# Patient Record
Sex: Male | Born: 1935 | Race: White | Hispanic: No | Marital: Married | Smoking: Former smoker
Health system: Southern US, Community
[De-identification: ages and names within clinical notes are randomized; demographics above are authoritative.]

## PROBLEM LIST (undated history)

## (undated) DIAGNOSIS — I10 Essential (primary) hypertension: Secondary | ICD-10-CM

## (undated) DIAGNOSIS — E119 Type 2 diabetes mellitus without complications: Secondary | ICD-10-CM

## (undated) DIAGNOSIS — Z950 Presence of cardiac pacemaker: Secondary | ICD-10-CM

## (undated) DIAGNOSIS — Z9289 Personal history of other medical treatment: Secondary | ICD-10-CM

## (undated) DIAGNOSIS — N200 Calculus of kidney: Secondary | ICD-10-CM

## (undated) DIAGNOSIS — M1990 Unspecified osteoarthritis, unspecified site: Secondary | ICD-10-CM

## (undated) DIAGNOSIS — F0390 Unspecified dementia without behavioral disturbance: Secondary | ICD-10-CM

## (undated) DIAGNOSIS — Z8739 Personal history of other diseases of the musculoskeletal system and connective tissue: Secondary | ICD-10-CM

## (undated) HISTORY — PX: CYSTOSCOPY W/ STONE MANIPULATION: SHX1427

---

## 2003-08-21 ENCOUNTER — Encounter: Admission: RE | Admit: 2003-08-21 | Discharge: 2003-08-21 | Payer: Self-pay | Admitting: Rheumatology

## 2008-01-14 ENCOUNTER — Emergency Department (HOSPITAL_COMMUNITY): Admission: EM | Admit: 2008-01-14 | Discharge: 2008-01-14 | Payer: Self-pay | Admitting: Emergency Medicine

## 2009-07-08 ENCOUNTER — Encounter: Admission: RE | Admit: 2009-07-08 | Discharge: 2009-07-08 | Payer: Self-pay | Admitting: Family Medicine

## 2010-12-29 LAB — POCT CARDIAC MARKERS
CKMB, poc: <1 — ABNORMAL LOW
Myoglobin, poc: 135
Troponin i, poc: <0.05

## 2010-12-29 LAB — GLUCOSE, CAPILLARY: Glucose-Capillary: 182 — ABNORMAL HIGH

## 2013-03-29 HISTORY — PX: CATARACT EXTRACTION: SUR2

## 2013-10-30 ENCOUNTER — Ambulatory Visit (INDEPENDENT_AMBULATORY_CARE_PROVIDER_SITE_OTHER): Payer: Commercial Managed Care - HMO | Admitting: Cardiovascular Disease

## 2013-10-30 ENCOUNTER — Encounter: Payer: Self-pay | Admitting: Cardiovascular Disease

## 2013-10-30 VITALS — BP 132/70 | HR 80 | Ht 67.0 in | Wt 165.6 lb

## 2013-10-30 DIAGNOSIS — I951 Orthostatic hypotension: Secondary | ICD-10-CM | POA: Insufficient documentation

## 2013-10-30 DIAGNOSIS — Z01810 Encounter for preprocedural cardiovascular examination: Secondary | ICD-10-CM | POA: Insufficient documentation

## 2013-10-30 DIAGNOSIS — E1029 Type 1 diabetes mellitus with other diabetic kidney complication: Secondary | ICD-10-CM | POA: Insufficient documentation

## 2013-10-30 DIAGNOSIS — I4510 Unspecified right bundle-branch block: Secondary | ICD-10-CM

## 2013-10-30 NOTE — Patient Instructions (Signed)
Your physician recommends that you schedule a follow-up appointment in: AS NEEDED  Your physician recommends that you continue on your current medications as directed. Please refer to the Current Medication list given to you today.  

## 2013-10-30 NOTE — Assessment & Plan Note (Signed)
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

## 2013-10-30 NOTE — Assessment & Plan Note (Signed)
Discussed low carb diet.  Target hemoglobin A1c is 6.5 or less.  Continue current medications. Follow Cr  Continue ACE

## 2013-10-30 NOTE — Assessment & Plan Note (Signed)
No significant bradycardia  No CAD or chest pain  Clear to have low risk cataract surgery

## 2013-10-30 NOTE — Assessment & Plan Note (Signed)
Mild avoid dehydration Slow changes in position Related to longstanding DM  No other evidence of dysautonomia

## 2013-10-30 NOTE — Progress Notes (Signed)
Patient ID: Noah Adkins, male   DOB: December 30, 1926, 80 y.o.   MRN: [id number]   80 yo referred by Dr Linton [facility] for clearance cataract surgery.  Diabetic with good control  A1c 5.6  CRF with Cr 1.6  History of HTN  No history of CAD.  Indicates "bradycardia"  Intolerant to multiple calcium blockers and beta blockers.  ? Pulse recorded from last office visit 74 on 09/04/13  He has some mild postural symptoms in the am when he first gets up.  On no AV nodal blocker drugs of significance  Ambulation limited by left knee pain  No chest pain dyspnea or syncope.  He has blurry vision and needs cataract removal.        ROS: Denies fever, malais, weight loss, blurry vision, decreased visual acuity, cough, sputum, SOB, hemoptysis, pleuritic pain, palpitaitons, heartburn, abdominal pain, melena, lower extremity edema, claudication, or rash.  All other systems reviewed and negative   General: Affect appropriate Healthy:  appears stated age HEENT: normal Neck supple with no adenopathy JVP normal no bruits no thyromegaly Lungs clear with no wheezing and good diaphragmatic motion Heart:  S1/S2 no murmur,rub, gallop or click PMI normal Abdomen: benighn, BS positve, no tenderness, no AAA no bruit.  No HSM or HJR Distal pulses intact with no bruits No edema Neuro non-focal Skin warm and dry No muscular weakness  Medications No current outpatient prescriptions on file.   No current facility-administered medications for this visit.    Allergies Review of patient's allergies indicates not on file.  Family History: No family history on file.  Social History: History   Social History  . Marital Status: Married    Spouse Name: N/A    Number of Children: N/A  . Years of Education: N/A   Occupational History  . Not on file.   Social History Main Topics  . Smoking status: Not on file  . Smokeless tobacco: Not on file  . Alcohol Use: Not on file  . Drug Use: Not on file  . Sexual Activity:  Not on file   Other Topics Concern  . Not on file   Social History Narrative  . No narrative on file    Electrocardiogram:SR rate 80 PR 210  RBBB LAFB   Assessment and Plan

## 2014-05-06 DIAGNOSIS — E780 Pure hypercholesterolemia: Secondary | ICD-10-CM | POA: Diagnosis not present

## 2014-05-14 DIAGNOSIS — E780 Pure hypercholesterolemia: Secondary | ICD-10-CM | POA: Diagnosis not present

## 2014-05-14 DIAGNOSIS — M170 Bilateral primary osteoarthritis of knee: Secondary | ICD-10-CM | POA: Diagnosis not present

## 2014-05-14 DIAGNOSIS — Z1389 Encounter for screening for other disorder: Secondary | ICD-10-CM | POA: Diagnosis not present

## 2014-09-11 DIAGNOSIS — G4700 Insomnia, unspecified: Secondary | ICD-10-CM | POA: Diagnosis not present

## 2014-09-11 DIAGNOSIS — E1122 Type 2 diabetes mellitus with diabetic chronic kidney disease: Secondary | ICD-10-CM | POA: Diagnosis not present

## 2014-09-11 DIAGNOSIS — E11329 Type 2 diabetes mellitus with mild nonproliferative diabetic retinopathy without macular edema: Secondary | ICD-10-CM | POA: Diagnosis not present

## 2014-09-11 DIAGNOSIS — J309 Allergic rhinitis, unspecified: Secondary | ICD-10-CM | POA: Diagnosis not present

## 2014-09-11 DIAGNOSIS — N183 Chronic kidney disease, stage 3 (moderate): Secondary | ICD-10-CM | POA: Diagnosis not present

## 2014-09-11 DIAGNOSIS — M170 Bilateral primary osteoarthritis of knee: Secondary | ICD-10-CM | POA: Diagnosis not present

## 2014-09-11 DIAGNOSIS — I129 Hypertensive chronic kidney disease with stage 1 through stage 4 chronic kidney disease, or unspecified chronic kidney disease: Secondary | ICD-10-CM | POA: Diagnosis not present

## 2014-09-11 DIAGNOSIS — N3281 Overactive bladder: Secondary | ICD-10-CM | POA: Diagnosis not present

## 2015-03-14 DIAGNOSIS — I10 Essential (primary) hypertension: Secondary | ICD-10-CM | POA: Diagnosis not present

## 2015-03-14 DIAGNOSIS — E113292 Type 2 diabetes mellitus with mild nonproliferative diabetic retinopathy without macular edema, left eye: Secondary | ICD-10-CM | POA: Diagnosis not present

## 2015-03-14 DIAGNOSIS — M170 Bilateral primary osteoarthritis of knee: Secondary | ICD-10-CM | POA: Diagnosis not present

## 2015-03-14 DIAGNOSIS — E7800 Pure hypercholesterolemia, unspecified: Secondary | ICD-10-CM | POA: Diagnosis not present

## 2015-03-14 DIAGNOSIS — E1122 Type 2 diabetes mellitus with diabetic chronic kidney disease: Secondary | ICD-10-CM | POA: Diagnosis not present

## 2015-03-14 DIAGNOSIS — Z7984 Long term (current) use of oral hypoglycemic drugs: Secondary | ICD-10-CM | POA: Diagnosis not present

## 2015-03-14 DIAGNOSIS — N183 Chronic kidney disease, stage 3 (moderate): Secondary | ICD-10-CM | POA: Diagnosis not present

## 2015-04-14 DIAGNOSIS — M25562 Pain in left knee: Secondary | ICD-10-CM | POA: Diagnosis not present

## 2015-04-14 DIAGNOSIS — M25561 Pain in right knee: Secondary | ICD-10-CM | POA: Diagnosis not present

## 2015-07-08 DIAGNOSIS — M170 Bilateral primary osteoarthritis of knee: Secondary | ICD-10-CM | POA: Diagnosis not present

## 2015-07-08 DIAGNOSIS — J0100 Acute maxillary sinusitis, unspecified: Secondary | ICD-10-CM | POA: Diagnosis not present

## 2015-09-12 DIAGNOSIS — M109 Gout, unspecified: Secondary | ICD-10-CM | POA: Diagnosis not present

## 2015-09-12 DIAGNOSIS — G4700 Insomnia, unspecified: Secondary | ICD-10-CM | POA: Diagnosis not present

## 2015-09-12 DIAGNOSIS — N3281 Overactive bladder: Secondary | ICD-10-CM | POA: Diagnosis not present

## 2015-09-12 DIAGNOSIS — J309 Allergic rhinitis, unspecified: Secondary | ICD-10-CM | POA: Diagnosis not present

## 2015-09-12 DIAGNOSIS — Z1389 Encounter for screening for other disorder: Secondary | ICD-10-CM | POA: Diagnosis not present

## 2015-09-12 DIAGNOSIS — I129 Hypertensive chronic kidney disease with stage 1 through stage 4 chronic kidney disease, or unspecified chronic kidney disease: Secondary | ICD-10-CM | POA: Diagnosis not present

## 2015-09-12 DIAGNOSIS — M170 Bilateral primary osteoarthritis of knee: Secondary | ICD-10-CM | POA: Diagnosis not present

## 2015-09-12 DIAGNOSIS — N183 Chronic kidney disease, stage 3 (moderate): Secondary | ICD-10-CM | POA: Diagnosis not present

## 2015-09-12 DIAGNOSIS — E1122 Type 2 diabetes mellitus with diabetic chronic kidney disease: Secondary | ICD-10-CM | POA: Diagnosis not present

## 2015-10-10 ENCOUNTER — Emergency Department (HOSPITAL_COMMUNITY): Payer: PRIVATE HEALTH INSURANCE

## 2015-10-10 ENCOUNTER — Encounter (HOSPITAL_COMMUNITY)
Admission: EM | Disposition: A | Discharge status: Home / Self Care | Payer: Self-pay | Source: Home / Self Care | Attending: Internal Medicine

## 2015-10-10 ENCOUNTER — Encounter (HOSPITAL_COMMUNITY): Payer: Self-pay | Admitting: *Deleted

## 2015-10-10 ENCOUNTER — Inpatient Hospital Stay (HOSPITAL_COMMUNITY)
Admission: EM | Admit: 2015-10-10 | Discharge: 2015-10-11 | DRG: 244 | Disposition: A | Discharge status: Home / Self Care | Payer: PRIVATE HEALTH INSURANCE | Attending: Internal Medicine | Admitting: Internal Medicine

## 2015-10-10 DIAGNOSIS — I495 Sick sinus syndrome: Secondary | ICD-10-CM

## 2015-10-10 DIAGNOSIS — I10 Essential (primary) hypertension: Secondary | ICD-10-CM | POA: Diagnosis not present

## 2015-10-10 DIAGNOSIS — E119 Type 2 diabetes mellitus without complications: Secondary | ICD-10-CM | POA: Diagnosis not present

## 2015-10-10 DIAGNOSIS — R000 Tachycardia, unspecified: Secondary | ICD-10-CM | POA: Diagnosis not present

## 2015-10-10 DIAGNOSIS — Z7951 Long term (current) use of inhaled steroids: Secondary | ICD-10-CM | POA: Diagnosis not present

## 2015-10-10 DIAGNOSIS — R55 Syncope and collapse: Secondary | ICD-10-CM | POA: Diagnosis not present

## 2015-10-10 DIAGNOSIS — Z7982 Long term (current) use of aspirin: Secondary | ICD-10-CM

## 2015-10-10 DIAGNOSIS — R001 Bradycardia, unspecified: Secondary | ICD-10-CM | POA: Diagnosis not present

## 2015-10-10 DIAGNOSIS — Z7984 Long term (current) use of oral hypoglycemic drugs: Secondary | ICD-10-CM | POA: Diagnosis not present

## 2015-10-10 DIAGNOSIS — R569 Unspecified convulsions: Secondary | ICD-10-CM | POA: Insufficient documentation

## 2015-10-10 DIAGNOSIS — Z950 Presence of cardiac pacemaker: Secondary | ICD-10-CM

## 2015-10-10 DIAGNOSIS — I499 Cardiac arrhythmia, unspecified: Secondary | ICD-10-CM | POA: Diagnosis not present

## 2015-10-10 HISTORY — DX: Personal history of other diseases of the musculoskeletal system and connective tissue: Z87.39

## 2015-10-10 HISTORY — DX: Personal history of other medical treatment: Z92.89

## 2015-10-10 HISTORY — DX: Essential (primary) hypertension: I10

## 2015-10-10 HISTORY — PX: INSERT / REPLACE / REMOVE PACEMAKER: SUR710

## 2015-10-10 HISTORY — DX: Calculus of kidney: N20.0

## 2015-10-10 HISTORY — DX: Presence of cardiac pacemaker: Z95.0

## 2015-10-10 HISTORY — DX: Type 2 diabetes mellitus without complications: E11.9

## 2015-10-10 HISTORY — DX: Unspecified osteoarthritis, unspecified site: M19.90

## 2015-10-10 HISTORY — PX: EP IMPLANTABLE DEVICE: SHX172B

## 2015-10-10 LAB — COMPREHENSIVE METABOLIC PANEL
ALT: 27 U/L (ref 17–63)
AST: 23 U/L (ref 15–41)
Albumin: 4 g/dL (ref 3.5–5.0)
Alkaline Phosphatase: 70 U/L (ref 38–126)
Anion gap: 8 (ref 5–15)
BUN: 34 mg/dL — ABNORMAL HIGH (ref 6–20)
CO2: 28 mmol/L (ref 22–32)
Calcium: 9.4 mg/dL (ref 8.9–10.3)
Chloride: 103 mmol/L (ref 101–111)
Creatinine, Ser: 2.04 mg/dL — ABNORMAL HIGH (ref 0.61–1.24)
GFR calc Af Amer: 32 mL/min — ABNORMAL LOW (ref >60)
GFR calc non Af Amer: 28 mL/min — ABNORMAL LOW (ref >60)
Glucose, Bld: 169 mg/dL — ABNORMAL HIGH (ref 65–99)
Potassium: 3.8 mmol/L (ref 3.5–5.1)
Sodium: 139 mmol/L (ref 135–145)
Total Bilirubin: 1 mg/dL (ref 0.3–1.2)
Total Protein: 6.5 g/dL (ref 6.5–8.1)

## 2015-10-10 LAB — GLUCOSE, CAPILLARY
Glucose-Capillary: 92 mg/dL (ref 65–99)
Glucose-Capillary: 136 mg/dL — ABNORMAL HIGH (ref 65–99)

## 2015-10-10 LAB — MAGNESIUM: Magnesium: 1.9 mg/dL (ref 1.7–2.4)

## 2015-10-10 LAB — CBC WITH DIFFERENTIAL/PLATELET
Basophils Absolute: 0 10*3/uL (ref 0.0–0.1)
Basophils Relative: 0 %
Eosinophils Absolute: 0.2 10*3/uL (ref 0.0–0.7)
Eosinophils Relative: 2 %
HCT: 49.4 % (ref 39.0–52.0)
Hemoglobin: 16.9 g/dL (ref 13.0–17.0)
Lymphocytes Relative: 18 %
Lymphs Abs: 1.6 10*3/uL (ref 0.7–4.0)
MCH: 32.4 pg (ref 26.0–34.0)
MCHC: 34.2 g/dL (ref 30.0–36.0)
MCV: 94.8 fL (ref 78.0–100.0)
Monocytes Absolute: 0.5 10*3/uL (ref 0.1–1.0)
Monocytes Relative: 5 %
Neutro Abs: 6.8 10*3/uL (ref 1.7–7.7)
Neutrophils Relative %: 75 %
Platelets: 169 10*3/uL (ref 150–400)
RBC: 5.21 MIL/uL (ref 4.22–5.81)
RDW: 13.5 % (ref 11.5–15.5)
WBC: 9 10*3/uL (ref 4.0–10.5)

## 2015-10-10 LAB — I-STAT TROPONIN, ED: Troponin i, poc: 0.02 ng/mL (ref 0.00–0.08)

## 2015-10-10 LAB — TSH: TSH: 0.5 u[IU]/mL (ref 0.350–4.500)

## 2015-10-10 SURGERY — PACEMAKER IMPLANT
Anesthesia: LOCAL

## 2015-10-10 MED ORDER — LIDOCAINE HCL (PF) 1 % IJ SOLN
INTRAMUSCULAR | Status: DC | PRN
  Administered 2015-10-10: 40 mL via INTRADERMAL

## 2015-10-10 MED ORDER — LIDOCAINE HCL (PF) 1 % IJ SOLN
INTRAMUSCULAR | Status: AC
  Filled 2015-10-10: qty 30

## 2015-10-10 MED ORDER — FENTANYL CITRATE (PF) 100 MCG/2ML IJ SOLN
INTRAMUSCULAR | Status: DC | PRN
  Administered 2015-10-10 (×2): 12.5 ug via INTRAVENOUS

## 2015-10-10 MED ORDER — FENTANYL CITRATE (PF) 100 MCG/2ML IJ SOLN
INTRAMUSCULAR | Status: AC
  Filled 2015-10-10: qty 2

## 2015-10-10 MED ORDER — ANGIOPLASTY BOOK
Freq: Once | Status: AC
  Filled 2015-10-10: qty 1

## 2015-10-10 MED ORDER — SODIUM CHLORIDE 0.9 % IR SOLN
80.0000 mg | Status: DC
  Filled 2015-10-10: qty 2

## 2015-10-10 MED ORDER — HEPARIN (PORCINE) IN NACL 2-0.9 UNIT/ML-% IJ SOLN
INTRAMUSCULAR | Status: AC
  Filled 2015-10-10: qty 500

## 2015-10-10 MED ORDER — SODIUM CHLORIDE 0.9 % IV SOLN: 250.0000 mL | INTRAVENOUS | Status: DC

## 2015-10-10 MED ORDER — ALLOPURINOL 300 MG PO TABS
300.0000 mg | ORAL_TABLET | Freq: Every day | ORAL | Status: DC
  Administered 2015-10-11: 300 mg via ORAL
  Filled 2015-10-10: qty 1

## 2015-10-10 MED ORDER — SODIUM CHLORIDE 0.9 % IR SOLN
Status: AC
  Filled 2015-10-10: qty 2

## 2015-10-10 MED ORDER — CHLORHEXIDINE GLUCONATE 4 % EX LIQD: 60.0000 mL | Freq: Once | CUTANEOUS | Status: AC

## 2015-10-10 MED ORDER — ONDANSETRON HCL 4 MG/2ML IJ SOLN: 4.0000 mg | Freq: Four times a day (QID) | INTRAMUSCULAR | Status: DC | PRN

## 2015-10-10 MED ORDER — NITROGLYCERIN 0.4 MG SL SUBL: 0.4000 mg | SUBLINGUAL_TABLET | SUBLINGUAL | Status: DC | PRN

## 2015-10-10 MED ORDER — OXYCODONE-ACETAMINOPHEN 5-325 MG PO TABS: 1.0000 | ORAL_TABLET | Freq: Four times a day (QID) | ORAL | Status: DC | PRN

## 2015-10-10 MED ORDER — HYDROCHLOROTHIAZIDE 25 MG PO TABS
25.0000 mg | ORAL_TABLET | Freq: Every day | ORAL | Status: DC
  Administered 2015-10-11: 25 mg via ORAL
  Filled 2015-10-10: qty 1

## 2015-10-10 MED ORDER — ACETAMINOPHEN 325 MG PO TABS: 325.0000 mg | ORAL_TABLET | ORAL | Status: DC | PRN

## 2015-10-10 MED ORDER — SODIUM CHLORIDE 0.9% FLUSH: 3.0000 mL | INTRAVENOUS | Status: DC | PRN

## 2015-10-10 MED ORDER — CEFAZOLIN SODIUM-DEXTROSE 2-4 GM/100ML-% IV SOLN: 2.0000 g | INTRAVENOUS | Status: DC

## 2015-10-10 MED ORDER — TRAZODONE HCL 50 MG PO TABS
100.0000 mg | ORAL_TABLET | Freq: Every day | ORAL | Status: DC
  Administered 2015-10-10: 100 mg via ORAL
  Filled 2015-10-10: qty 2

## 2015-10-10 MED ORDER — MIDAZOLAM HCL 5 MG/5ML IJ SOLN
INTRAMUSCULAR | Status: DC | PRN
  Administered 2015-10-10 (×2): 1 mg via INTRAVENOUS

## 2015-10-10 MED ORDER — YOU HAVE A PACEMAKER BOOK
Freq: Once | Status: AC
  Administered 2015-10-10: 22:00:00
  Filled 2015-10-10: qty 1

## 2015-10-10 MED ORDER — FELODIPINE ER 5 MG PO TB24
5.0000 mg | ORAL_TABLET | Freq: Every day | ORAL | Status: DC
  Administered 2015-10-11: 5 mg via ORAL
  Filled 2015-10-10: qty 1

## 2015-10-10 MED ORDER — CEFAZOLIN SODIUM-DEXTROSE 2-3 GM-% IV SOLR
INTRAVENOUS | Status: DC | PRN
  Administered 2015-10-10: 2 g via INTRAVENOUS

## 2015-10-10 MED ORDER — SODIUM CHLORIDE 0.9 % IV SOLN
INTRAVENOUS | Status: DC
  Administered 2015-10-10: 19:00:00 via INTRAVENOUS

## 2015-10-10 MED ORDER — LISINOPRIL-HYDROCHLOROTHIAZIDE 20-25 MG PO TABS: 1.0000 | ORAL_TABLET | Freq: Every day | ORAL | Status: DC

## 2015-10-10 MED ORDER — ACETAMINOPHEN 325 MG PO TABS: 650.0000 mg | ORAL_TABLET | ORAL | Status: DC | PRN

## 2015-10-10 MED ORDER — ACETAMINOPHEN-CAFFEINE 500-65 MG PO TABS: 2.0000 | ORAL_TABLET | Freq: Every day | ORAL | Status: DC | PRN

## 2015-10-10 MED ORDER — ASPIRIN EC 81 MG PO TBEC
81.0000 mg | DELAYED_RELEASE_TABLET | Freq: Every day | ORAL | Status: DC
  Administered 2015-10-11: 10:00:00 81 mg via ORAL
  Filled 2015-10-10: qty 1

## 2015-10-10 MED ORDER — MIDAZOLAM HCL 5 MG/5ML IJ SOLN
INTRAMUSCULAR | Status: AC
  Filled 2015-10-10: qty 5

## 2015-10-10 MED ORDER — LISINOPRIL 10 MG PO TABS
20.0000 mg | ORAL_TABLET | Freq: Every day | ORAL | Status: DC
  Administered 2015-10-11: 10:00:00 20 mg via ORAL
  Filled 2015-10-10 (×2): qty 2

## 2015-10-10 MED ORDER — INSULIN ASPART 100 UNIT/ML ~~LOC~~ SOLN
0.0000 [IU] | Freq: Three times a day (TID) | SUBCUTANEOUS | Status: DC
  Administered 2015-10-11: 07:00:00 1 [IU] via SUBCUTANEOUS

## 2015-10-10 MED ORDER — ATROPINE SULFATE 1 MG/ML IJ SOLN
0.4000 mg | Freq: Once | INTRAMUSCULAR | Status: DC
  Filled 2015-10-10: qty 1

## 2015-10-10 MED ORDER — CEFAZOLIN SODIUM-DEXTROSE 2-4 GM/100ML-% IV SOLN
INTRAVENOUS | Status: AC
  Filled 2015-10-10: qty 100

## 2015-10-10 MED ORDER — SODIUM CHLORIDE 0.9% FLUSH: 3.0000 mL | Freq: Two times a day (BID) | INTRAVENOUS | Status: DC

## 2015-10-10 MED ORDER — HEPARIN (PORCINE) IN NACL 2-0.9 UNIT/ML-% IJ SOLN
INTRAMUSCULAR | Status: DC | PRN
  Administered 2015-10-10: 16:00:00

## 2015-10-10 MED ORDER — FLUTICASONE PROPIONATE 50 MCG/ACT NA SUSP
1.0000 | Freq: Every day | NASAL | Status: DC
  Administered 2015-10-11: 1 via NASAL
  Filled 2015-10-10: qty 16

## 2015-10-10 MED ORDER — SODIUM CHLORIDE 0.9 % IR SOLN
Status: DC | PRN
  Administered 2015-10-10: 17:00:00

## 2015-10-10 MED ORDER — GLIPIZIDE 5 MG PO TABS
2.5000 mg | ORAL_TABLET | Freq: Every day | ORAL | Status: DC
  Administered 2015-10-11: 10:00:00 2.5 mg via ORAL
  Filled 2015-10-10: qty 1

## 2015-10-10 MED ORDER — CEFAZOLIN IN D5W 1 GM/50ML IV SOLN
1.0000 g | Freq: Four times a day (QID) | INTRAVENOUS | Status: AC
  Administered 2015-10-10 – 2015-10-11 (×3): 1 g via INTRAVENOUS
  Filled 2015-10-10 (×3): qty 50

## 2015-10-10 MED ORDER — TRAMADOL HCL 50 MG PO TABS
50.0000 mg | ORAL_TABLET | Freq: Two times a day (BID) | ORAL | Status: DC | PRN
  Administered 2015-10-11: 50 mg via ORAL
  Filled 2015-10-10: qty 1

## 2015-10-10 SURGICAL SUPPLY — 7 items
CABLE SURGICAL S-101-97-12 (CABLE) ×1 IMPLANT
LEAD SOLIA S PRO MRI 45 (Lead) ×1 IMPLANT
LEAD SOLIA S PRO MRI 53 (Lead) ×1 IMPLANT
PAD DEFIB LIFELINK (PAD) ×1 IMPLANT
PPM ELUNA DR-T 394969 (Pacemaker) ×1 IMPLANT
SHEATH CLASSIC 7F (SHEATH) ×2 IMPLANT
TRAY PACEMAKER INSERTION (PACKS) ×1 IMPLANT

## 2015-10-10 NOTE — Discharge Summary (Signed)
ELECTROPHYSIOLOGY PROCEDURE DISCHARGE SUMMARY    Patient ID: Noah Adkins,  MRN: [id number], DOB/AGE: Oct 20, 1926 82 y.o.  Admit date: 10/10/2015 Discharge date: 10/11/2015  Primary Care Physician: Leanor RubensteinSUN,VYVYAN Y, MD Electrophysiologist: Ladona Ridgelaylor  Primary Discharge Diagnosis:  Symptomatic sick sinus syndrome and syncope status post pacemaker implantation this admission  Secondary Discharge Diagnosis:  1.  HTN 2.  DM 3.  Arthritis  Allergies  Allergen Reactions  . Atenolol Other (See Comments)    Dizzy, nausea, headache  . Dilacor Xr [Diltiazem Hcl] Other (See Comments)    NOT EFFECTIVE  . Isoptin Sr [Verapamil Hcl Er]     Erectile dysfunction  . Lopressor [Metoprolol Tartrate] Other (See Comments)    Nausea, dizzy, headache  . Norvasc [Amlodipine Besylate] Other (See Comments)    Headache and sexual dysfunction     Procedures This Admission:  1.  Implantation of a Biotronik dual chamber PPM on 10/10/15 by Dr Ladona Ridgelaylor.  See op note for details. There were no immediate post procedure complications. 2.  CXR on 10/11/15 demonstrated no pneumothorax status post device implantation.   Brief HPI/Hospital Course: Noah Adkins is a 82 y.o. male presented to the ER with syncope and symptomatic sinus node dysfunction without reversible causes identified.   Risks, benefits, and alternatives to PPM implantation were reviewed with the patient who wished to proceed.  The patient underwent implantation of a Biotronik dual chamber PPM with details as outlined above.  He was monitored on telemetry overnight which demonstrated pacing.  Left chest was without hematoma or ecchymosis.  The device was interrogated and found to be functioning normally.  CXR was obtained and demonstrated no pneumothorax status post device implantation.  Wound care, arm mobility, and restrictions were reviewed with the patient.  The patient was examined and considered stable for discharge to home.    Physical  Exam: Filed Vitals:   10/10/15 1900 10/10/15 2000 10/11/15 0211 10/11/15 0733  BP: 145/76 145/80 151/72   Pulse:  67 69   Temp:  98 F (36.7 C) 97.7 F (36.5 C) 97.8 F (36.6 C)  TempSrc:  Oral Oral Oral  Resp: 16 14 13    Weight:   167 lb 8.8 oz (76 kg)   SpO2:  95% 96%     GEN- The patient is well appearing, alert and oriented x 3 today.   HEENT: normocephalic, atraumatic; sclera clear, conjunctiva pink; hearing intact; oropharynx clear; neck supple, no JVP Lymph- no cervical lymphadenopathy Lungs- Clear to ausculation bilaterally, normal work of breathing.  No wheezes, rales, rhonchi Heart- Regular rate and rhythm, no murmurs, rubs or gallops, PMI not laterally displaced GI- soft, non-tender, non-distended, bowel sounds present, no hepatosplenomegaly Extremities- no clubbing, cyanosis, or edema; DP/PT/radial pulses 2+ bilaterally MS- no significant deformity or atrophy Skin- warm and dry, no rash or lesion, left chest without hematoma/ecchymosis Psych- euthymic mood, full affect Neuro- strength and sensation are intact   Labs:   Lab Results  Component Value Date   WBC 9.0 10/10/2015   HGB 16.9 10/10/2015   HCT 49.4 10/10/2015   MCV 94.8 10/10/2015   PLT 169 10/10/2015     Recent Labs Lab 10/10/15 1234 10/11/15 0430  NA 139 138  K 3.8 3.6  CL 103 102  CO2 28 27  BUN 34* 30*  CREATININE 2.04* 1.82*  CALCIUM 9.4 8.9  PROT 6.5  --   BILITOT 1.0  --   ALKPHOS 70  --   ALT 27  --  AST 23  --   GLUCOSE 169* 143*    Discharge Medications:    Medication List    TAKE these medications        allopurinol 300 MG tablet  Commonly known as:  ZYLOPRIM  Take 300 mg by mouth daily.     aspirin EC 81 MG tablet  Take 81 mg by mouth daily.     EXCEDRIN TENSION HEADACHE 500-65 MG Tabs  Generic drug:  Acetaminophen-Caffeine  Take 2 tablets by mouth daily as needed (headache).     felodipine 5 MG 24 hr tablet  Commonly known as:  PLENDIL  Take 5 mg by mouth  daily.     fluticasone 50 MCG/ACT nasal spray  Commonly known as:  FLONASE  Place 1 spray into both nostrils daily.     glipiZIDE 5 MG tablet  Commonly known as:  GLUCOTROL  Take 2.5 mg by mouth daily.     lisinopril-hydrochlorothiazide 20-25 MG tablet  Commonly known as:  PRINZIDE,ZESTORETIC  Take 1 tablet by mouth daily.     oxyCODONE-acetaminophen 5-325 MG tablet  Commonly known as:  PERCOCET/ROXICET  Take 1 tablet by mouth every 6 (six) hours as needed (pain).     solifenacin 10 MG tablet  Commonly known as:  VESICARE  Take 10 mg by mouth at bedtime.     traMADol 50 MG tablet  Commonly known as:  ULTRAM  Take 50 mg by mouth 2 (two) times daily as needed (pain).     traZODone 100 MG tablet  Commonly known as:  DESYREL  Take 100 mg by mouth at bedtime.        Disposition:   Follow-up Information    Follow up with [facility] On 10/21/2015.   Specialty:  Cardiology   Why:  9:00AM, wound check   Contact information:   [address], [address] [place] [address] [phone]      Follow up with Lewayne Bunting, MD.   Specialty:  Cardiology   Why:  an office scheduler will call you to arrange a follow up appointment with Dr. Rosine Beat information:   [address] [place] [address] [phone]       Duration of Discharge Encounter: Greater than 30 minutes including physician time.  Randolm Idol MD, [facility] 10/11/2015 11:06 AM

## 2015-10-10 NOTE — ED Notes (Signed)
Called lab and spoke with Velna HatchetSheila to add on magnesium.

## 2015-10-10 NOTE — ED Provider Notes (Signed)
CSN: [id number]     Arrival date & time 10/10/15  1220 History   First MD Initiated Contact with Patient 10/10/15 1237     Chief Complaint  Patient presents with  . Bradycardia     (Consider location/radiation/quality/duration/timing/severity/associated sxs/prior Treatment) HPI Comments: Patient is an 82 year old male with no significant cardiac history who presents with bradycardia following an episode that occurred this morning. The patient's wife describes the episode as initial headache lightheadedness, and a shaking episode, followed by the patient falling over in the chair with a blank stare. The patient has no memory of this incident and stated that he was "fine" afterwards. Patient does not have any symptoms right now and denies any chest pain, shortness of breath, lightheadedness, headache, abdominal pain, nausea, vomiting. The patient states that he did have an episode of bradycardia 2 to 3 years ago during his cataract surgery. He saw cardiology and was cleared and was told nothing was wrong. Patient has been taking tramadol for the past 2 weeks for sleep prescribed by his primary care provider, otherwise he has been taking no new medications. Patient reports that he takes his blood pressure and pulse daily and his pulse normally runs in the 60s. Patient even took his pulse earlier this morning prior to episode and it was in the 60s.  The history is provided by the patient and the spouse.    Past Medical History  Diagnosis Date  . Hypertension   . Arthritis   . Diabetes [facility])    Past Surgical History  Procedure Laterality Date  . Cataract extraction     Family History  Problem Relation Age of Onset  . Hypertension Father   . Diabetes Mother    Social History  Substance Use Topics  . Smoking status: Former Games developer  . Smokeless tobacco: None  . Alcohol Use: No    Review of Systems  Constitutional: Negative for fever and chills.  HENT: Negative for facial swelling.    Respiratory: Negative for shortness of breath.   Cardiovascular: Negative for chest pain.  Gastrointestinal: Negative for nausea, vomiting and abdominal pain.  Genitourinary: Negative for dysuria.  Musculoskeletal: Negative for back pain.  Skin: Negative for rash and wound.  Neurological: Positive for light-headedness (surrounding episode) and headaches (surrounding episode).  Psychiatric/Behavioral: The patient is not nervous/anxious.       Allergies  Atenolol; Dilacor xr; Isoptin sr; Lopressor; Norvasc; and Tramadol  Home Medications   Prior to Admission medications   Medication Sig Start Date End Date Taking? Authorizing Provider  Acetaminophen-Caffeine (EXCEDRIN TENSION HEADACHE) 500-65 MG TABS Take 2 tablets by mouth daily as needed (headache).   Yes Historical Provider, MD  allopurinol (ZYLOPRIM) 300 MG tablet Take 300 mg by mouth daily.  09/30/13  Yes Historical Provider, MD  aspirin EC 81 MG tablet Take 81 mg by mouth daily.   Yes Historical Provider, MD  felodipine (PLENDIL) 5 MG 24 hr tablet 5 mg daily.  09/17/13  Yes Historical Provider, MD  glipiZIDE (GLUCOTROL XL) 2.5 MG 24 hr tablet Take 2.5 mg by mouth daily with breakfast.   Yes Historical Provider, MD  lisinopril-hydrochlorothiazide (PRINZIDE,ZESTORETIC) 20-25 MG per tablet Take 1 tablet by mouth.  09/17/13  Yes Historical Provider, MD  traMADol (ULTRAM) 50 MG tablet Take 50 mg by mouth 2 (two) times daily as needed (pain).   Yes Historical Provider, MD  fluticasone Aleda Grana) 50 MCG/ACT nasal spray  09/17/13   Historical Provider, MD  tamsulosin (FLOMAX) 0.4 MG CAPS capsule  08/13/13   Historical Provider, MD  traZODone (DESYREL) 100 MG tablet Take 100 mg by mouth at bedtime. 09/12/15   Historical Provider, MD  VESICARE 10 MG tablet  09/17/13   Historical Provider, MD   BP 128/85 mmHg  Pulse 33  Temp(Src) 97.6 F (36.4 C) (Oral)  Resp 16  SpO2 98% Physical Exam  Constitutional: He appears well-developed and  well-nourished. No distress.  HENT:  Head: Normocephalic and atraumatic.  Mouth/Throat: Oropharynx is clear and moist. No oropharyngeal exudate.  Eyes: Conjunctivae are normal. Pupils are equal, round, and reactive to light. Right eye exhibits no discharge. Left eye exhibits no discharge. No scleral icterus.  Neck: Normal range of motion. Neck supple. No thyromegaly present.  Cardiovascular: Regular rhythm, normal heart sounds and intact distal pulses.  Bradycardia present.  Exam reveals no gallop and no friction rub.   No murmur heard. Pulses:      Radial pulses are 2+ on the right side, and 2+ on the left side.       Dorsalis pedis pulses are 2+ on the right side, and 2+ on the left side.  Pulmonary/Chest: Effort normal and breath sounds normal. No stridor. No respiratory distress. He has no wheezes. He has no rales.  Abdominal: Soft. Bowel sounds are normal. He exhibits no distension. There is no tenderness. There is no rebound and no guarding.  Musculoskeletal: He exhibits no edema.  Lymphadenopathy:    He has no cervical adenopathy.  Neurological: He is alert. Coordination normal.  Skin: Skin is warm and dry. No rash noted. He is not diaphoretic. No pallor.  Psychiatric: He has a normal mood and affect.  Nursing note and vitals reviewed.   ED Course  Procedures (including critical care time) Labs Review Labs Reviewed  COMPREHENSIVE METABOLIC PANEL - Abnormal; Notable for the following:    Glucose, Bld 169 (*)    BUN 34 (*)    Creatinine, Ser 2.04 (*)    GFR calc non Af Amer 28 (*)    GFR calc Af Amer 32 (*)    All other components within normal limits  CBC WITH DIFFERENTIAL/PLATELET  MAGNESIUM  TSH  I-STAT TROPOININ, ED    Imaging Review Dg Chest 2 View  10/10/2015  CLINICAL DATA:  Tachycardia and weakness. EXAM: CHEST  2 VIEW COMPARISON:  07/08/2009 FINDINGS: The heart size and mediastinal contours are within normal limits. Aortic atherosclerosis noted. Both lungs are  clear. The visualized skeletal structures are unremarkable. IMPRESSION: No active cardiopulmonary disease. Electronically Signed   By: Signa Kellaylor  Stroud M.D.   On: 10/10/2015 13:53   I have personally reviewed and evaluated these images and lab results as part of my medical decision-making.   EKG Interpretation   Date/Time:  Friday October 10 2015 12:27:34 EDT Ventricular Rate:  40 PR Interval:    QRS Duration: 145 QT Interval:  540 QTC Calculation: 441 R Axis:   -75 Text Interpretation:  Bradycardia with irregular rate Right bundle branch  block Anterior infarct, old no old ekg to compared Confirmed by Rhunette CroftNANAVATI,  MD, Janey GentaANKIT [phone]([id number]) on 10/10/2015 12:42:52 PM      MDM   Patient presenting with most likely syncopal seizure. Back to baseline mental status on exam. EKG shows bradycardia with irregular rate, right bundle branch block. Troponin 0.02. He needs a 1.9. CBC unremarkable. CMP shows glucose 169, BUN 34, creatinine 2.04. CXR shows no active cardiopulmonary disease. Patient put on pad cardiac monitoring. Patient evaluated by cardiology early in ED course  who will admit the patient for further evaluation and treatment of the patient's symptomatic sudden onset bradycardia and its etiology. Patient also evaluated by Dr. Rhunette Croft who is in agreement with plan.   Final diagnoses:  Symptomatic bradycardia  Syncopal seizure [facility])        Emi Holes, PA-C 10/10/15 1521  Derwood Kaplan, MD 10/12/15 1910

## 2015-10-10 NOTE — H&P (Signed)
H&P    Patient ID: Noah Adkins MRN: [id number], DOB/AGE: 80-Aug-1928 80 y.o.  Admit date: 10/10/2015 Date of Admit: 10/10/2015  Primary Physician: No primary care provider on file. Primary Cardiologist: Dr. Eden Emms [phone] for pre-op clearance)  Reason for Admit: syncope, bradycardia   HPI: Noah Adkins is a 82 y.o. male with PMHx of HTN, DM, significant arthritis comes [facility] via EMS after 2 syncopal events witnessed by family.  He found with HR 30's and has been observed in the ED to dip in the 20's by report.  The patient has been feeling in his usual state of health of late, no particular illness or symptoms of any kind, was at his computer today, sitting with his wife when he suddenly slumped over and seemed to shake with a blank look/stare, his wife feels it lasted a minute or so, he came to on his own, did not fall to the floor or have injury.  He woke without symptoms and told her he felt fine.  About 30 minutes or so later with his daughter present the same thing happened and they called 911.  The patient denies any symptoms out side of these events.  No CP, palpitations or SOB, he has not been having any dizzy spells, lightheadeness or otherwise.  His activities are significantly limited by his arthritis so not very active.  He is remains asymptomatic at this time, lying on a stretcher with HR 30-35bpm, BP 151/65, afebrile  LABS: K+ 3.8 BUN/Creat 24/2.04 poc Trop <0.05 Trop I: 0.02 H/H 16/49 WBC 9.0 plts 169  Past Medical History  Diagnosis Date  . Hypertension   . Arthritis   . Diabetes [facility])      Surgical History:  Past Surgical History  Procedure Laterality Date  . Cataract extraction        (Not in a hospital admission)  Inpatient Medications:  . atropine  0.4 mg Intravenous Once    Allergies:  Allergies  Allergen Reactions  . Atenolol Other (See Comments)    DIZZY, NAUSEA, AND HEADACHE  . Dilacor Xr [Diltiazem Hcl] Other (See Comments)    NOT  EFFECTIVE  . Isoptin Sr [Verapamil Hcl Er]     ED  . Lopressor [Metoprolol Tartrate] Other (See Comments)    NAUSEA , DIZZY ,AND  HEAD ACHE  . Norvasc [Amlodipine Besylate] Other (See Comments)    HEADACHE  AND  SEXUAL  DYSFUNCTION  . Tramadol Nausea Only    Social History   Social History  . Marital Status: Married    Spouse Name: N/A  . Number of Children: N/A  . Years of Education: N/A   Occupational History  . Not on file.   Social History Main Topics  . Smoking status: Former Games developer  . Smokeless tobacco: Not on file  . Alcohol Use: No  . Drug Use: No  . Sexual Activity: Not on file   Other Topics Concern  . Not on file   Social History Narrative     Family History  Problem Relation Age of Onset  . Hypertension Father   . Diabetes Mother      Review of Systems: All other systems reviewed and are otherwise negative except as noted above.  Physical Exam: Filed Vitals:   10/10/15 1245 10/10/15 1300 10/10/15 1345 10/10/15 1400  BP: 131/57 145/65 146/68 142/60  Pulse: 46 38 35 47  Temp:      TempSrc:      Resp: 18 15 13  14  SpO2: 96% 97% 98% 98%    GEN- The patient is well appearing, alert and oriented x 3 today.   HEENT: normocephalic, atraumatic; sclera clear, conjunctiva pink; hearing intact; oropharynx clear; neck supple, no JVP Lymph- no cervical lymphadenopathy Lungs- Clear to ausculation bilaterally, normal work of breathing.  No wheezes, rales, rhonchi Heart- Regular rate and rhythm, bradycardic, no significant murmurs, rubs or gallops, PMI not laterally displaced GI- soft, non-tender, non-distended Extremities- no clubbing, cyanosis, or edema MS- no significant deformity, age appropriate atrophy Skin- warm and dry, no rash or lesion Psych- euthymic mood, full affect Neuro- no gross deficits observed  Labs:   Lab Results  Component Value Date   WBC 9.0 10/10/2015   HGB 16.9 10/10/2015   HCT 49.4 10/10/2015   MCV 94.8 10/10/2015   PLT  169 10/10/2015     Recent Labs Lab 10/10/15 1234  NA 139  K 3.8  CL 103  CO2 28  BUN 34*  CREATININE 2.04*  CALCIUM 9.4  PROT 6.5  BILITOT 1.0  ALKPHOS 70  ALT 27  AST 23  GLUCOSE 169*      Radiology/Studies:  Dg Chest 2 View 10/10/2015  CLINICAL DATA:  Tachycardia and weakness. EXAM: CHEST  2 VIEW COMPARISON:  07/08/2009 FINDINGS: The heart size and mediastinal contours are within normal limits. Aortic atherosclerosis noted. Both lungs are clear. The visualized skeletal structures are unremarkable. IMPRESSION: No active cardiopulmonary disease. Electronically Signed   By: Signa Kellaylor  Stroud M.D.   On: 10/10/2015 13:53    EKG: SB 40bpm, RBBB TELEMETRY: SB 30's, reportedly observed to dip high 20's   Assessment and Plan:   1. Syncope     Profound bradycardia     On no rate limiting/nodal blocking medicines at home     Will get stat TSH, plan for PPM implant PPM implant procedure as well as risks/benefits of the procedure were discussed with the patient and family at bedside, by Dr. Ladona Ridgelaylor and myself, the patient is agreeable to proceed.  2. HTN     Home meds post pacer     Hold his ACE for now  3. DM     AC/HS sliding scale  4. ARI     No historical labs available     He denies any known renal history     Likely secondary to bradycardia     BMET in the AM  5. Sinus node dysfunction - plan PPM   Signed, Francis DowseRenee Ursuy, PA-C 10/10/2015 2:33 PM  EP Attending  Patient seen and examined. Agree with above. He has symptomatic bradycardia due to sinus node dysfunction in the setting of conduction system disease as well. Will plan to proceed with PPM insertion. I have discussed the risks/benefits/goals/expectations of the procedure and he wishes to proceed.  Leonia ReevesGregg Taylor,M.D.

## 2015-10-10 NOTE — ED Notes (Signed)
Pt arrives from home via [facility]. Pt had 2 episodes of shaking today that only lasted a couple of seconds then didn't recognize his wife afterward for a brief period. Pt states he used to be a diabetic, but he has been taken off his medications now. Pt took his cbg this morning saw it was high at 225 and took half a glipizide. Pt has no hx of seizures or cardiac hx. Pt hr was 35 upon EMS arrival.

## 2015-10-11 ENCOUNTER — Inpatient Hospital Stay (HOSPITAL_COMMUNITY): Payer: PRIVATE HEALTH INSURANCE

## 2015-10-11 DIAGNOSIS — R55 Syncope and collapse: Secondary | ICD-10-CM | POA: Insufficient documentation

## 2015-10-11 DIAGNOSIS — R569 Unspecified convulsions: Secondary | ICD-10-CM | POA: Insufficient documentation

## 2015-10-11 DIAGNOSIS — I495 Sick sinus syndrome: Secondary | ICD-10-CM | POA: Diagnosis not present

## 2015-10-11 DIAGNOSIS — I10 Essential (primary) hypertension: Secondary | ICD-10-CM | POA: Diagnosis not present

## 2015-10-11 DIAGNOSIS — Z950 Presence of cardiac pacemaker: Secondary | ICD-10-CM | POA: Diagnosis not present

## 2015-10-11 DIAGNOSIS — Z7984 Long term (current) use of oral hypoglycemic drugs: Secondary | ICD-10-CM | POA: Diagnosis not present

## 2015-10-11 DIAGNOSIS — E119 Type 2 diabetes mellitus without complications: Secondary | ICD-10-CM | POA: Diagnosis not present

## 2015-10-11 DIAGNOSIS — Z7951 Long term (current) use of inhaled steroids: Secondary | ICD-10-CM | POA: Diagnosis not present

## 2015-10-11 DIAGNOSIS — Z7982 Long term (current) use of aspirin: Secondary | ICD-10-CM | POA: Diagnosis not present

## 2015-10-11 LAB — BASIC METABOLIC PANEL
Anion gap: 9 (ref 5–15)
BUN: 30 mg/dL — ABNORMAL HIGH (ref 6–20)
CO2: 27 mmol/L (ref 22–32)
Calcium: 8.9 mg/dL (ref 8.9–10.3)
Chloride: 102 mmol/L (ref 101–111)
Creatinine, Ser: 1.82 mg/dL — ABNORMAL HIGH (ref 0.61–1.24)
GFR calc Af Amer: 37 mL/min — ABNORMAL LOW (ref >60)
GFR calc non Af Amer: 32 mL/min — ABNORMAL LOW (ref >60)
Glucose, Bld: 143 mg/dL — ABNORMAL HIGH (ref 65–99)
Potassium: 3.6 mmol/L (ref 3.5–5.1)
Sodium: 138 mmol/L (ref 135–145)

## 2015-10-11 LAB — GLUCOSE, CAPILLARY: Glucose-Capillary: 142 mg/dL — ABNORMAL HIGH (ref 65–99)

## 2015-10-11 LAB — ECHOCARDIOGRAM COMPLETE: Weight: 2680.79 oz

## 2015-10-11 NOTE — Discharge Instructions (Signed)
° ° °  Supplemental Discharge Instructions for  Pacemaker Patients  Activity No heavy lifting or vigorous activity with your left/right arm for 6 to 8 weeks.  Do not raise your left arm above your head for one week.  Gradually raise your affected arm as drawn below.             10/14/15                     10/15/15                    10/16/15                  10/17/15 __  NO DRIVING for  1 week    ; you may begin driving on  9/60/457/21/17   .  WOUND CARE - Keep the wound area clean and dry.  Do not get this area wet for one week. No showers for one week; you may shower on 10/17/15    . - The tape/steri-strips on your wound will fall off; do not pull them off.  No bandage is needed on the site.  DO  NOT apply any creams, oils, or ointments to the wound area. - If you notice any drainage or discharge from the wound, any swelling or bruising at the site, or you develop a fever > 101? F after you are discharged home, call the office at once.  Special Instructions - You are still able to use cellular telephones; use the ear opposite the side where you have your pacemaker/defibrillator.  Avoid carrying your cellular phone near your device. - When traveling through airports, show security personnel your identification card to avoid being screened in the metal detectors.  Ask the security personnel to use the hand wand. - Avoid arc welding equipment, MRI testing (magnetic resonance imaging), TENS units (transcutaneous nerve stimulators).  Call the office for questions about other devices. - Avoid electrical appliances that are in poor condition or are not properly grounded. - Microwave ovens are safe to be near or to operate.

## 2015-10-11 NOTE — Progress Notes (Signed)
Echocardiogram 2D Echocardiogram has been performed.  Dorothey BasemanReel, Pennie Vanblarcom M 10/11/2015, 10:17 AM

## 2015-10-12 ENCOUNTER — Telehealth: Payer: Self-pay | Admitting: Physician Assistant

## 2015-10-12 NOTE — Telephone Encounter (Signed)
Paged by answering serving, the patient had "very mild" swelling and bruise at pacemaker site. No fever, chills, erythema or put coming out. No TTP. Advice to take tylenol if pain. If worsening edema, bruise of redness --> hold aspirin and call office. The patient and wife agrees with plan.

## 2015-10-13 ENCOUNTER — Encounter (HOSPITAL_COMMUNITY): Payer: Self-pay | Admitting: Internal Medicine

## 2015-10-13 ENCOUNTER — Telehealth: Payer: Self-pay | Admitting: Internal Medicine

## 2015-10-13 NOTE — Telephone Encounter (Signed)
Returned Mrs. Gortney's call.  Patient in the background, discussing with his wife.  Per patient's wife, site swelling (slight per patient) has been stable since discharge from the hospital.  She states it is slightly red--also stable since discharge--but denies drainage or bruising.  She wants to know if it is ok for the patient to take his ASA 81mg .  Advised that patient should resume his ASA per his discharge instructions.  Swelling is minimal per patient and wife and is not causing any discomfort.  She states that she is not concerned about the swelling, just that she wants to make sure he can resume his ASA.  Offered device clinic appointment if site swelling or discoloration is worsening, or if patient develops signs/symptoms of hematoma or infection.  Patient's wife aware to call if he develops any of these signs/symptoms.  She is appreciative of call and denies additional questions or concerns at this time.

## 2015-10-13 NOTE — Telephone Encounter (Signed)
New message     Pt got a pacemaker on last Friday.   Wife states the site is swollen.  Please advise

## 2015-10-14 DIAGNOSIS — N183 Chronic kidney disease, stage 3 (moderate): Secondary | ICD-10-CM | POA: Diagnosis not present

## 2015-10-14 DIAGNOSIS — I495 Sick sinus syndrome: Secondary | ICD-10-CM | POA: Diagnosis not present

## 2015-10-14 DIAGNOSIS — E1122 Type 2 diabetes mellitus with diabetic chronic kidney disease: Secondary | ICD-10-CM | POA: Diagnosis not present

## 2015-10-14 DIAGNOSIS — I10 Essential (primary) hypertension: Secondary | ICD-10-CM | POA: Diagnosis not present

## 2015-10-14 DIAGNOSIS — Z950 Presence of cardiac pacemaker: Secondary | ICD-10-CM | POA: Diagnosis not present

## 2015-10-14 DIAGNOSIS — M159 Polyosteoarthritis, unspecified: Secondary | ICD-10-CM | POA: Diagnosis not present

## 2015-10-23 ENCOUNTER — Ambulatory Visit (INDEPENDENT_AMBULATORY_CARE_PROVIDER_SITE_OTHER): Payer: Commercial Managed Care - HMO | Admitting: *Deleted

## 2015-10-23 ENCOUNTER — Encounter: Payer: Self-pay | Admitting: Internal Medicine

## 2015-10-23 DIAGNOSIS — Z950 Presence of cardiac pacemaker: Secondary | ICD-10-CM | POA: Diagnosis not present

## 2015-10-23 LAB — CUP PACEART INCLINIC DEVICE CHECK
Brady Statistic RA Percent Paced: 86 %
Brady Statistic RV Percent Paced: 96 %
Date Time Interrogation Session: 20170727131800
Implantable Lead Implant Date: 20170714
Implantable Lead Implant Date: 20170714
Implantable Lead Location: 753859
Implantable Lead Location: 753860
Implantable Lead Model: 377
Implantable Lead Model: 377
Implantable Lead Serial Number: 49489173
Implantable Lead Serial Number: 49549002
Lead Channel Impedance Value: 565 Ohm
Lead Channel Impedance Value: 682 Ohm
Lead Channel Pacing Threshold Amplitude: 0.5 V
Lead Channel Pacing Threshold Amplitude: 0.6 V
Lead Channel Pacing Threshold Amplitude: 0.6 V
Lead Channel Pacing Threshold Amplitude: 0.6 V
Lead Channel Pacing Threshold Amplitude: 0.6 V
Lead Channel Pacing Threshold Pulse Width: 0.4 ms
Lead Channel Pacing Threshold Pulse Width: 0.4 ms
Lead Channel Pacing Threshold Pulse Width: 0.4 ms
Lead Channel Pacing Threshold Pulse Width: 0.4 ms
Lead Channel Pacing Threshold Pulse Width: 0.4 ms
Lead Channel Sensing Intrinsic Amplitude: 14.5 mV
Lead Channel Sensing Intrinsic Amplitude: 5.9 mV
Lead Channel Sensing Intrinsic Amplitude: 6 mV
Lead Channel Setting Pacing Amplitude: 3 V
Lead Channel Setting Pacing Amplitude: 3 V
Lead Channel Setting Pacing Pulse Width: 0.4 ms
Pulse Gen Model: 394969
Pulse Gen Serial Number: 68764743

## 2015-10-23 NOTE — Progress Notes (Signed)
Wound check appointment. Steri-strips removed. Wound without redness or edema. Incision edges approximated, wound well healed. Normal device function. Thresholds, sensing, and impedances consistent with implant measurements. Device programmed at 3.5V extra safety margin until 3 month visit. Histogram distribution appropriate for patient and level of activity. No mode switches. 1 high ventricular rate noted- 15 beats NSVT. Patient educated about wound care, arm mobility, lifting restrictions and remote monitoring. ROV with GT 01/13/16.

## 2015-10-24 ENCOUNTER — Encounter: Payer: Self-pay | Admitting: Internal Medicine

## 2015-10-27 ENCOUNTER — Encounter: Payer: Self-pay | Admitting: Internal Medicine

## 2015-11-12 DIAGNOSIS — Z1389 Encounter for screening for other disorder: Secondary | ICD-10-CM | POA: Diagnosis not present

## 2015-11-12 DIAGNOSIS — M170 Bilateral primary osteoarthritis of knee: Secondary | ICD-10-CM | POA: Diagnosis not present

## 2015-11-12 DIAGNOSIS — Z0000 Encounter for general adult medical examination without abnormal findings: Secondary | ICD-10-CM | POA: Diagnosis not present

## 2015-11-12 DIAGNOSIS — Z23 Encounter for immunization: Secondary | ICD-10-CM | POA: Diagnosis not present

## 2016-01-13 ENCOUNTER — Encounter: Payer: Self-pay | Admitting: Internal Medicine

## 2016-01-13 ENCOUNTER — Ambulatory Visit (INDEPENDENT_AMBULATORY_CARE_PROVIDER_SITE_OTHER): Payer: Commercial Managed Care - HMO | Admitting: Internal Medicine

## 2016-01-13 VITALS — BP 126/64 | HR 68 | Ht 66.0 in | Wt 165.0 lb

## 2016-01-13 DIAGNOSIS — I442 Atrioventricular block, complete: Secondary | ICD-10-CM

## 2016-01-13 DIAGNOSIS — Z950 Presence of cardiac pacemaker: Secondary | ICD-10-CM | POA: Diagnosis not present

## 2016-01-13 LAB — CUP PACEART INCLINIC DEVICE CHECK
Brady Statistic RA Percent Paced: 91 %
Brady Statistic RV Percent Paced: 100 %
Date Time Interrogation Session: 20171017130300
Implantable Lead Implant Date: 20170714
Implantable Lead Implant Date: 20170714
Implantable Lead Location: 753859
Implantable Lead Location: 753860
Implantable Lead Model: 377
Implantable Lead Model: 377
Implantable Lead Serial Number: 49489173
Implantable Lead Serial Number: 49549002
Lead Channel Impedance Value: 546 Ohm
Lead Channel Impedance Value: 682 Ohm
Lead Channel Pacing Threshold Amplitude: 0.6 V
Lead Channel Pacing Threshold Amplitude: 0.6 V
Lead Channel Pacing Threshold Amplitude: 0.6 V
Lead Channel Pacing Threshold Amplitude: 0.8 V
Lead Channel Pacing Threshold Amplitude: 0.8 V
Lead Channel Pacing Threshold Pulse Width: 0.4 ms
Lead Channel Pacing Threshold Pulse Width: 0.4 ms
Lead Channel Pacing Threshold Pulse Width: 0.4 ms
Lead Channel Pacing Threshold Pulse Width: 0.4 ms
Lead Channel Pacing Threshold Pulse Width: 0.4 ms
Lead Channel Sensing Intrinsic Amplitude: 3.8 mV
Lead Channel Sensing Intrinsic Amplitude: 5.1 mV
Lead Channel Setting Pacing Amplitude: 2 V
Lead Channel Setting Pacing Amplitude: 2.4 V
Lead Channel Setting Pacing Pulse Width: 0.4 ms
Pulse Gen Model: 394969
Pulse Gen Serial Number: 68764743

## 2016-01-13 NOTE — Progress Notes (Signed)
HPI Mr. Noah Adkins returns today for followup. He is a pleasant 82 yo man with CHB who underwent PPM insertion in July 2017. In the interim, he has done well. He denies chest pain or sob. He has had minimal peripheral edema and no passing out or dizzy spells.  Allergies  Allergen Reactions  . Atenolol Other (See Comments)    Dizzy, nausea, headache  . Dilacor Xr [Diltiazem Hcl] Other (See Comments)    NOT EFFECTIVE  . Isoptin Sr [Verapamil Hcl Er]     Erectile dysfunction  . Lopressor [Metoprolol Tartrate] Other (See Comments)    Nausea, dizzy, headache  . Norvasc [Amlodipine Besylate] Other (See Comments)    Headache and sexual dysfunction     Current Outpatient Prescriptions  Medication Sig Dispense Refill  . Acetaminophen-Caffeine (EXCEDRIN TENSION HEADACHE) 500-65 MG TABS Take 2 tablets by mouth daily as needed (headache).    Marland Kitchen. allopurinol (ZYLOPRIM) 300 MG tablet Take 300 mg by mouth daily.     Marland Kitchen. aspirin EC 81 MG tablet Take 81 mg by mouth daily.    . felodipine (PLENDIL) 5 MG 24 hr tablet Take 5 mg by mouth daily.     . fluticasone (FLONASE) 50 MCG/ACT nasal spray Place 1 spray into both nostrils daily.     Marland Kitchen. glipiZIDE (GLUCOTROL) 5 MG tablet Take 2.5 mg by mouth daily.    Marland Kitchen. lisinopril-hydrochlorothiazide (PRINZIDE,ZESTORETIC) 20-25 MG per tablet Take 1 tablet by mouth daily.     Marland Kitchen. oxyCODONE-acetaminophen (PERCOCET/ROXICET) 5-325 MG tablet Take 1 tablet by mouth every 6 (six) hours as needed (pain).     Marland Kitchen. solifenacin (VESICARE) 10 MG tablet Take 10 mg by mouth at bedtime.    . traMADol (ULTRAM) 50 MG tablet Take 50 mg by mouth 2 (two) times daily as needed (pain).    . traZODone (DESYREL) 100 MG tablet Take 100 mg by mouth at bedtime.     No current facility-administered medications for this visit.      Past Medical History:  Diagnosis Date  . Arthritis    "knees, hands" (10/10/2015)  . History of blood transfusion 1970s   "related to work related injury; broke rib;  lost blood"  . History of gout   . Hypertension   . Kidney stones   . Presence of permanent cardiac pacemaker   . Type II diabetes mellitus (HCC)     ROS:   All systems reviewed and negative except as noted in the HPI.   Past Surgical History:  Procedure Laterality Date  . CATARACT EXTRACTION Right 2015  . CYSTOSCOPY W/ STONE MANIPULATION  1970s  . EP IMPLANTABLE DEVICE N/A 10/10/2015   Procedure: Pacemaker Implant;  Surgeon: Marinus MawGregg W Myrel Rappleye, MD;  [facility];  Service: Cardiovascular;  Laterality: N/A;  . INSERT / REPLACE / REMOVE PACEMAKER  10/10/2015     Family History  Problem Relation Age of Onset  . Hypertension Father   . Diabetes Mother      Social History   Social History  . Marital status: Married    Spouse name: N/A  . Number of children: N/A  . Years of education: N/A   Occupational History  . Not on file.   Social History Main Topics  . Smoking status: Former Smoker    Years: 30.00    Types: Pipe, Cigars  . Smokeless tobacco: Former NeurosurgeonUser    Types: Chew     Comment: 10/10/2015 "quit smoking cigarettes & chewing  tobacco in the 1980s"  . Alcohol use 0.0 oz/week     Comment: 10/10/2015 "last year I had 2 beers"  . Drug use: No  . Sexual activity: Not Currently   Other Topics Concern  . Not on file   Social History Narrative  . No narrative on file     BP 126/64   Pulse 68   Ht 5\' 6"  (1.676 m)   Wt 165 lb (74.8 kg)   SpO2 98%   BMI 26.63 kg/m   Physical Exam:  Well appearing elderly man, NAD HEENT: Unremarkable Neck:  No JVD, no thyromegally Lymphatics:  No adenopathy Back:  No CVA tenderness Lungs:  Clear with no wheezes. PPM is well healed. HEART:  Regular rate rhythm, no murmurs, no rubs, no clicks Abd:  soft, positive bowel sounds, no organomegally, no rebound, no guarding Ext:  2 plus pulses, no edema, no cyanosis, no clubbing Skin:  No rashes no nodules Neuro:  CN II through XII intact, motor grossly  intact  DEVICE  Normal device function.  See PaceArt for details.   Assess/Plan: 1. CHB - he is asymptomatic, s/p PPM. No conduction today 2. PPM - his Biotronik DDD PM is working normally. Will follow. 3. CAD - he denies anginal symptoms. Will follow.  Leonia Reeves.D.

## 2016-01-13 NOTE — Patient Instructions (Addendum)
Medication Instructions:  Your physician recommends that you continue on your current medications as directed. Please refer to the Current Medication list given to you today.   Labwork: None Ordered   Testing/Procedures: None Ordered   Follow-Up: Your physician wants you to follow-up in: 9 months with Dr. Taylor.  You will receive a reminder letter in the mail two months in advance. If you don't receive a letter, please call our office to schedule the follow-up appointment.  Remote monitoring is used to monitor your Pacemaker  from home. This monitoring reduces the number of office visits required to check your device to one time per year. It allows us to keep an eye on the functioning of your device to ensure it is working properly. You are scheduled for a device check from home on 04/13/16. You may send your transmission at any time that day. If you have a wireless device, the transmission will be sent automatically. After your physician reviews your transmission, you will receive a postcard with your next transmission date.     Any Other Special Instructions Will Be Listed Below (If Applicable).     If you need a refill on your cardiac medications before your next appointment, please call your pharmacy.   

## 2016-03-12 DIAGNOSIS — E113299 Type 2 diabetes mellitus with mild nonproliferative diabetic retinopathy without macular edema, unspecified eye: Secondary | ICD-10-CM | POA: Diagnosis not present

## 2016-03-12 DIAGNOSIS — N183 Chronic kidney disease, stage 3 (moderate): Secondary | ICD-10-CM | POA: Diagnosis not present

## 2016-03-12 DIAGNOSIS — E7800 Pure hypercholesterolemia, unspecified: Secondary | ICD-10-CM | POA: Diagnosis not present

## 2016-03-12 DIAGNOSIS — E1122 Type 2 diabetes mellitus with diabetic chronic kidney disease: Secondary | ICD-10-CM | POA: Diagnosis not present

## 2016-03-12 DIAGNOSIS — I10 Essential (primary) hypertension: Secondary | ICD-10-CM | POA: Diagnosis not present

## 2016-03-12 DIAGNOSIS — M170 Bilateral primary osteoarthritis of knee: Secondary | ICD-10-CM | POA: Diagnosis not present

## 2016-03-12 DIAGNOSIS — G4700 Insomnia, unspecified: Secondary | ICD-10-CM | POA: Diagnosis not present

## 2016-04-13 ENCOUNTER — Ambulatory Visit (INDEPENDENT_AMBULATORY_CARE_PROVIDER_SITE_OTHER): Payer: Medicare HMO | Admitting: *Deleted

## 2016-04-13 DIAGNOSIS — Z950 Presence of cardiac pacemaker: Secondary | ICD-10-CM

## 2016-04-13 NOTE — Progress Notes (Signed)
Remote pacemaker transmission.   

## 2016-04-20 LAB — CUP PACEART INCLINIC DEVICE CHECK
Brady Statistic RA Percent Paced: 91 %
Brady Statistic RV Percent Paced: 100 %
Date Time Interrogation Session: 20180123152620
Implantable Lead Implant Date: 20170714
Implantable Lead Implant Date: 20170714
Implantable Lead Location: 753859
Implantable Lead Location: 753860
Implantable Lead Model: 377
Implantable Lead Model: 377
Implantable Lead Serial Number: 49489173
Implantable Lead Serial Number: 49549002
Implantable Pulse Generator Implant Date: 20170714
Lead Channel Impedance Value: 546 Ohm
Lead Channel Impedance Value: 663 Ohm
Lead Channel Pacing Threshold Amplitude: 0.6 V
Lead Channel Pacing Threshold Amplitude: 0.7 V
Lead Channel Pacing Threshold Pulse Width: 0.4 ms
Lead Channel Pacing Threshold Pulse Width: 0.4 ms
Lead Channel Sensing Intrinsic Amplitude: 11.6 mV
Lead Channel Sensing Intrinsic Amplitude: 2.9 mV
Lead Channel Setting Pacing Amplitude: 2 V
Lead Channel Setting Pacing Amplitude: 2.4 V
Lead Channel Setting Pacing Pulse Width: 0.4 ms
Pulse Gen Model: 394969
Pulse Gen Serial Number: 68764743

## 2016-04-21 ENCOUNTER — Encounter: Payer: Self-pay | Admitting: Cardiology

## 2016-05-05 ENCOUNTER — Encounter: Payer: Self-pay | Admitting: Cardiology

## 2016-07-13 ENCOUNTER — Ambulatory Visit (INDEPENDENT_AMBULATORY_CARE_PROVIDER_SITE_OTHER): Payer: Medicare HMO | Admitting: *Deleted

## 2016-07-13 DIAGNOSIS — I495 Sick sinus syndrome: Secondary | ICD-10-CM

## 2016-07-13 NOTE — Progress Notes (Signed)
Remote pacemaker transmission.   

## 2016-07-15 LAB — CUP PACEART REMOTE DEVICE CHECK
Brady Statistic RA Percent Paced: 90 %
Brady Statistic RV Percent Paced: 100 %
Date Time Interrogation Session: 20180419144711
Implantable Lead Implant Date: 20170714
Implantable Lead Implant Date: 20170714
Implantable Lead Location: 753859
Implantable Lead Location: 753860
Implantable Lead Model: 377
Implantable Lead Model: 377
Implantable Lead Serial Number: 49489173
Implantable Lead Serial Number: 49549002
Implantable Pulse Generator Implant Date: 20170714
Lead Channel Impedance Value: 527 Ohm
Lead Channel Impedance Value: 624 Ohm
Lead Channel Pacing Threshold Amplitude: 0.6 V
Lead Channel Pacing Threshold Amplitude: 0.8 V
Lead Channel Pacing Threshold Pulse Width: 0.4 ms
Lead Channel Pacing Threshold Pulse Width: 0.4 ms
Lead Channel Sensing Intrinsic Amplitude: 13.8 mV
Lead Channel Sensing Intrinsic Amplitude: 4.1 mV
Pulse Gen Model: 394969
Pulse Gen Serial Number: 68764743

## 2016-07-16 ENCOUNTER — Encounter: Payer: Self-pay | Admitting: Cardiology

## 2016-07-16 DIAGNOSIS — I10 Essential (primary) hypertension: Secondary | ICD-10-CM | POA: Diagnosis not present

## 2016-07-16 DIAGNOSIS — M170 Bilateral primary osteoarthritis of knee: Secondary | ICD-10-CM | POA: Diagnosis not present

## 2016-07-16 DIAGNOSIS — G894 Chronic pain syndrome: Secondary | ICD-10-CM | POA: Diagnosis not present

## 2016-07-30 DIAGNOSIS — I129 Hypertensive chronic kidney disease with stage 1 through stage 4 chronic kidney disease, or unspecified chronic kidney disease: Secondary | ICD-10-CM | POA: Diagnosis not present

## 2016-07-30 DIAGNOSIS — M170 Bilateral primary osteoarthritis of knee: Secondary | ICD-10-CM | POA: Diagnosis not present

## 2016-07-30 DIAGNOSIS — G894 Chronic pain syndrome: Secondary | ICD-10-CM | POA: Diagnosis not present

## 2016-07-30 DIAGNOSIS — M19041 Primary osteoarthritis, right hand: Secondary | ICD-10-CM | POA: Diagnosis not present

## 2016-08-03 ENCOUNTER — Encounter: Payer: Self-pay | Admitting: Cardiology

## 2016-09-14 DIAGNOSIS — E1122 Type 2 diabetes mellitus with diabetic chronic kidney disease: Secondary | ICD-10-CM | POA: Diagnosis not present

## 2016-09-14 DIAGNOSIS — I10 Essential (primary) hypertension: Secondary | ICD-10-CM | POA: Diagnosis not present

## 2016-09-14 DIAGNOSIS — M170 Bilateral primary osteoarthritis of knee: Secondary | ICD-10-CM | POA: Diagnosis not present

## 2016-09-14 DIAGNOSIS — Z0000 Encounter for general adult medical examination without abnormal findings: Secondary | ICD-10-CM | POA: Diagnosis not present

## 2016-09-14 DIAGNOSIS — G4700 Insomnia, unspecified: Secondary | ICD-10-CM | POA: Diagnosis not present

## 2016-09-14 DIAGNOSIS — N183 Chronic kidney disease, stage 3 (moderate): Secondary | ICD-10-CM | POA: Diagnosis not present

## 2016-09-14 DIAGNOSIS — Z1389 Encounter for screening for other disorder: Secondary | ICD-10-CM | POA: Diagnosis not present

## 2016-09-14 DIAGNOSIS — E113299 Type 2 diabetes mellitus with mild nonproliferative diabetic retinopathy without macular edema, unspecified eye: Secondary | ICD-10-CM | POA: Diagnosis not present

## 2016-09-14 DIAGNOSIS — E7800 Pure hypercholesterolemia, unspecified: Secondary | ICD-10-CM | POA: Diagnosis not present

## 2016-10-12 ENCOUNTER — Ambulatory Visit (INDEPENDENT_AMBULATORY_CARE_PROVIDER_SITE_OTHER): Payer: Medicare HMO | Admitting: *Deleted

## 2016-10-12 DIAGNOSIS — I495 Sick sinus syndrome: Secondary | ICD-10-CM

## 2016-10-12 LAB — CUP PACEART REMOTE DEVICE CHECK
Brady Statistic AP VP Percent: 94 %
Brady Statistic AP VS Percent: 0 %
Brady Statistic AS VP Percent: 5 %
Brady Statistic AS VS Percent: 0 %
Brady Statistic RA Percent Paced: 95 %
Brady Statistic RV Percent Paced: 99 %
Date Time Interrogation Session: 20180717042757
Implantable Lead Implant Date: 20170714
Implantable Lead Implant Date: 20170714
Implantable Lead Location: 753859
Implantable Lead Location: 753860
Implantable Lead Model: 377
Implantable Lead Model: 377
Implantable Lead Serial Number: 49489173
Implantable Lead Serial Number: 49549002
Implantable Pulse Generator Implant Date: 20170714
Lead Channel Impedance Value: 533 Ohm
Lead Channel Impedance Value: 660 Ohm
Lead Channel Pacing Threshold Amplitude: 0.6 V
Lead Channel Pacing Threshold Amplitude: 0.7 V
Lead Channel Pacing Threshold Pulse Width: 0.4 ms
Lead Channel Pacing Threshold Pulse Width: 0.4 ms
Lead Channel Setting Pacing Amplitude: 2 V
Lead Channel Setting Pacing Amplitude: 2.4 V
Lead Channel Setting Pacing Pulse Width: 0.4 ms
Pulse Gen Model: 394969
Pulse Gen Serial Number: 68764743

## 2016-10-12 NOTE — Progress Notes (Signed)
Remote pacemaker transmission.   

## 2016-10-13 ENCOUNTER — Encounter: Payer: Self-pay | Admitting: Cardiology

## 2016-10-27 ENCOUNTER — Encounter: Payer: Self-pay | Admitting: Cardiology

## 2016-12-30 DIAGNOSIS — Z23 Encounter for immunization: Secondary | ICD-10-CM | POA: Diagnosis not present

## 2017-01-06 ENCOUNTER — Other Ambulatory Visit: Payer: Self-pay | Admitting: Family Medicine

## 2017-01-06 ENCOUNTER — Ambulatory Visit
Admission: RE | Admit: 2017-01-06 | Discharge: 2017-01-06 | Disposition: A | Discharge status: Home / Self Care | Payer: Medicare HMO | Source: Ambulatory Visit | Attending: Family Medicine | Admitting: Family Medicine

## 2017-01-06 DIAGNOSIS — R05 Cough: Secondary | ICD-10-CM | POA: Diagnosis not present

## 2017-01-06 DIAGNOSIS — J069 Acute upper respiratory infection, unspecified: Secondary | ICD-10-CM

## 2017-01-11 ENCOUNTER — Ambulatory Visit (INDEPENDENT_AMBULATORY_CARE_PROVIDER_SITE_OTHER): Payer: Medicare HMO | Admitting: *Deleted

## 2017-01-11 DIAGNOSIS — I495 Sick sinus syndrome: Secondary | ICD-10-CM | POA: Diagnosis not present

## 2017-01-11 NOTE — Progress Notes (Signed)
Remote pacemaker transmission.   

## 2017-01-14 ENCOUNTER — Encounter: Payer: Self-pay | Admitting: Cardiology

## 2017-02-03 LAB — CUP PACEART REMOTE DEVICE CHECK
Date Time Interrogation Session: 20181108143428
Implantable Lead Implant Date: 20170714
Implantable Lead Implant Date: 20170714
Implantable Lead Location: 753859
Implantable Lead Location: 753860
Implantable Lead Model: 377
Implantable Lead Model: 377
Implantable Lead Serial Number: 49489173
Implantable Lead Serial Number: 49549002
Implantable Pulse Generator Implant Date: 20170714
Lead Channel Setting Pacing Amplitude: 2 V
Lead Channel Setting Pacing Amplitude: 2.4 V
Lead Channel Setting Pacing Pulse Width: 0.4 ms
Pulse Gen Model: 394969
Pulse Gen Serial Number: 68764743

## 2017-03-15 DIAGNOSIS — G4700 Insomnia, unspecified: Secondary | ICD-10-CM | POA: Diagnosis not present

## 2017-03-15 DIAGNOSIS — R112 Nausea with vomiting, unspecified: Secondary | ICD-10-CM | POA: Diagnosis not present

## 2017-03-15 DIAGNOSIS — E113299 Type 2 diabetes mellitus with mild nonproliferative diabetic retinopathy without macular edema, unspecified eye: Secondary | ICD-10-CM | POA: Diagnosis not present

## 2017-03-15 DIAGNOSIS — M109 Gout, unspecified: Secondary | ICD-10-CM | POA: Diagnosis not present

## 2017-03-15 DIAGNOSIS — M170 Bilateral primary osteoarthritis of knee: Secondary | ICD-10-CM | POA: Diagnosis not present

## 2017-03-15 DIAGNOSIS — E1122 Type 2 diabetes mellitus with diabetic chronic kidney disease: Secondary | ICD-10-CM | POA: Diagnosis not present

## 2017-03-15 DIAGNOSIS — I10 Essential (primary) hypertension: Secondary | ICD-10-CM | POA: Diagnosis not present

## 2017-03-15 DIAGNOSIS — E7800 Pure hypercholesterolemia, unspecified: Secondary | ICD-10-CM | POA: Diagnosis not present

## 2017-03-15 DIAGNOSIS — N183 Chronic kidney disease, stage 3 (moderate): Secondary | ICD-10-CM | POA: Diagnosis not present

## 2017-04-10 ENCOUNTER — Other Ambulatory Visit: Payer: Self-pay

## 2017-04-10 ENCOUNTER — Encounter (HOSPITAL_COMMUNITY): Payer: Self-pay | Admitting: Emergency Medicine

## 2017-04-10 ENCOUNTER — Emergency Department (HOSPITAL_COMMUNITY): Payer: Medicare HMO

## 2017-04-10 ENCOUNTER — Observation Stay (HOSPITAL_COMMUNITY)
Admission: EM | Admit: 2017-04-10 | Discharge: 2017-04-11 | Disposition: A | Discharge status: Home / Self Care | Payer: Medicare HMO | Attending: Family Medicine | Admitting: Family Medicine

## 2017-04-10 DIAGNOSIS — N183 Chronic kidney disease, stage 3 (moderate): Secondary | ICD-10-CM | POA: Diagnosis not present

## 2017-04-10 DIAGNOSIS — R262 Difficulty in walking, not elsewhere classified: Secondary | ICD-10-CM | POA: Diagnosis not present

## 2017-04-10 DIAGNOSIS — Z7984 Long term (current) use of oral hypoglycemic drugs: Secondary | ICD-10-CM | POA: Insufficient documentation

## 2017-04-10 DIAGNOSIS — G459 Transient cerebral ischemic attack, unspecified: Secondary | ICD-10-CM | POA: Diagnosis not present

## 2017-04-10 DIAGNOSIS — R531 Weakness: Secondary | ICD-10-CM | POA: Diagnosis not present

## 2017-04-10 DIAGNOSIS — Z79899 Other long term (current) drug therapy: Secondary | ICD-10-CM | POA: Insufficient documentation

## 2017-04-10 DIAGNOSIS — Z87891 Personal history of nicotine dependence: Secondary | ICD-10-CM | POA: Diagnosis not present

## 2017-04-10 DIAGNOSIS — E119 Type 2 diabetes mellitus without complications: Secondary | ICD-10-CM | POA: Diagnosis not present

## 2017-04-10 DIAGNOSIS — R410 Disorientation, unspecified: Secondary | ICD-10-CM | POA: Diagnosis not present

## 2017-04-10 DIAGNOSIS — Z7982 Long term (current) use of aspirin: Secondary | ICD-10-CM | POA: Diagnosis not present

## 2017-04-10 DIAGNOSIS — I129 Hypertensive chronic kidney disease with stage 1 through stage 4 chronic kidney disease, or unspecified chronic kidney disease: Secondary | ICD-10-CM | POA: Insufficient documentation

## 2017-04-10 DIAGNOSIS — R479 Unspecified speech disturbances: Secondary | ICD-10-CM | POA: Diagnosis not present

## 2017-04-10 DIAGNOSIS — Z950 Presence of cardiac pacemaker: Secondary | ICD-10-CM | POA: Diagnosis not present

## 2017-04-10 DIAGNOSIS — I10 Essential (primary) hypertension: Secondary | ICD-10-CM | POA: Diagnosis not present

## 2017-04-10 DIAGNOSIS — R4182 Altered mental status, unspecified: Secondary | ICD-10-CM | POA: Diagnosis not present

## 2017-04-10 LAB — RAPID URINE DRUG SCREEN, HOSP PERFORMED
Amphetamines: NOT DETECTED
Barbiturates: NOT DETECTED
Benzodiazepines: NOT DETECTED
Cocaine: NOT DETECTED
Opiates: NOT DETECTED
Tetrahydrocannabinol: NOT DETECTED

## 2017-04-10 LAB — COMPREHENSIVE METABOLIC PANEL
ALT: 30 U/L (ref 17–63)
AST: 26 U/L (ref 15–41)
Albumin: 3.7 g/dL (ref 3.5–5.0)
Alkaline Phosphatase: 71 U/L (ref 38–126)
Anion gap: 10 (ref 5–15)
BUN: 21 mg/dL — ABNORMAL HIGH (ref 6–20)
CO2: 27 mmol/L (ref 22–32)
Calcium: 9.2 mg/dL (ref 8.9–10.3)
Chloride: 100 mmol/L — ABNORMAL LOW (ref 101–111)
Creatinine, Ser: 1.39 mg/dL — ABNORMAL HIGH (ref 0.61–1.24)
GFR calc Af Amer: 51 mL/min — ABNORMAL LOW (ref >60)
GFR calc non Af Amer: 44 mL/min — ABNORMAL LOW (ref >60)
Glucose, Bld: 217 mg/dL — ABNORMAL HIGH (ref 65–99)
Potassium: 4 mmol/L (ref 3.5–5.1)
Sodium: 137 mmol/L (ref 135–145)
Total Bilirubin: 0.8 mg/dL (ref 0.3–1.2)
Total Protein: 6.2 g/dL — ABNORMAL LOW (ref 6.5–8.1)

## 2017-04-10 LAB — URINALYSIS, ROUTINE W REFLEX MICROSCOPIC
Bacteria, UA: NONE SEEN
Bilirubin Urine: NEGATIVE
Glucose, UA: NEGATIVE mg/dL
Hgb urine dipstick: NEGATIVE
Ketones, ur: NEGATIVE mg/dL
Leukocytes, UA: NEGATIVE
Nitrite: NEGATIVE
Protein, ur: 30 mg/dL — AB
Specific Gravity, Urine: 1.013 (ref 1.005–1.030)
Squamous Epithelial / LPF: NONE SEEN
pH: 6 (ref 5.0–8.0)

## 2017-04-10 LAB — I-STAT TROPONIN, ED: Troponin i, poc: 0.02 ng/mL (ref 0.00–0.08)

## 2017-04-10 LAB — PROTIME-INR
INR: 0.98
Prothrombin Time: 12.9 seconds (ref 11.4–15.2)

## 2017-04-10 LAB — CBC
HCT: 45.9 % (ref 39.0–52.0)
Hemoglobin: 15.8 g/dL (ref 13.0–17.0)
MCH: 32.4 pg (ref 26.0–34.0)
MCHC: 34.4 g/dL (ref 30.0–36.0)
MCV: 94.3 fL (ref 78.0–100.0)
Platelets: 227 10*3/uL (ref 150–400)
RBC: 4.87 MIL/uL (ref 4.22–5.81)
RDW: 13.5 % (ref 11.5–15.5)
WBC: 10.7 10*3/uL — ABNORMAL HIGH (ref 4.0–10.5)

## 2017-04-10 LAB — ETHANOL: Alcohol, Ethyl (B): <10 mg/dL (ref <10)

## 2017-04-10 LAB — CBG MONITORING, ED: Glucose-Capillary: 201 mg/dL — ABNORMAL HIGH (ref 65–99)

## 2017-04-10 LAB — APTT: aPTT: 38 seconds — ABNORMAL HIGH (ref 24–36)

## 2017-04-10 MED ORDER — OXYBUTYNIN CHLORIDE 5 MG PO TABS
5.0000 mg | ORAL_TABLET | Freq: Two times a day (BID) | ORAL | Status: DC
  Administered 2017-04-10 – 2017-04-11 (×2): 5 mg via ORAL
  Filled 2017-04-10 (×2): qty 1

## 2017-04-10 MED ORDER — ACETAMINOPHEN 325 MG PO TABS: 650.0000 mg | ORAL_TABLET | ORAL | Status: DC | PRN

## 2017-04-10 MED ORDER — SIMVASTATIN 10 MG PO TABS: 10.0000 mg | ORAL_TABLET | Freq: Every day | ORAL | Status: DC

## 2017-04-10 MED ORDER — SIMVASTATIN 20 MG PO TABS
20.0000 mg | ORAL_TABLET | Freq: Every day | ORAL | Status: DC
  Administered 2017-04-11: 20 mg via ORAL
  Filled 2017-04-10: qty 1

## 2017-04-10 MED ORDER — HEPARIN SODIUM (PORCINE) 5000 UNIT/ML IJ SOLN
5000.0000 [IU] | Freq: Three times a day (TID) | INTRAMUSCULAR | Status: DC
  Administered 2017-04-10 – 2017-04-11 (×3): 5000 [IU] via SUBCUTANEOUS
  Filled 2017-04-10 (×3): qty 1

## 2017-04-10 MED ORDER — TRAZODONE HCL 50 MG PO TABS
100.0000 mg | ORAL_TABLET | Freq: Every day | ORAL | Status: DC
  Administered 2017-04-10: 100 mg via ORAL
  Filled 2017-04-10: qty 2

## 2017-04-10 MED ORDER — ACETAMINOPHEN 650 MG RE SUPP: 650.0000 mg | RECTAL | Status: DC | PRN

## 2017-04-10 MED ORDER — GLIPIZIDE 2.5 MG HALF TABLET
2.5000 mg | ORAL_TABLET | Freq: Every day | ORAL | Status: DC
  Administered 2017-04-11: 2.5 mg via ORAL
  Filled 2017-04-10: qty 1

## 2017-04-10 MED ORDER — ACETAMINOPHEN 160 MG/5ML PO SOLN: 650.0000 mg | ORAL | Status: DC | PRN

## 2017-04-10 MED ORDER — ALLOPURINOL 300 MG PO TABS
300.0000 mg | ORAL_TABLET | Freq: Every day | ORAL | Status: DC
  Administered 2017-04-11: 300 mg via ORAL
  Filled 2017-04-10: qty 1
  Filled 2017-04-10: qty 3

## 2017-04-10 MED ORDER — ONDANSETRON HCL 4 MG/2ML IJ SOLN
4.0000 mg | Freq: Four times a day (QID) | INTRAMUSCULAR | Status: DC | PRN
  Administered 2017-04-10: 4 mg via INTRAVENOUS
  Filled 2017-04-10: qty 2

## 2017-04-10 MED ORDER — ASPIRIN EC 325 MG PO TBEC
325.0000 mg | DELAYED_RELEASE_TABLET | Freq: Every day | ORAL | Status: DC
  Administered 2017-04-11: 325 mg via ORAL
  Filled 2017-04-10: qty 1

## 2017-04-10 MED ORDER — FLUTICASONE PROPIONATE 50 MCG/ACT NA SUSP
1.0000 | Freq: Every day | NASAL | Status: DC
  Administered 2017-04-11: 1 via NASAL
  Filled 2017-04-10: qty 16

## 2017-04-10 MED ORDER — SENNOSIDES-DOCUSATE SODIUM 8.6-50 MG PO TABS: 1.0000 | ORAL_TABLET | Freq: Every evening | ORAL | Status: DC | PRN

## 2017-04-10 MED ORDER — DARIFENACIN HYDROBROMIDE ER 15 MG PO TB24
15.0000 mg | ORAL_TABLET | Freq: Every day | ORAL | Status: DC
  Administered 2017-04-11: 15 mg via ORAL
  Filled 2017-04-10 (×2): qty 1

## 2017-04-10 MED ORDER — SODIUM CHLORIDE 0.9 % IV SOLN
INTRAVENOUS | Status: DC
  Administered 2017-04-10: 20:00:00 via INTRAVENOUS

## 2017-04-10 MED ORDER — OXYCODONE-ACETAMINOPHEN 5-325 MG PO TABS: 1.0000 | ORAL_TABLET | Freq: Four times a day (QID) | ORAL | Status: DC | PRN

## 2017-04-10 MED ORDER — STROKE: EARLY STAGES OF RECOVERY BOOK
Freq: Once | Status: AC
  Administered 2017-04-11: 06:00:00
  Filled 2017-04-10: qty 1

## 2017-04-10 NOTE — ED Notes (Signed)
Attempted report x1. 

## 2017-04-10 NOTE — ED Triage Notes (Addendum)
Pt arrives from PCP with spouse, reports LSN 1000, reports pt had difficulty with ADL's seemed confused. No focal deficits noted in triage, pt AOx4.  Pt reports diarrhea ongoing x 1 week, some n/v, denies n/v/abd pain at this time.   Dr. Effie ShyWentz evaluated pt for VAN at triage, VAN negative.

## 2017-04-10 NOTE — ED Provider Notes (Signed)
MSE was initiated and I personally evaluated the patient and placed orders (if any) at  1:56 PM on April 10, 2017.  Screening for possibility of CVA.  Wife is concerned that the patient is confused, and having trouble talking this morning.  Patient is alert, interactive, and cooperative.  No dysarthria or aphasia.  Normal grip strength arms bilaterally.  No facial asymmetry.  We will continue with triage process and placement to an examination room.  The patient appears stable so that the remainder of the MSE may be completed by another provider.   Mancel BaleWentz, Tahjay Binion, MD 04/10/17 1357

## 2017-04-10 NOTE — ED Notes (Signed)
EDP at bedside  

## 2017-04-10 NOTE — Progress Notes (Signed)
Patient arrived to the unit via ED stretcher at 1845. Pleasant, alert and oriented X 4.  Oriented to unit, policies, and safety of bed alarm. Education provided on common stroke care course for expectations.  Patient verbalizes understanding and family invited to join patient at bedside. Noah RadarHeather M Kimothy Kishimoto

## 2017-04-10 NOTE — ED Provider Notes (Signed)
[facility] Provider Note   CSN: [id number] Arrival date & time: 04/10/17  1347     History   Chief Complaint Chief Complaint  Patient presents with  . Altered Mental Status    HPI Noah Adkins is a 84 y.o. male.  HPI Patient presents with difficulty speaking difficulty using her hands.  Began earlier today while he was trying to eat.  During the episode is also somewhat confused and had difficulty speaking.  His wife had to cut his meat form.  Patient does not really remember the event.  No headache.  Has had some diarrhea over the last few days but states no diarrhea today.  Now back at his baseline.  The episode lasted around 20 minutes.  No chest pain or trouble breathing.  Reportedly had where shaking on his left side difficulty cutting the food and then getting the food into his mouth.  However he is right-handed but was reportedly using the left side. Past Medical History:  Diagnosis Date  . Arthritis    "knees, hands" (10/10/2015)  . History of blood transfusion 1970s   "related to work related injury; broke rib; lost blood"  . History of gout   . Hypertension   . Kidney stones   . Presence of permanent cardiac pacemaker   . Type II diabetes mellitus [facility](HCC)     Patient Active Problem List   Diagnosis Date Noted  . Syncopal seizure (HCC)   . Syncope 10/10/2015  . Symptomatic bradycardia 10/10/2015  . Postural hypotension 10/30/2013  . Preop cardiovascular exam 10/30/2013  . RBBB 10/30/2013  . DM (diabetes mellitus) type I controlled with renal manifestation (HCC) 10/30/2013    Past Surgical History:  Procedure Laterality Date  . CATARACT EXTRACTION Right 2015  . CYSTOSCOPY W/ STONE MANIPULATION  1970s  . EP IMPLANTABLE DEVICE N/A 10/10/2015   Procedure: Pacemaker Implant;  Surgeon: Marinus MawGregg W Taylor, MD;  [facility];  Service: Cardiovascular;  Laterality: N/A;  . INSERT / REPLACE / REMOVE PACEMAKER   10/10/2015       Home Medications    Prior to Admission medications   Medication Sig Start Date End Date Taking? Authorizing Provider  Acetaminophen-Caffeine (EXCEDRIN TENSION HEADACHE) 500-65 MG TABS Take 2 tablets by mouth daily as needed (headache).   Yes [provider]  allopurinol (ZYLOPRIM) 300 MG tablet Take 300 mg by mouth daily.  09/30/13  Yes [provider]  aspirin EC 81 MG tablet Take 81 mg by mouth daily.   Yes [provider]  felodipine (PLENDIL) 5 MG 24 hr tablet Take 5 mg by mouth daily.  09/17/13  Yes [provider]  fluticasone (FLONASE) 50 MCG/ACT nasal spray Place 1 spray into both nostrils daily.  09/17/13  Yes [provider]  glipiZIDE (GLUCOTROL) 5 MG tablet Take 2.5 mg by mouth daily.   Yes [provider]  lisinopril-hydrochlorothiazide (PRINZIDE,ZESTORETIC) 20-25 MG per tablet Take 1 tablet by mouth daily.  09/17/13  Yes [provider]  oxybutynin (DITROPAN) 5 MG tablet Take 5 mg by mouth 2 (two) times daily. 01/26/17  Yes [provider]  oxyCODONE-acetaminophen (PERCOCET/ROXICET) 5-325 MG tablet Take 1 tablet by mouth every 6 (six) hours as needed (pain).  09/13/15  Yes [provider]  simvastatin (ZOCOR) 10 MG tablet Take 10 mg by mouth daily. 02/25/17  Yes [provider]  solifenacin (VESICARE) 10 MG tablet Take 10 mg by mouth at bedtime.  Yes [provider]  traMADol (ULTRAM) 50 MG tablet Take 50 mg by mouth 2 (two) times daily as needed (pain).   Yes [provider]  traZODone (DESYREL) 100 MG tablet Take 100 mg by mouth at bedtime. 09/12/15  Yes [provider]    Family History Family History  Problem Relation Age of Onset  . Hypertension Father   . Diabetes Mother     Social History Social History   Tobacco Use  . Smoking status: Former Smoker    Years: 30.00    Types: Pipe, Cigars  . Smokeless tobacco: Former Neurosurgeon    Types:  Chew  . Tobacco comment: 10/10/2015 "quit smoking cigarettes & chewing tobacco in the 1980s"  Substance Use Topics  . Alcohol use: No    Alcohol/week: 0.0 oz    Frequency: Never    Comment: 10/10/2015 "last year I had 2 beers"  . Drug use: No     Allergies   Atenolol; Dilacor xr [diltiazem hcl]; Isoptin sr [verapamil hcl er]; Lopressor [metoprolol tartrate]; and Norvasc [amlodipine besylate]   Review of Systems Review of Systems  Constitutional: Negative for appetite change and fatigue.  HENT: Negative for congestion.   Respiratory: Negative for shortness of breath.   Cardiovascular: Negative for leg swelling.  Gastrointestinal: Negative for abdominal distention.  Genitourinary: Negative for flank pain.  Musculoskeletal: Negative for back pain.  Neurological: Positive for speech difficulty and weakness. Negative for light-headedness.  Psychiatric/Behavioral: Positive for confusion.     Physical Exam Updated Vital Signs BP (!) 145/74   Pulse 75   Temp 98.3 F (36.8 C) (Oral)   Resp 16   Ht 5\' 6"  (1.676 m)   Wt 73.5 kg (162 lb)   SpO2 95%   BMI 26.15 kg/m   Physical Exam  Constitutional: He is oriented to person, place, and time. He appears well-developed.  HENT:  Head: Atraumatic.  Eyes: EOM are normal.  Neck: Neck supple.  Cardiovascular: Normal rate.  Pulmonary/Chest: Effort normal.  Abdominal: There is no tenderness.  Musculoskeletal: He exhibits no edema or tenderness.  Neurological: He is alert and oriented to person, place, and time.  Awake and appropriate.  Good grip strength bilaterally.  Face symmetric.  Eye movements intact.  Finger-nose intact bilaterally.  Normal clear speech.  Skin: Skin is warm. Capillary refill takes less than 2 seconds.  Psychiatric: He has a normal mood and affect.     ED Treatments / Results  Labs (all labs ordered are listed, but only abnormal results are displayed) Labs Reviewed  COMPREHENSIVE METABOLIC PANEL - Abnormal;  Notable for the following components:      Result Value   Chloride 100 (*)    Glucose, Bld 217 (*)    BUN 21 (*)    Creatinine, Ser 1.39 (*)    Total Protein 6.2 (*)    GFR calc non Af Amer 44 (*)    GFR calc Af Amer 51 (*)    All other components within normal limits  CBC - Abnormal; Notable for the following components:   WBC 10.7 (*)    All other components within normal limits  CBG MONITORING, ED - Abnormal; Notable for the following components:   Glucose-Capillary 201 (*)    All other components within normal limits  URINALYSIS, ROUTINE W REFLEX MICROSCOPIC  ETHANOL  PROTIME-INR  APTT  RAPID URINE DRUG SCREEN, HOSP PERFORMED  I-STAT TROPONIN, ED    EKG  EKG Interpretation  Date/Time:  Sunday April 10 2017 14:14:52 EST Ventricular Rate:  79 PR Interval:    QRS Duration: 145 QT Interval:  417 QTC Calculation: 478 R Axis:   -85 Text Interpretation:  AV sequential or dual chamber electronic pacemaker Confirmed by Benjiman Core [phone]) on 04/10/2017 3:11:33 PM       Radiology Dg Chest 2 View  Result Date: 04/10/2017 CLINICAL DATA:  Altered mental status. EXAM: CHEST  2 VIEW COMPARISON:  01/06/2017. FINDINGS: Normal sized heart with a prominent left ventricular contour. Mildly tortuous and calcified thoracic aorta. Stable right diaphragmatic eventration. Clear lungs. Stable left subclavian bipolar pacemaker leads. Mild thoracic spine degenerative changes. IMPRESSION: 1. No acute abnormality. 2. Prominent left ventricular contour. This can be seen with left ventricular hypertrophy. Electronically Signed   By: Beckie Salts M.D.   On: 04/10/2017 15:36   Ct Head Wo Contrast  Result Date: 04/10/2017 CLINICAL DATA:  Confusion with some memory loss. EXAM: CT HEAD WITHOUT CONTRAST TECHNIQUE: Contiguous axial images were obtained from the base of the skull through the vertex without intravenous contrast. COMPARISON:  None. FINDINGS: Brain: Diffusely enlarged ventricles and  subarachnoid spaces. No intracranial hemorrhage, mass lesion or CT evidence of acute infarction. Vascular: No hyperdense vessel or unexpected calcification. Skull: Normal. Negative for fracture or focal lesion. Sinuses/Orbits: Unremarkable. Other: None. IMPRESSION: No acute abnormality.  Mild diffuse cerebral and cerebellar atrophy. Electronically Signed   By: Beckie Salts M.D.   On: 04/10/2017 15:21    Procedures Procedures (including critical care time)  Medications Ordered in ED Medications - No data to display   Initial Impression / Assessment and Plan / ED Course  I have reviewed the triage vital signs and the nursing notes.  Pertinent labs & imaging results that were available during my care of the patient were reviewed by me and considered in my medical decision making (see chart for details).     Patient with difficulty speaking shaking on the left side and some confusion.  Resolved.  Sugar had been checked but not exactly at this time and it was not low but probably needs TIA rule out.  Lab work reassuring.  Head CT reassuring.  Will admit to hospitalist.  Final Clinical Impressions(s) / ED Diagnoses   Final diagnoses:  TIA (transient ischemic attack)    ED Discharge Orders    None       Benjiman Core, MD 04/10/17 1615

## 2017-04-10 NOTE — ED Notes (Signed)
Patient transported to CT 

## 2017-04-10 NOTE — ED Notes (Signed)
Patient back from CT and then transported to xray

## 2017-04-10 NOTE — H&P (Signed)
History and Physical    Noah MooresClyde E Pallo WJX:[id number] DOB: 04-16-1926  DOA: 04/10/2017 PCP: Deatra JamesSun, Vyvyan, MD Patient coming from: Home  Chief Complaint: Confusion and weakness of left arm  HPI: Noah MooresClyde E Mao is a 84 y.o. male with medical history significant of hypertension, diabetes type 2, sinus node dysfunction with pacemaker, arthritis and CKD stage III presented to the emergency department complaining of confusion and left arm weakness.  Patient reported that he does not remember most of the episode, therefore information obtained from wife.  Wife reported that while eating breakfast patient  began to ask we are questions and was unable to pick up his fork she has to help him feeding himself and when he attempted to feed he was missing his mouth.  This episode lasted about 20 minutes and wife insisted the patient come to the emergency room.  Patient denies any weakness, dizziness, palpitations, numbness, chest pain or shortness of breath.  He reported that daily he has been having some tremors and every once in a while some memory issues.   ED Course: Upon ED evaluation patient was back to baseline, CT head unremarkable, creatinine 1.39, WBC 10.7.  Normal chest x-ray and EKG with paced rhythm.  [facility] consulted for admission  Review of Systems:   General: no changes in body weight, no fever chills or decrease in energy.  HEENT: no blurry vision, hearing changes or sore throat Respiratory: no dyspnea, coughing, wheezing CV: no chest pain, no palpitations GI: no nausea, vomiting, abdominal pain, diarrhea, constipation GU: no dysuria, burning on urination, increased urinary frequency, hematuria  Ext:. No deformities,  Neuro: See HPI, also + tremors and memory loss  Skin: No rashes, lesions or wounds. MSK: No muscle spasm, no deformity, no limitation of range of movement in spin Heme: No easy bruising.  Travel history: No recent long distant travel.   Past Medical History:    Diagnosis Date  . Arthritis    "knees, hands" (10/10/2015)  . History of blood transfusion 1970s   "related to work related injury; broke rib; lost blood"  . History of gout   . Hypertension   . Kidney stones   . Presence of permanent cardiac pacemaker   . Type II diabetes mellitus (HCC)     Past Surgical History:  Procedure Laterality Date  . CATARACT EXTRACTION Right 2015  . CYSTOSCOPY W/ STONE MANIPULATION  1970s  . EP IMPLANTABLE DEVICE N/A 10/10/2015   Procedure: Pacemaker Implant;  Surgeon: Marinus MawGregg W Taylor, MD;  [facility];  Service: Cardiovascular;  Laterality: N/A;  . INSERT / REPLACE / REMOVE PACEMAKER  10/10/2015     reports that he has quit smoking. His smoking use included pipe and cigars. He quit after 30.00 years of use. He has quit using smokeless tobacco. His smokeless tobacco use included chew. He reports that he does not drink alcohol or use drugs.  Allergies  Allergen Reactions  . Atenolol Other (See Comments)    Dizzy, nausea, headache  . Dilacor Xr [Diltiazem Hcl] Other (See Comments)    NOT EFFECTIVE  . Isoptin Sr [Verapamil Hcl Er]     Erectile dysfunction  . Lopressor [Metoprolol Tartrate] Other (See Comments)    Nausea, dizzy, headache  . Norvasc [Amlodipine Besylate] Other (See Comments)    Headache and sexual dysfunction    Family History  Problem Relation Age of Onset  . Hypertension Father   . Diabetes Mother     Prior to Admission medications  Medication Sig Start Date End Date Taking? Authorizing Provider  Acetaminophen-Caffeine (EXCEDRIN TENSION HEADACHE) 500-65 MG TABS Take 2 tablets by mouth daily as needed (headache).   Yes [provider]  allopurinol (ZYLOPRIM) 300 MG tablet Take 300 mg by mouth daily.  09/30/13  Yes [provider]  aspirin EC 81 MG tablet Take 81 mg by mouth daily.   Yes [provider]  felodipine (PLENDIL) 5 MG 24 hr tablet Take 5 mg by mouth daily.  09/17/13  Yes  [provider]  fluticasone (FLONASE) 50 MCG/ACT nasal spray Place 1 spray into both nostrils daily.  09/17/13  Yes [provider]  glipiZIDE (GLUCOTROL) 5 MG tablet Take 2.5 mg by mouth daily.   Yes [provider]  lisinopril-hydrochlorothiazide (PRINZIDE,ZESTORETIC) 20-25 MG per tablet Take 1 tablet by mouth daily.  09/17/13  Yes [provider]  oxybutynin (DITROPAN) 5 MG tablet Take 5 mg by mouth 2 (two) times daily. 01/26/17  Yes [provider]  oxyCODONE-acetaminophen (PERCOCET/ROXICET) 5-325 MG tablet Take 1 tablet by mouth every 6 (six) hours as needed (pain).  09/13/15  Yes [provider]  simvastatin (ZOCOR) 10 MG tablet Take 10 mg by mouth daily. 02/25/17  Yes [provider]  solifenacin (VESICARE) 10 MG tablet Take 10 mg by mouth at bedtime.   Yes [provider]  traMADol (ULTRAM) 50 MG tablet Take 50 mg by mouth 2 (two) times daily as needed (pain).   Yes [provider]  traZODone (DESYREL) 100 MG tablet Take 100 mg by mouth at bedtime. 09/12/15  Yes [provider]    Physical Exam: Vitals:   04/10/17 1357 04/10/17 1415 04/10/17 1445 04/10/17 1500  BP:  (!) 147/95 140/82 (!) 145/74  Pulse:  76 69 75  Resp:  15 13 16   Temp:      TempSrc:      SpO2:  96% 96% 95%  Weight: 73.5 kg (162 lb)     Height: 5\' 6"  (1.676 m)      Constitutional: NAD, calm, comfortable Eyes: PERRL, lids and conjunctivae normal ENMT: Mucous membranes are moist. Posterior pharynx clear of any exudate or lesions. Hearing devises Neck: normal, supple, no masses, no thyromegaly Respiratory: clear to auscultation bilaterally, no wheezing, no crackles. Normal respiratory effort.  Cardiovascular: Regular rate and rhythm, no murmurs / rubs / gallops. No extremity edema. 2+ pedal pulses.  Abdomen: no tenderness, no masses palpated. No hepatosplenomegaly. Bowel sounds positive.  Musculoskeletal: no clubbing / cyanosis.  No joint deformity upper and lower extremities. Good ROM, no contractures. Normal muscle tone.  Skin: no rashes, lesions, ulcers. No induration Neurologic: CN 2-12 grossly intact. Sensation intact, Strength 5/5 in all 4.  Psychiatric: Normal judgment and insight. Alert and oriented x 3. Normal mood.   Labs on Admission: I have personally reviewed following labs and imaging studies  CBC: Recent Labs  Lab 04/10/17 1358  WBC 10.7*  HGB 15.8  HCT 45.9  MCV 94.3  PLT 227   Basic Metabolic Panel: Recent Labs  Lab 04/10/17 1358  NA 137  K 4.0  CL 100*  CO2 27  GLUCOSE 217*  BUN 21*  CREATININE 1.39*  CALCIUM 9.2   GFR: Estimated Creatinine Clearance: 33.1 mL/min (A) (by C-G formula based on SCr of 1.39 mg/dL (H)). Liver Function Tests: Recent Labs  Lab 04/10/17 1358  AST 26  ALT 30  ALKPHOS 71  BILITOT 0.8  PROT 6.2*  ALBUMIN 3.7   No results  for input(s): LIPASE, AMYLASE in the last 168 hours. No results for input(s): AMMONIA in the last 168 hours. Coagulation Profile: Recent Labs  Lab 04/10/17 1528  INR 0.98   Cardiac Enzymes: No results for input(s): CKTOTAL, CKMB, CKMBINDEX, TROPONINI in the last 168 hours. BNP (last 3 results) No results for input(s): PROBNP in the last 8760 hours. HbA1C: No results for input(s): HGBA1C in the last 72 hours. CBG: Recent Labs  Lab 04/10/17 1421  GLUCAP 201*   Lipid Profile: No results for input(s): CHOL, HDL, LDLCALC, TRIG, CHOLHDL, LDLDIRECT in the last 72 hours. Thyroid Function Tests: No results for input(s): TSH, T4TOTAL, FREET4, T3FREE, THYROIDAB in the last 72 hours. Anemia Panel: No results for input(s): VITAMINB12, FOLATE, FERRITIN, TIBC, IRON, RETICCTPCT in the last 72 hours. Urine analysis: No results found for: COLORURINE, APPEARANCEUR, LABSPEC, PHURINE, GLUCOSEU, HGBUR, BILIRUBINUR, KETONESUR, PROTEINUR, UROBILINOGEN, NITRITE, LEUKOCYTESUR Sepsis Labs:  !!!!!!!!!!!!!!!!!!!!!!!!!!!!!!!!!!!!!!!!!!!! @LABRCNTIP (procalcitonin:4,lacticidven:4) )No results found for this or any previous visit (from the past 240 hour(s)).   Radiological Exams on Admission: Dg Chest 2 View  Result Date: 04/10/2017 CLINICAL DATA:  Altered mental status. EXAM: CHEST  2 VIEW COMPARISON:  01/06/2017. FINDINGS: Normal sized heart with a prominent left ventricular contour. Mildly tortuous and calcified thoracic aorta. Stable right diaphragmatic eventration. Clear lungs. Stable left subclavian bipolar pacemaker leads. Mild thoracic spine degenerative changes. IMPRESSION: 1. No acute abnormality. 2. Prominent left ventricular contour. This can be seen with left ventricular hypertrophy. Electronically Signed   By: Beckie Salts M.D.   On: 04/10/2017 15:36   Ct Head Wo Contrast  Result Date: 04/10/2017 CLINICAL DATA:  Confusion with some memory loss. EXAM: CT HEAD WITHOUT CONTRAST TECHNIQUE: Contiguous axial images were obtained from the base of the skull through the vertex without intravenous contrast. COMPARISON:  None. FINDINGS: Brain: Diffusely enlarged ventricles and subarachnoid spaces. No intracranial hemorrhage, mass lesion or CT evidence of acute infarction. Vascular: No hyperdense vessel or unexpected calcification. Skull: Normal. Negative for fracture or focal lesion. Sinuses/Orbits: Unremarkable. Other: None. IMPRESSION: No acute abnormality.  Mild diffuse cerebral and cerebellar atrophy. Electronically Signed   By: Beckie Salts M.D.   On: 04/10/2017 15:21    EKG: Independently reviewed.  Paced rhythm  Assessment/Plan Left-sided weakness and confusion r/o TIA vs stroke - symptoms resolved back to baseline Initial CT with no acute abnormalities Will place in observation on telemetry MRI of the brain - Patient has a pacemaker, [employer] was contacted and confirmed that pacemaker is MRI conditional.  I have requested pacemaker interrogation, there was interrogated in the  morning Echo, carotid Doppler, A1c and lipid panel Prior to admission on aspirin 81 mg to increase to 325 mg daily Increase statin to 20 mg   Hypertension Hold antihypertensives for now Monitor BP  Diabetes mellitus type 2 On glipizide at home will resume Check A1c  Status post pacemaker on 2017 due to sinus node dysfunction Pacemaker interrogation  CKD stage III Unclear baseline creatinine 2017 1.82 Patient on gentle hydration Check creatinine in the morning  Arthritis Continue home pain medications  DVT prophylaxis: Heparin subq  Code Status: Full code Family Communication: Family at bedside Disposition Plan: Anticipate discharge to previous home environment.  Consults called: None Admission status: Obs/telemetry  Latrelle Dodrill MD [facility] Pager: Text Page via [email or web address]  [phone]  If 7PM-7AM, please contact night-coverage [email or web address]  04/10/2017, 5:31 PM

## 2017-04-11 ENCOUNTER — Telehealth: Payer: Self-pay | Admitting: Internal Medicine

## 2017-04-11 ENCOUNTER — Observation Stay (HOSPITAL_BASED_OUTPATIENT_CLINIC_OR_DEPARTMENT_OTHER): Payer: Medicare HMO

## 2017-04-11 ENCOUNTER — Observation Stay (HOSPITAL_COMMUNITY): Payer: Medicare HMO

## 2017-04-11 DIAGNOSIS — G459 Transient cerebral ischemic attack, unspecified: Secondary | ICD-10-CM

## 2017-04-11 DIAGNOSIS — E119 Type 2 diabetes mellitus without complications: Secondary | ICD-10-CM | POA: Diagnosis not present

## 2017-04-11 DIAGNOSIS — I10 Essential (primary) hypertension: Secondary | ICD-10-CM | POA: Diagnosis not present

## 2017-04-11 DIAGNOSIS — N183 Chronic kidney disease, stage 3 (moderate): Secondary | ICD-10-CM | POA: Diagnosis not present

## 2017-04-11 DIAGNOSIS — R410 Disorientation, unspecified: Secondary | ICD-10-CM | POA: Diagnosis not present

## 2017-04-11 LAB — CBC WITH DIFFERENTIAL/PLATELET
Basophils Absolute: 0 10*3/uL (ref 0.0–0.1)
Basophils Relative: 0 %
Eosinophils Absolute: 0.8 10*3/uL — ABNORMAL HIGH (ref 0.0–0.7)
Eosinophils Relative: 8 %
HCT: 45 % (ref 39.0–52.0)
Hemoglobin: 15.3 g/dL (ref 13.0–17.0)
Lymphocytes Relative: 26 %
Lymphs Abs: 2.6 10*3/uL (ref 0.7–4.0)
MCH: 32.3 pg (ref 26.0–34.0)
MCHC: 34 g/dL (ref 30.0–36.0)
MCV: 94.9 fL (ref 78.0–100.0)
Monocytes Absolute: 0.8 10*3/uL (ref 0.1–1.0)
Monocytes Relative: 9 %
Neutro Abs: 5.6 10*3/uL (ref 1.7–7.7)
Neutrophils Relative %: 57 %
Platelets: 223 10*3/uL (ref 150–400)
RBC: 4.74 MIL/uL (ref 4.22–5.81)
RDW: 13.4 % (ref 11.5–15.5)
WBC: 9.9 10*3/uL (ref 4.0–10.5)

## 2017-04-11 LAB — LIPID PANEL
Cholesterol: 109 mg/dL (ref 0–200)
HDL: 43 mg/dL (ref >40)
LDL Cholesterol: 39 mg/dL (ref 0–99)
Total CHOL/HDL Ratio: 2.5 RATIO
Triglycerides: 136 mg/dL (ref <150)
VLDL: 27 mg/dL (ref 0–40)

## 2017-04-11 LAB — ECHOCARDIOGRAM COMPLETE
Height: 66 in
Weight: 2592 oz

## 2017-04-11 LAB — HEMOGLOBIN A1C
Hgb A1c MFr Bld: 7.3 % — ABNORMAL HIGH (ref 4.8–5.6)
Mean Plasma Glucose: 162.81 mg/dL

## 2017-04-11 LAB — COMPREHENSIVE METABOLIC PANEL
ALT: 25 U/L (ref 17–63)
AST: 23 U/L (ref 15–41)
Albumin: 3.4 g/dL — ABNORMAL LOW (ref 3.5–5.0)
Alkaline Phosphatase: 71 U/L (ref 38–126)
Anion gap: 14 (ref 5–15)
BUN: 19 mg/dL (ref 6–20)
CO2: 27 mmol/L (ref 22–32)
Calcium: 9 mg/dL (ref 8.9–10.3)
Chloride: 96 mmol/L — ABNORMAL LOW (ref 101–111)
Creatinine, Ser: 1.4 mg/dL — ABNORMAL HIGH (ref 0.61–1.24)
GFR calc Af Amer: 50 mL/min — ABNORMAL LOW (ref >60)
GFR calc non Af Amer: 43 mL/min — ABNORMAL LOW (ref >60)
Glucose, Bld: 130 mg/dL — ABNORMAL HIGH (ref 65–99)
Potassium: 3.6 mmol/L (ref 3.5–5.1)
Sodium: 137 mmol/L (ref 135–145)
Total Bilirubin: 0.8 mg/dL (ref 0.3–1.2)
Total Protein: 5.9 g/dL — ABNORMAL LOW (ref 6.5–8.1)

## 2017-04-11 MED ORDER — ASPIRIN 325 MG PO TBEC: 325.0000 mg | DELAYED_RELEASE_TABLET | Freq: Every day | ORAL | 0 refills | Status: DC

## 2017-04-11 NOTE — Progress Notes (Signed)
OT Screen    04/11/17 0900  OT Visit Information  Last OT Received On 04/11/17  Reason Eval/Treat Not Completed OT screened, no needs identified, will sign off. Pt at performing functional mobility at baseline level per PT. Discussed with pt and pt's wife on his functional status, pt reports he feels at baseline and all weakness has resolved. No further acute OT needs. Thank you.   Tayon Parekh MSOT, OTR/L Acute Rehab Pager: [phone] Office: [phone]

## 2017-04-11 NOTE — Progress Notes (Signed)
Pt given discharge instructions. Taken down via wheelchair by NT. Family has all of pt. belongings.

## 2017-04-11 NOTE — Care Management Note (Signed)
Case Management Note  Patient Details  Name: Marcello MooresClyde E Guzy MRN: [id number] Date of Birth: February 12, 1927  Subjective/Objective:     Pt in to r/o CVA. He is from home with Spouse.               Action/Plan: No f/u per PT. Awaiting OT eval. CM following for d/c needs, physician orders.   Expected Discharge Date:                  Expected Discharge Plan:  Home/Self Care  In-House Referral:     Discharge planning Services     Post Acute Care Choice:    Choice offered to:     DME Arranged:    DME Agency:     HH Arranged:    HH Agency:     Status of Service:  In process, will continue to follow  If discussed at Long Length of Stay Meetings, dates discussed:    Additional Comments:  Kermit BaloKelli F Kashvi Prevette, RN 04/11/2017, 10:35 AM

## 2017-04-11 NOTE — Progress Notes (Signed)
Patient alert and oriented x 4, skin assessment done by this nurse, no pressure ulcer seen on head to toe assessment

## 2017-04-11 NOTE — Discharge Summary (Signed)
Physician Discharge Summary  Noah Adkins  ZOX:[id number]  DOB: 08/09/1926  DOA: 04/10/2017 PCP: Deatra James, MD  Admit date: 04/10/2017 Discharge date: 04/11/2017  Admitted From: Home  Disposition:  Home   Recommendations for Outpatient Follow-up:  1. Follow up with PCP in 1-2 weeks 2. Please obtain BMP/CBC in one week to monitor Cr and Hgb   Discharge Condition: Stable   CODE STATUS: Full Code  Diet recommendation: Heart Healthy Carb Modified   Brief/Interim Summary: For full details see H&P but in brief, Noah Adkins is a 84 y.o. male with medical history significant of hypertension, diabetes type 2, sinus node dysfunction with pacemaker, arthritis and CKD stage III presented to the emergency department complaining of confusion and left arm weakness. Patient was admitted to r/o TIA vs stroke. Symptoms spontaneously resolved. Work up include ECHO, Carotid doppler and MRI with no acute findings. Patient is back to baseline and was deemed stable to be discharge home to follow up with PCP.   Subjective: Patients seen and examined, continue to do well, he is  Back to baseline. No new concerns. Tolerating diet well, ambulating with no issues and afebrile.    Discharge Diagnoses/Hospital Course:  Left-sided weakness and confusion felt to be TIA - symptoms resolved back to baseline Initial CT with no acute abnormalities, MRI with no acute changes  Echo no new changes EF 45-50%, carotid Doppler normal.  Prior to admission on aspirin 81 mg to increased to 325 mg daily Continue statin  No PT or OT follow up needed   Hypertension Resume home medications with no changes   Diabetes mellitus type 2 A1C 7.3 Continue Glipizide  Follow up with PCP   Status post pacemaker on 2017 due to sinus node dysfunction No events on pacemaker   CKD stage III Unclear baseline creatinine 2017 1.82  Cr at baseline now  Check creatinine in 1 week   Arthritis Continue home pain  medications  All other chronic medical condition were stable during the hospitalization.  Patient was seen by physical therapy, with no further recommendations  On the day of the discharge the patient's vitals were stable, and no other acute medical condition were reported by patient. the patient was felt safe to be discharge to home   Discharge Instructions  You were cared for by a hospitalist during your hospital stay. If you have any questions about your discharge medications or the care you received while you were in the hospital after you are discharged, you can call the unit and asked to speak with the hospitalist on call if the hospitalist that took care of you is not available. Once you are discharged, your primary care physician will handle any further medical issues. Please note that NO REFILLS for any discharge medications will be authorized once you are discharged, as it is imperative that you return to your primary care physician (or establish a relationship with a primary care physician if you do not have one) for your aftercare needs so that they can reassess your need for medications and monitor your lab values.  Discharge Instructions    Call MD for:  difficulty breathing, headache or visual disturbances   Complete by:  As directed    Call MD for:  extreme fatigue   Complete by:  As directed    Call MD for:  hives   Complete by:  As directed    Call MD for:  persistant dizziness or light-headedness   Complete by:  As  directed    Call MD for:  persistant nausea and vomiting   Complete by:  As directed    Call MD for:  redness, tenderness, or signs of infection (pain, swelling, redness, odor or green/yellow discharge around incision site)   Complete by:  As directed    Call MD for:  severe uncontrolled pain   Complete by:  As directed    Call MD for:  temperature >100.4   Complete by:  As directed    Diet - low sodium heart healthy   Complete by:  As directed    Increase  activity slowly   Complete by:  As directed      Allergies as of 04/11/2017      Reactions   Atenolol Other (See Comments)   Dizzy, nausea, headache   Dilacor Xr [diltiazem Hcl] Other (See Comments)   NOT EFFECTIVE   Isoptin Sr [verapamil Hcl Er]    Erectile dysfunction   Lopressor [metoprolol Tartrate] Other (See Comments)   Nausea, dizzy, headache   Norvasc [amlodipine Besylate] Other (See Comments)   Headache and sexual dysfunction      Medication List    TAKE these medications   allopurinol 300 MG tablet Commonly known as:  ZYLOPRIM Take 300 mg by mouth daily.   aspirin 325 MG EC tablet Take 1 tablet (325 mg total) by mouth daily. Start taking on:  04/12/2017 What changed:    medication strength  how much to take   EXCEDRIN TENSION HEADACHE 500-65 MG Tabs Generic drug:  Acetaminophen-Caffeine Take 2 tablets by mouth daily as needed (headache).   felodipine 5 MG 24 hr tablet Commonly known as:  PLENDIL Take 5 mg by mouth daily.   fluticasone 50 MCG/ACT nasal spray Commonly known as:  FLONASE Place 1 spray into both nostrils daily.   glipiZIDE 5 MG tablet Commonly known as:  GLUCOTROL Take 2.5 mg by mouth daily.   lisinopril-hydrochlorothiazide 20-25 MG tablet Commonly known as:  PRINZIDE,ZESTORETIC Take 1 tablet by mouth daily.   oxybutynin 5 MG tablet Commonly known as:  DITROPAN Take 5 mg by mouth 2 (two) times daily.   oxyCODONE-acetaminophen 5-325 MG tablet Commonly known as:  PERCOCET/ROXICET Take 1 tablet by mouth every 6 (six) hours as needed (pain).   simvastatin 10 MG tablet Commonly known as:  ZOCOR Take 10 mg by mouth daily.   solifenacin 10 MG tablet Commonly known as:  VESICARE Take 10 mg by mouth at bedtime.   traMADol 50 MG tablet Commonly known as:  ULTRAM Take 50 mg by mouth 2 (two) times daily as needed (pain).   traZODone 100 MG tablet Commonly known as:  DESYREL Take 100 mg by mouth at bedtime.      Follow-up  Information    Deatra James, MD. Schedule an appointment as soon as possible for a visit in 1 week(s).   Specialty:  Family Medicine Why:  Hospital follow up  Contact information: [address] [place] [address] [phone]          Allergies  Allergen Reactions  . Atenolol Other (See Comments)    Dizzy, nausea, headache  . Dilacor Xr [Diltiazem Hcl] Other (See Comments)    NOT EFFECTIVE  . Isoptin Sr [Verapamil Hcl Er]     Erectile dysfunction  . Lopressor [Metoprolol Tartrate] Other (See Comments)    Nausea, dizzy, headache  . Norvasc [Amlodipine Besylate] Other (See Comments)    Headache and sexual dysfunction  Consultations:  None    Procedures/Studies: Dg Chest 2 View  Result Date: 04/10/2017 CLINICAL DATA:  Altered mental status. EXAM: CHEST  2 VIEW COMPARISON:  01/06/2017. FINDINGS: Normal sized heart with a prominent left ventricular contour. Mildly tortuous and calcified thoracic aorta. Stable right diaphragmatic eventration. Clear lungs. Stable left subclavian bipolar pacemaker leads. Mild thoracic spine degenerative changes. IMPRESSION: 1. No acute abnormality. 2. Prominent left ventricular contour. This can be seen with left ventricular hypertrophy. Electronically Signed   By: Beckie SaltsSteven  Reid M.D.   On: 04/10/2017 15:36   Ct Head Wo Contrast  Result Date: 04/10/2017 CLINICAL DATA:  Confusion with some memory loss. EXAM: CT HEAD WITHOUT CONTRAST TECHNIQUE: Contiguous axial images were obtained from the base of the skull through the vertex without intravenous contrast. COMPARISON:  None. FINDINGS: Brain: Diffusely enlarged ventricles and subarachnoid spaces. No intracranial hemorrhage, mass lesion or CT evidence of acute infarction. Vascular: No hyperdense vessel or unexpected calcification. Skull: Normal. Negative for fracture or focal lesion. Sinuses/Orbits: Unremarkable. Other: None. IMPRESSION: No acute abnormality.  Mild diffuse cerebral and  cerebellar atrophy. Electronically Signed   By: Beckie SaltsSteven  Reid M.D.   On: 04/10/2017 15:21   Mr Brain Wo Contrast  Result Date: 04/11/2017 CLINICAL DATA:  Hypertension and diabetes. Confusion left arm weakness. Acute presentation. EXAM: MRI HEAD WITHOUT CONTRAST TECHNIQUE: Multiplanar, multiecho pulse sequences of the brain and surrounding structures were obtained without intravenous contrast. COMPARISON:  CT 04/10/2017. FINDINGS: Brain: Diffusion imaging does not show any acute or subacute infarction. The brainstem and cerebellum are normal. Cerebral hemispheres show generalized atrophy with mild chronic small-vessel ischemic change of the hemispheric white matter. No cortical or large vessel territory infarction. Normal variant dilated perivascular spaces at the base of the brain. Vascular: Major vessels at the base of the brain show flow. Skull and upper cervical spine: Negative Sinuses/Orbits: Clear/normal Other: None IMPRESSION: Atrophy and chronic small-vessel ischemic change of the white matter. No acute or reversible process. Electronically Signed   By: Paulina FusiMark  Shogry M.D.   On: 04/11/2017 15:30   ECHO 04/11/17 ------------------------------------------------------------------- Study Conclusions  - Left ventricle: The cavity size was normal. Wall thickness was   normal. Systolic function was mildly reduced. The estimated   ejection fraction was in the range of 45% to 50%. Wall motion was   normal; there were no regional wall motion abnormalities. There   is profound pacing-related septal-to-lateral wall and   apex-to-base dyssynchrony. Doppler parameters are consistent with   abnormal left ventricular relaxation (grade 1 diastolic   dysfunction). - Ventricular septum: Septal motion showed abnormal function,   dyssynergy, and paradox. These changes are consistent with right   ventricular pacing. - Aortic valve: There was trivial regurgitation. - Mitral valve: Calcified annulus. - Right  atrium: The atrium was mildly dilated. - Tricuspid valve: There was moderate regurgitation directed   centrally. - Pulmonary arteries: Systolic pressure was mildly to moderately   increased. PA peak pressure: 47 mm Hg (S).   Discharge Exam: Vitals:   04/11/17 1000 04/11/17 1346  BP: 128/66 132/66  Pulse: 63 70  Resp: 17 16  Temp: 97.9 F (36.6 C) 97.8 F (36.6 C)  SpO2: 96% 98%   Vitals:   04/11/17 0212 04/11/17 0525 04/11/17 1000 04/11/17 1346  BP: 128/64 115/64 128/66 132/66  Pulse: 66 64 63 70  Resp: 16 16 17 16   Temp:  98 F (36.7 C) 97.9 F (36.6 C) 97.8 F (36.6 C)  TempSrc:  Oral Oral Oral  SpO2: 100% 94% 96% 98%  Weight:      Height:       General: Pt is alert, awake, not in acute distress Cardiovascular: RRR, S1/S2 +, no rubs, no gallops Respiratory: CTA bilaterally, no wheezing, no rhonchi Abdominal: Soft, NT, ND, bowel sounds + Extremities: no edema, no cyanosis   The results of significant diagnostics from this hospitalization (including imaging, microbiology, ancillary and laboratory) are listed below for reference.     Microbiology: No results found for this or any previous visit (from the past 240 hour(s)).   Labs: BNP (last 3 results) No results for input(s): BNP in the last 8760 hours. Basic Metabolic Panel: Recent Labs  Lab 04/10/17 1358 04/11/17 0745  NA 137 137  K 4.0 3.6  CL 100* 96*  CO2 27 27  GLUCOSE 217* 130*  BUN 21* 19  CREATININE 1.39* 1.40*  CALCIUM 9.2 9.0   Liver Function Tests: Recent Labs  Lab 04/10/17 1358 04/11/17 0745  AST 26 23  ALT 30 25  ALKPHOS 71 71  BILITOT 0.8 0.8  PROT 6.2* 5.9*  ALBUMIN 3.7 3.4*   No results for input(s): LIPASE, AMYLASE in the last 168 hours. No results for input(s): AMMONIA in the last 168 hours. CBC: Recent Labs  Lab 04/10/17 1358 04/11/17 0745  WBC 10.7* 9.9  NEUTROABS  --  5.6  HGB 15.8 15.3  HCT 45.9 45.0  MCV 94.3 94.9  PLT 227 223   Cardiac Enzymes: No results  for input(s): CKTOTAL, CKMB, CKMBINDEX, TROPONINI in the last 168 hours. BNP: Invalid input(s): POCBNP CBG: Recent Labs  Lab 04/10/17 1421  GLUCAP 201*   D-Dimer No results for input(s): DDIMER in the last 72 hours. Hgb A1c Recent Labs    04/11/17 0745  HGBA1C 7.3*   Lipid Profile Recent Labs    04/11/17 0500  CHOL 109  HDL 43  LDLCALC 39  TRIG 136  CHOLHDL 2.5   Thyroid function studies No results for input(s): TSH, T4TOTAL, T3FREE, THYROIDAB in the last 72 hours.  Invalid input(s): FREET3 Anemia work up No results for input(s): VITAMINB12, FOLATE, FERRITIN, TIBC, IRON, RETICCTPCT in the last 72 hours. Urinalysis    Component Value Date/Time   COLORURINE YELLOW 04/10/2017 1735   APPEARANCEUR CLEAR 04/10/2017 1735   LABSPEC 1.013 04/10/2017 1735   PHURINE 6.0 04/10/2017 1735   GLUCOSEU NEGATIVE 04/10/2017 1735   HGBUR NEGATIVE 04/10/2017 1735   BILIRUBINUR NEGATIVE 04/10/2017 1735   KETONESUR NEGATIVE 04/10/2017 1735   PROTEINUR 30 (A) 04/10/2017 1735   NITRITE NEGATIVE 04/10/2017 1735   LEUKOCYTESUR NEGATIVE 04/10/2017 1735   Sepsis Labs Invalid input(s): PROCALCITONIN,  WBC,  LACTICIDVEN Microbiology No results found for this or any previous visit (from the past 240 hour(s)).   Time coordinating discharge: 32 minutes  SIGNED:  Latrelle Dodrill, MD  [facility] 04/11/2017, 4:32 PM  Pager please text page via  [email or web address]

## 2017-04-11 NOTE — Progress Notes (Signed)
VASCULAR LAB PRELIMINARY  PRELIMINARY  PRELIMINARY  PRELIMINARY  Carotid duplex completed.    Preliminary report:  1-39% ICA plaquing. Vertebral artery flow is antegrade.   Marliyah Reid, RVT 04/11/2017, 9:02 AM

## 2017-04-11 NOTE — Evaluation (Signed)
Physical Therapy Evaluation and Discharge  Patient Details Name: Noah Adkins MRN: [id number] DOB: 17-Jun-1926 Today's Date: 04/11/2017   History of Present Illness  Noah Adkins is a 84 y.o. male with medical history significant of hypertension, diabetes type 2, sinus node dysfunction with pacemaker, arthritis and CKD stage III presented to the emergency department complaining of confusion and left arm weakness  Clinical Impression  Pt admitted with above. Pt functioning at baseline. Pt c/o bilat knee pain due to severe arthritis. Pt functioning at indep level. Pt with no further acute PT needs at this time. PT SIGNING OFF. PLease re-consult if needed in future. Thank you for the referral.    Follow Up Recommendations No PT follow up    Equipment Recommendations  None recommended by PT    Recommendations for Other Services       Precautions / Restrictions Precautions Precautions: None Restrictions Weight Bearing Restrictions: No      Mobility  Bed Mobility               General bed mobility comments: pt up walking to bathroom upon PT arrival  Transfers Overall transfer level: Independent Equipment used: None             General transfer comment: no difficulty, safe, pt took self to/from bathroom with IV pole  Ambulation/Gait Ambulation/Gait assistance: Independent Ambulation Distance (Feet): 200 Feet Assistive device: None Gait Pattern/deviations: WFL(Within Functional Limits) Gait velocity: decreased Gait velocity interpretation: Below normal speed for age/gender General Gait Details: pt bow-legged with mild antalgia due to bilat "bad knees"  Stairs            Wheelchair Mobility    Modified Rankin (Stroke Patients Only) Modified Rankin (Stroke Patients Only) Pre-Morbid Rankin Score: No significant disability Modified Rankin: No significant disability     Balance Overall balance assessment: Independent                                Standardized Balance Assessment Standardized Balance Assessment : Dynamic Gait Index   Dynamic Gait Index Level Surface: Normal Change in Gait Speed: Mild Impairment(limited by painful knees) Gait with Horizontal Head Turns: Normal Gait with Vertical Head Turns: Normal Gait and Pivot Turn: Normal Step Over Obstacle: Mild Impairment Step Around Obstacles: Normal Steps: Mild Impairment(didn't formally test but reports using rail) Total Score: 21       Pertinent Vitals/Pain Pain Assessment: 0-10 Pain Score: 8  Pain Location: bilat knee, L worse than R, severe arthritis Pain Descriptors / Indicators: Aching Pain Intervention(s): Monitored during session    Home Living Family/patient expects to be discharged to:: Private residence Living Arrangements: Spouse/significant other Available Help at Discharge: Family;Available 24 hours/day Type of Home: Apartment Home Access: Elevator     Home Layout: One level        Prior Function Level of Independence: Independent         Comments: drives and does grocery shopping     Hand Dominance        Extremity/Trunk Assessment   Upper Extremity Assessment Upper Extremity Assessment: Overall WFL for tasks assessed    Lower Extremity Assessment Lower Extremity Assessment: Overall WFL for tasks assessed       Communication   Communication: No difficulties  Cognition Arousal/Alertness: Awake/alert Behavior During Therapy: WFL for tasks assessed/performed Overall Cognitive Status: Within Functional Limits for tasks assessed  General Comments General comments (skin integrity, edema, etc.): pt able to toliet indep    Exercises     Assessment/Plan    PT Assessment Patent does not need any further PT services  PT Problem List         PT Treatment Interventions      PT Goals (Current goals can be found in the Care Plan section)  Acute Rehab PT  Goals Patient Stated Goal: home PT Goal Formulation: All assessment and education complete, DC therapy    Frequency     Barriers to discharge        Co-evaluation               AM-PAC PT "6 Clicks" Daily Activity  Outcome Measure Difficulty turning over in bed (including adjusting bedclothes, sheets and blankets)?: None Difficulty moving from lying on back to sitting on the side of the bed? : None Difficulty sitting down on and standing up from a chair with arms (e.g., wheelchair, bedside commode, etc,.)?: None Help needed moving to and from a bed to chair (including a wheelchair)?: None Help needed walking in hospital room?: None Help needed climbing 3-5 steps with a railing? : A Little 6 Click Score: 23    End of Session Equipment Utilized During Treatment: Gait belt Activity Tolerance: Patient tolerated treatment well Patient left: in chair;with call bell/phone within reach(with Lab tech) Nurse Communication: Mobility status PT Visit Diagnosis: Unsteadiness on feet (R26.81)    Time: 2595-6387: 0733-0755 PT Time Calculation (min) (ACUTE ONLY): 22 min   Charges:   PT Evaluation $PT Eval Low Complexity: 1 Low     PT G CodesLewis Shock:       Jenise Iannelli, PT, DPT Pager #: [phone] Office #: [phone]   Cystal Shannahan M Sargent Mankey 04/11/2017, 8:32 AM

## 2017-04-12 ENCOUNTER — Ambulatory Visit (INDEPENDENT_AMBULATORY_CARE_PROVIDER_SITE_OTHER): Payer: Medicare HMO | Admitting: *Deleted

## 2017-04-12 DIAGNOSIS — I495 Sick sinus syndrome: Secondary | ICD-10-CM

## 2017-04-14 NOTE — Progress Notes (Signed)
Remote pacemaker transmission.   

## 2017-04-15 ENCOUNTER — Encounter: Payer: Self-pay | Admitting: Cardiology

## 2017-04-15 DIAGNOSIS — E113299 Type 2 diabetes mellitus with mild nonproliferative diabetic retinopathy without macular edema, unspecified eye: Secondary | ICD-10-CM | POA: Diagnosis not present

## 2017-04-15 DIAGNOSIS — I129 Hypertensive chronic kidney disease with stage 1 through stage 4 chronic kidney disease, or unspecified chronic kidney disease: Secondary | ICD-10-CM | POA: Diagnosis not present

## 2017-04-15 DIAGNOSIS — E7800 Pure hypercholesterolemia, unspecified: Secondary | ICD-10-CM | POA: Diagnosis not present

## 2017-04-15 DIAGNOSIS — R112 Nausea with vomiting, unspecified: Secondary | ICD-10-CM | POA: Diagnosis not present

## 2017-04-15 DIAGNOSIS — Z7984 Long term (current) use of oral hypoglycemic drugs: Secondary | ICD-10-CM | POA: Diagnosis not present

## 2017-04-15 DIAGNOSIS — G459 Transient cerebral ischemic attack, unspecified: Secondary | ICD-10-CM | POA: Diagnosis not present

## 2017-04-18 LAB — CUP PACEART REMOTE DEVICE CHECK
Date Time Interrogation Session: 20190121155229
Implantable Lead Implant Date: 20170714
Implantable Lead Implant Date: 20170714
Implantable Lead Location: 753859
Implantable Lead Location: 753860
Implantable Lead Model: 377
Implantable Lead Model: 377
Implantable Lead Serial Number: 49489173
Implantable Lead Serial Number: 49549002
Implantable Pulse Generator Implant Date: 20170714
Pulse Gen Model: 394969
Pulse Gen Serial Number: 68764743

## 2017-04-22 DIAGNOSIS — R1110 Vomiting, unspecified: Secondary | ICD-10-CM | POA: Diagnosis not present

## 2017-04-25 ENCOUNTER — Other Ambulatory Visit: Payer: Self-pay | Admitting: Gastroenterology

## 2017-04-25 DIAGNOSIS — R112 Nausea with vomiting, unspecified: Secondary | ICD-10-CM

## 2017-05-24 ENCOUNTER — Other Ambulatory Visit: Payer: Medicare HMO

## 2017-06-02 ENCOUNTER — Inpatient Hospital Stay: Admission: RE | Admit: 2017-06-02 | Payer: Medicare HMO | Source: Ambulatory Visit

## 2017-06-02 ENCOUNTER — Inpatient Hospital Stay
Admission: RE | Admit: 2017-06-02 | Discharge: 2017-06-02 | Disposition: A | Discharge status: Home / Self Care | Payer: Medicare HMO | Source: Ambulatory Visit | Attending: Gastroenterology | Admitting: Gastroenterology

## 2017-06-10 ENCOUNTER — Other Ambulatory Visit: Payer: Medicare HMO

## 2017-06-20 ENCOUNTER — Other Ambulatory Visit: Payer: Self-pay | Admitting: Gastroenterology

## 2017-06-20 ENCOUNTER — Ambulatory Visit
Admission: RE | Admit: 2017-06-20 | Discharge: 2017-06-20 | Disposition: A | Discharge status: Home / Self Care | Payer: Medicare HMO | Source: Ambulatory Visit | Attending: Gastroenterology | Admitting: Gastroenterology

## 2017-06-20 DIAGNOSIS — R112 Nausea with vomiting, unspecified: Secondary | ICD-10-CM

## 2017-06-20 DIAGNOSIS — N281 Cyst of kidney, acquired: Secondary | ICD-10-CM | POA: Diagnosis not present

## 2017-06-20 DIAGNOSIS — K449 Diaphragmatic hernia without obstruction or gangrene: Secondary | ICD-10-CM | POA: Diagnosis not present

## 2017-06-22 DIAGNOSIS — E1165 Type 2 diabetes mellitus with hyperglycemia: Secondary | ICD-10-CM | POA: Diagnosis not present

## 2017-06-22 DIAGNOSIS — M170 Bilateral primary osteoarthritis of knee: Secondary | ICD-10-CM | POA: Diagnosis not present

## 2017-06-22 DIAGNOSIS — G894 Chronic pain syndrome: Secondary | ICD-10-CM | POA: Diagnosis not present

## 2017-06-22 DIAGNOSIS — D72829 Elevated white blood cell count, unspecified: Secondary | ICD-10-CM | POA: Diagnosis not present

## 2017-06-22 DIAGNOSIS — Z7984 Long term (current) use of oral hypoglycemic drugs: Secondary | ICD-10-CM | POA: Diagnosis not present

## 2017-07-12 ENCOUNTER — Ambulatory Visit (INDEPENDENT_AMBULATORY_CARE_PROVIDER_SITE_OTHER): Payer: Medicare HMO | Admitting: *Deleted

## 2017-07-12 DIAGNOSIS — I495 Sick sinus syndrome: Secondary | ICD-10-CM | POA: Diagnosis not present

## 2017-07-12 NOTE — Progress Notes (Signed)
Remote pacemaker transmission.   

## 2017-07-14 ENCOUNTER — Encounter: Payer: Self-pay | Admitting: Cardiology

## 2017-07-14 NOTE — Progress Notes (Signed)
Letter  

## 2017-07-26 LAB — CUP PACEART REMOTE DEVICE CHECK
Battery Remaining Percentage: 85 %
Brady Statistic AP VP Percent: 91 %
Brady Statistic AP VS Percent: 0 %
Brady Statistic AS VP Percent: 6 %
Brady Statistic AS VS Percent: 0 %
Brady Statistic RA Percent Paced: 94 %
Brady Statistic RV Percent Paced: 97 %
Date Time Interrogation Session: 20190430055302
Implantable Lead Implant Date: 20170714
Implantable Lead Implant Date: 20170714
Implantable Lead Location: 753859
Implantable Lead Location: 753860
Implantable Lead Model: 377
Implantable Lead Model: 377
Implantable Lead Serial Number: 49489173
Implantable Lead Serial Number: 49549002
Implantable Pulse Generator Implant Date: 20170714
Lead Channel Impedance Value: 494 Ohm
Lead Channel Impedance Value: 609 Ohm
Lead Channel Pacing Threshold Amplitude: 0.7 V
Lead Channel Pacing Threshold Amplitude: 0.7 V
Lead Channel Pacing Threshold Pulse Width: 0.4 ms
Lead Channel Pacing Threshold Pulse Width: 0.4 ms
Lead Channel Setting Pacing Amplitude: 2 V
Lead Channel Setting Pacing Amplitude: 2.4 V
Lead Channel Setting Pacing Pulse Width: 0.4 ms
Pulse Gen Model: 394969
Pulse Gen Serial Number: 68764743

## 2017-08-10 NOTE — Telephone Encounter (Signed)
error 

## 2017-09-21 DIAGNOSIS — E7800 Pure hypercholesterolemia, unspecified: Secondary | ICD-10-CM | POA: Diagnosis not present

## 2017-09-21 DIAGNOSIS — N183 Chronic kidney disease, stage 3 (moderate): Secondary | ICD-10-CM | POA: Diagnosis not present

## 2017-09-21 DIAGNOSIS — E1122 Type 2 diabetes mellitus with diabetic chronic kidney disease: Secondary | ICD-10-CM | POA: Diagnosis not present

## 2017-09-21 DIAGNOSIS — G4700 Insomnia, unspecified: Secondary | ICD-10-CM | POA: Diagnosis not present

## 2017-09-21 DIAGNOSIS — I10 Essential (primary) hypertension: Secondary | ICD-10-CM | POA: Diagnosis not present

## 2017-09-21 DIAGNOSIS — E113299 Type 2 diabetes mellitus with mild nonproliferative diabetic retinopathy without macular edema, unspecified eye: Secondary | ICD-10-CM | POA: Diagnosis not present

## 2017-09-21 DIAGNOSIS — E11319 Type 2 diabetes mellitus with unspecified diabetic retinopathy without macular edema: Secondary | ICD-10-CM | POA: Diagnosis not present

## 2017-09-21 DIAGNOSIS — Z1389 Encounter for screening for other disorder: Secondary | ICD-10-CM | POA: Diagnosis not present

## 2017-09-21 DIAGNOSIS — Z0000 Encounter for general adult medical examination without abnormal findings: Secondary | ICD-10-CM | POA: Diagnosis not present

## 2017-10-11 ENCOUNTER — Ambulatory Visit (INDEPENDENT_AMBULATORY_CARE_PROVIDER_SITE_OTHER): Payer: Medicare HMO | Admitting: *Deleted

## 2017-10-11 DIAGNOSIS — I495 Sick sinus syndrome: Secondary | ICD-10-CM

## 2017-10-11 NOTE — Progress Notes (Signed)
Remote pacemaker transmission.   

## 2017-10-12 ENCOUNTER — Encounter: Payer: Self-pay | Admitting: Cardiology

## 2017-10-14 LAB — CUP PACEART REMOTE DEVICE CHECK
Battery Remaining Percentage: 85 %
Brady Statistic AP VP Percent: 96 %
Brady Statistic AP VS Percent: 0 %
Brady Statistic AS VP Percent: 4 %
Brady Statistic AS VS Percent: 0 %
Brady Statistic RA Percent Paced: 96 %
Brady Statistic RV Percent Paced: 100 %
Date Time Interrogation Session: 20190719054145
Implantable Lead Implant Date: 20170714
Implantable Lead Implant Date: 20170714
Implantable Lead Location: 753859
Implantable Lead Location: 753860
Implantable Lead Model: 377
Implantable Lead Model: 377
Implantable Lead Serial Number: 49489173
Implantable Lead Serial Number: 49549002
Implantable Pulse Generator Implant Date: 20170714
Lead Channel Impedance Value: 494 Ohm
Lead Channel Impedance Value: 604 Ohm
Lead Channel Pacing Threshold Amplitude: 0.7 V
Lead Channel Pacing Threshold Amplitude: 0.7 V
Lead Channel Pacing Threshold Pulse Width: 0.4 ms
Lead Channel Pacing Threshold Pulse Width: 0.4 ms
Lead Channel Setting Pacing Amplitude: 2 V
Lead Channel Setting Pacing Amplitude: 2.4 V
Lead Channel Setting Pacing Pulse Width: 0.4 ms
Pulse Gen Model: 394969
Pulse Gen Serial Number: 68764743

## 2017-11-11 ENCOUNTER — Other Ambulatory Visit: Payer: Self-pay

## 2017-11-11 ENCOUNTER — Emergency Department (HOSPITAL_COMMUNITY)
Admission: EM | Admit: 2017-11-11 | Discharge: 2017-11-12 | Disposition: A | Discharge status: Home / Self Care | Payer: Medicare HMO | Attending: Emergency Medicine | Admitting: Emergency Medicine

## 2017-11-11 ENCOUNTER — Encounter (HOSPITAL_COMMUNITY): Payer: Self-pay | Admitting: *Deleted

## 2017-11-11 ENCOUNTER — Emergency Department (HOSPITAL_COMMUNITY): Payer: Medicare HMO

## 2017-11-11 DIAGNOSIS — R413 Other amnesia: Secondary | ICD-10-CM | POA: Diagnosis not present

## 2017-11-11 DIAGNOSIS — Z87891 Personal history of nicotine dependence: Secondary | ICD-10-CM | POA: Insufficient documentation

## 2017-11-11 DIAGNOSIS — R42 Dizziness and giddiness: Secondary | ICD-10-CM | POA: Diagnosis not present

## 2017-11-11 DIAGNOSIS — R200 Anesthesia of skin: Secondary | ICD-10-CM | POA: Diagnosis not present

## 2017-11-11 DIAGNOSIS — E119 Type 2 diabetes mellitus without complications: Secondary | ICD-10-CM | POA: Diagnosis not present

## 2017-11-11 DIAGNOSIS — G319 Degenerative disease of nervous system, unspecified: Secondary | ICD-10-CM | POA: Diagnosis not present

## 2017-11-11 DIAGNOSIS — Z79899 Other long term (current) drug therapy: Secondary | ICD-10-CM | POA: Insufficient documentation

## 2017-11-11 DIAGNOSIS — I10 Essential (primary) hypertension: Secondary | ICD-10-CM | POA: Diagnosis not present

## 2017-11-11 DIAGNOSIS — Z7982 Long term (current) use of aspirin: Secondary | ICD-10-CM | POA: Insufficient documentation

## 2017-11-11 DIAGNOSIS — R410 Disorientation, unspecified: Secondary | ICD-10-CM | POA: Diagnosis not present

## 2017-11-11 LAB — COMPREHENSIVE METABOLIC PANEL
ALT: 12 U/L (ref 0–44)
AST: 19 U/L (ref 15–41)
Albumin: 3.7 g/dL (ref 3.5–5.0)
Alkaline Phosphatase: 77 U/L (ref 38–126)
Anion gap: 10 (ref 5–15)
BUN: 27 mg/dL — ABNORMAL HIGH (ref 8–23)
CO2: 30 mmol/L (ref 22–32)
Calcium: 9.1 mg/dL (ref 8.9–10.3)
Chloride: 95 mmol/L — ABNORMAL LOW (ref 98–111)
Creatinine, Ser: 1.81 mg/dL — ABNORMAL HIGH (ref 0.61–1.24)
GFR calc Af Amer: 37 mL/min — ABNORMAL LOW (ref >60)
GFR calc non Af Amer: 32 mL/min — ABNORMAL LOW (ref >60)
Glucose, Bld: 77 mg/dL (ref 70–99)
Potassium: 3.6 mmol/L (ref 3.5–5.1)
Sodium: 135 mmol/L (ref 135–145)
Total Bilirubin: 0.7 mg/dL (ref 0.3–1.2)
Total Protein: 6.5 g/dL (ref 6.5–8.1)

## 2017-11-11 LAB — URINALYSIS, ROUTINE W REFLEX MICROSCOPIC
Bacteria, UA: NONE SEEN
Bilirubin Urine: NEGATIVE
Glucose, UA: 50 mg/dL — AB
Ketones, ur: NEGATIVE mg/dL
Leukocytes, UA: NEGATIVE
Nitrite: NEGATIVE
Protein, ur: 30 mg/dL — AB
Specific Gravity, Urine: 1.014 (ref 1.005–1.030)
pH: 5 (ref 5.0–8.0)

## 2017-11-11 LAB — CBC
HCT: 46.8 % (ref 39.0–52.0)
Hemoglobin: 16.1 g/dL (ref 13.0–17.0)
MCH: 32.3 pg (ref 26.0–34.0)
MCHC: 34.4 g/dL (ref 30.0–36.0)
MCV: 93.8 fL (ref 78.0–100.0)
Platelets: 240 10*3/uL (ref 150–400)
RBC: 4.99 MIL/uL (ref 4.22–5.81)
RDW: 13 % (ref 11.5–15.5)
WBC: 8.6 10*3/uL (ref 4.0–10.5)

## 2017-11-11 LAB — DIFFERENTIAL
Abs Immature Granulocytes: 0 10*3/uL (ref 0.0–0.1)
Basophils Absolute: 0.1 10*3/uL (ref 0.0–0.1)
Basophils Relative: 1 %
Eosinophils Absolute: 0.3 10*3/uL (ref 0.0–0.7)
Eosinophils Relative: 3 %
Immature Granulocytes: 0 %
Lymphocytes Relative: 21 %
Lymphs Abs: 1.8 10*3/uL (ref 0.7–4.0)
Monocytes Absolute: 0.9 10*3/uL (ref 0.1–1.0)
Monocytes Relative: 10 %
Neutro Abs: 5.6 10*3/uL (ref 1.7–7.7)
Neutrophils Relative %: 65 %

## 2017-11-11 LAB — I-STAT TROPONIN, ED: Troponin i, poc: 0.02 ng/mL (ref 0.00–0.08)

## 2017-11-11 LAB — PROTIME-INR
INR: 0.92
Prothrombin Time: 12.3 seconds (ref 11.4–15.2)

## 2017-11-11 LAB — APTT: aPTT: 38 seconds — ABNORMAL HIGH (ref 24–36)

## 2017-11-11 NOTE — ED Triage Notes (Signed)
Pt sent here by pcp for numbness to top of head, generalized weakness, forgetfulness and dizziness x 2 weeks.  CBC and urine dip performed at MD's office today.

## 2017-11-11 NOTE — ED Provider Notes (Signed)
Patient placed in Quick Look pathway, seen and evaluated   Chief Complaint: Headache, forgetfulness, generalized weakness  HPI: Patient presents with headache and scalp numbness over the past couple of weeks.  Per family he is also been generally weak and forgetful at times which is unusual for him.  He ambulates at his baseline. Patient denies signs of stroke including: facial droop, slurred speech, aphasia, unilateral weakness/numbness in extremities, imbalance/trouble walking.  No fevers, nausea or vomiting.  Patient went to walk-in clinic today and had a CBC, UA, and chest x-ray which were repeatedly normal.  Sent here for consideration of head imaging.  ROS:  Positive ROS: (+) Headache Negative ROS: (-) Fever  Physical Exam:   Gen: No distress  Neuro: Awake and Alert  Skin: Warm    Focused Exam: Heart RRR, nml S1,S2, no m/r/g; Lungs CTAB; Abd soft, NT, no rebound or guarding; Ext 2+ pedal pulses bilaterally, no edema; neuro cranial nerves II through XII grossly intact, no unilateral weakness, normal distal sensation.  BP 111/73 (BP Location: Left Arm)   Pulse 66   Temp 98.1 F (36.7 C) (Oral)   Resp 16   Ht 5\' 6"  (1.676 m)   Wt 73.5 kg   SpO2 97%   BMI 26.15 kg/m   Plan: Labs and head CT ordered.  No code stroke.  No focal neurological deficits at the current time.  Initiation of care has begun. The patient has been counseled on the process, plan, and necessity for staying for the completion/evaluation, and the remainder of the medical screening examination    Renne CriglerGeiple, Orvel Cutsforth, Cordelia Poche-C 11/11/17 1729    Gerhard MunchLockwood, Robert, MD 11/13/17 0000

## 2017-11-11 NOTE — ED Provider Notes (Signed)
[facility] EMERGENCY DEPARTMENT Provider Note   CSN: [id number] Arrival date & time: 11/11/17  1652     History   Chief Complaint Chief Complaint  Patient presents with  . Dizziness  . Numbness    for 2 weeks    HPI Noah Adkins is a 84 y.o. male.  Patient presents to the emergency department for evaluation of numbness of the top of his head for 2 weeks.  He has been experiencing intermittent right parietal headache.  This usually happens in the morning and then resolves.  Family reports that he has intermittently exhibited some confusion.  He also has been complaining of frequent dizziness.  He has not had any speech disturbance or unilateral neurologic symptoms.  He had a TIA with left-sided weakness in January, this does not feel similar.     Past Medical History:  Diagnosis Date  . Arthritis    "knees, hands" (10/10/2015)  . History of blood transfusion 1970s   "related to work related injury; broke rib; lost blood"  . History of gout   . Hypertension   . Kidney stones   . Presence of permanent cardiac pacemaker   . Type II diabetes mellitus [facility])     Patient Active Problem List   Diagnosis Date Noted  . TIA (transient ischemic attack) 04/10/2017  . Syncopal seizure (HCC)   . Syncope 10/10/2015  . Symptomatic bradycardia 10/10/2015  . Postural hypotension 10/30/2013  . Preop cardiovascular exam 10/30/2013  . RBBB 10/30/2013  . DM (diabetes mellitus) type I controlled with renal manifestation (HCC) 10/30/2013    Past Surgical History:  Procedure Laterality Date  . CATARACT EXTRACTION Right 2015  . CYSTOSCOPY W/ STONE MANIPULATION  1970s  . EP IMPLANTABLE DEVICE N/A 10/10/2015   Procedure: Pacemaker Implant;  Surgeon: Marinus Maw, MD;  Location: [facility] INVASIVE CV LAB;  Service: Cardiovascular;  Laterality: N/A;  . INSERT / REPLACE / REMOVE PACEMAKER  10/10/2015        Home Medications    Prior to Admission medications   Medication  Sig Start Date End Date Taking? Authorizing Provider  allopurinol (ZYLOPRIM) 300 MG tablet Take 300 mg by mouth daily.  09/30/13  Yes [provider]  aspirin EC 325 MG EC tablet Take 1 tablet (325 mg total) by mouth daily. 04/12/17  Yes Randel Pigg, Dorma Russell, MD  diphenhydrAMINE (BENADRYL) 25 mg capsule Take 25 mg by mouth every 6 (six) hours as needed for itching, allergies or sleep.   Yes [provider]  felodipine (PLENDIL) 5 MG 24 hr tablet Take 5 mg by mouth daily.  09/17/13  Yes [provider]  fluticasone (FLONASE) 50 MCG/ACT nasal spray Place 1 spray into both nostrils daily.  09/17/13  Yes [provider]  glipiZIDE (GLUCOTROL XL) 2.5 MG 24 hr tablet Take 2.5 mg by mouth daily. 10/17/17  Yes [provider]  guaiFENesin-dextromethorphan (ROBITUSSIN DM) 100-10 MG/5ML syrup Take 5 mLs by mouth every 4 (four) hours as needed for cough.   Yes [provider]  ibuprofen (ADVIL,MOTRIN) 200 MG tablet Take 200-400 mg by mouth every 6 (six) hours as needed for fever, headache, mild pain, moderate pain or cramping.   Yes [provider]  lisinopril-hydrochlorothiazide (PRINZIDE,ZESTORETIC) 20-25 MG per tablet Take 1 tablet by mouth daily.  09/17/13  Yes [provider]  naproxen sodium (ALEVE) 220 MG tablet Take 220 mg by mouth 2 (two) times daily as needed (pain).   Yes [provider]  omeprazole (PRILOSEC) 40 MG capsule Take 40 mg by mouth daily. 09/20/17  Yes [provider]  oxybutynin (DITROPAN) 5 MG tablet Take 5 mg by mouth 2 (two) times daily. 01/26/17  Yes [provider]  oxyCODONE-acetaminophen (PERCOCET/ROXICET) 5-325 MG tablet Take 1 tablet by mouth every 6 (six) hours as needed (pain).  09/13/15  Yes [provider]  simvastatin (ZOCOR) 10 MG tablet Take 10 mg by mouth at bedtime.  02/25/17  Yes [provider]  solifenacin (VESICARE) 10 MG tablet Take 10 mg by mouth at bedtime.    Yes [provider]  tetrahydrozoline 0.05 % ophthalmic solution Place 1 drop into both eyes as needed (dry eyes).   Yes [provider]  traMADol (ULTRAM) 50 MG tablet Take 50 mg by mouth 2 (two) times daily as needed (pain).   Yes [provider]  traZODone (DESYREL) 150 MG tablet Take 150 mg by mouth at bedtime. 09/22/17  Yes [provider]    Family History Family History  Problem Relation Age of Onset  . Hypertension Father   . Diabetes Mother     Social History Social History   Tobacco Use  . Smoking status: Former Smoker    Years: 30.00    Types: Pipe, Cigars  . Smokeless tobacco: Former NeurosurgeonUser    Types: Chew  . Tobacco comment: 10/10/2015 "quit smoking cigarettes & chewing tobacco in the 1980s"  Substance Use Topics  . Alcohol use: No    Alcohol/week: 0.0 standard drinks    Frequency: Never    Comment: 10/10/2015 "last year I had 2 beers"  . Drug use: No     Allergies   Atenolol; Dilacor xr [diltiazem hcl]; Isoptin sr [verapamil hcl er]; Lopressor [metoprolol tartrate]; and Norvasc [amlodipine besylate]   Review of Systems Review of Systems  Neurological: Positive for dizziness and headaches.  Psychiatric/Behavioral: Positive for confusion.  All other systems reviewed and are negative.    Physical Exam Updated Vital Signs BP (!) 144/68   Pulse 67   Temp 98.6 F (37 C)   Resp 17   Ht 5\' 6"  (1.676 m)   Wt 73.5 kg   SpO2 98%   BMI 26.15 kg/m   Physical Exam  Constitutional: He is oriented to person, place, and time. He appears well-developed and well-nourished. No distress.  HENT:  Head: Normocephalic and atraumatic.  Right Ear: Hearing normal.  Left Ear: Hearing normal.  Nose: Nose normal.  Mouth/Throat: Oropharynx is clear and moist and mucous membranes are normal.  Eyes: Pupils are equal, round, and reactive to light. Conjunctivae and EOM are normal.  Neck: Normal range of motion. Neck supple.  Cardiovascular:  Regular rhythm, S1 normal and S2 normal. Exam reveals no gallop and no friction rub.  No murmur heard. Pulmonary/Chest: Effort normal and breath sounds normal. No respiratory distress. He exhibits no tenderness.  Abdominal: Soft. Normal appearance and bowel sounds are normal. There is no hepatosplenomegaly. There is no tenderness. There is no rebound, no guarding, no tenderness at McBurney's point and negative Murphy's sign. No hernia.  Musculoskeletal: Normal range of motion.  Neurological: He is alert and oriented to person, place, and time. He has normal strength. No cranial nerve deficit or sensory deficit. Coordination normal. GCS eye subscore is 4. GCS verbal subscore is 5. GCS motor subscore is 6.  Skin: Skin is warm, dry and intact. No rash noted. No cyanosis.  Psychiatric: He has a normal mood and affect. His speech  is normal and behavior is normal. Thought content normal.  Nursing note and vitals reviewed.    ED Treatments / Results  Labs (all labs ordered are listed, but only abnormal results are displayed) Labs Reviewed  APTT - Abnormal; Notable for the following components:      Result Value   aPTT 38 (*)    All other components within normal limits  COMPREHENSIVE METABOLIC PANEL - Abnormal; Notable for the following components:   Chloride 95 (*)    BUN 27 (*)    Creatinine, Ser 1.81 (*)    GFR calc non Af Amer 32 (*)    GFR calc Af Amer 37 (*)    All other components within normal limits  URINALYSIS, ROUTINE W REFLEX MICROSCOPIC - Abnormal; Notable for the following components:   Glucose, UA 50 (*)    Hgb urine dipstick SMALL (*)    Protein, ur 30 (*)    All other components within normal limits  PROTIME-INR  CBC  DIFFERENTIAL  I-STAT TROPONIN, ED  I-STAT TROPONIN, ED    EKG EKG Interpretation  Date/Time:  Friday November 11 2017 17:25:15 EDT Ventricular Rate:  72 PR Interval:  172 QRS Duration: 148 QT Interval:  450 QTC Calculation: 492 R  Axis:   -114 Text Interpretation:  Atrial-paced rhythm Right bundle branch block Inferior infarct , age undetermined Anterolateral infarct , age undetermined Abnormal ECG No significant change since last tracing Confirmed by Gilda Creaseollina, Christopher J [id number]) on 11/11/2017 11:38:14 PM   Radiology Ct Head Wo Contrast  Result Date: 11/11/2017 CLINICAL DATA:  Generalized weakness, memory loss and dizziness for the past 2 weeks. EXAM: CT HEAD WITHOUT CONTRAST TECHNIQUE: Contiguous axial images were obtained from the base of the skull through the vertex without intravenous contrast. COMPARISON:  Brain MR dated 04/11/2017 and head CT dated 02/08/2018. FINDINGS: Brain: Mild to moderate diffuse enlargement of the ventricles and subarachnoid spaces without significant change. Mild patchy white matter low density in both cerebral hemispheres with mild progression. No intracranial hemorrhage, mass lesion or CT evidence of acute infarction. Vascular: No hyperdense vessel or unexpected calcification. Skull: Normal. Negative for fracture or focal lesion. Sinuses/Orbits: Status post cataract extraction on the right. Minimal bilateral maxillary sinus mucosal thickening. Other: None. IMPRESSION: 1. No acute abnormality. 2. Stable mild to moderate diffuse cerebral and cerebellar atrophy. 3. Mildly progressive mild chronic small vessel white matter ischemic changes in both cerebral hemispheres. 4. Minimal chronic bilateral maxillary sinusitis. Electronically Signed   By: Beckie SaltsSteven  Reid M.D.   On: 11/11/2017 19:55    Procedures Procedures (including critical care time)  Medications Ordered in ED Medications - No data to display   Initial Impression / Assessment and Plan / ED Course  I have reviewed the triage vital signs and the nursing notes.  Pertinent labs & imaging results that were available during my care of the patient were reviewed by me and considered in my medical decision making (see chart for details).      Patient presents to the ER with multiple complaints that have been ongoing for several weeks.  All of the symptoms seem to come and go without a known trigger.  He reports that he wakes up in the morning with headaches sometimes on the right side of his head.  He does not currently have a headache.  He also has been experiencing intermittent numbness on the top of his head.  This is not unilateral, includes the right and left side only on the  vertex of his scalp.  This is also not currently present.  Family concerned about cognitive dysfunction.  He is awake, alert and oriented currently.  This is likely age-related.  He also complains of intermittent dizziness.  He does have a history of postural hypotension and syncope.  His work-up has been entirely reassuring and he has a normal exam at this time.  Patient was admitted in January of this year for TIA, had full TIA work-up.  He does not require repeat evaluation, this does not seem consistent with recurrent TIA or stroke.  Recommend follow-up with PCP and possibly neurology, does not require any further work-up tonight.  Final Clinical Impressions(s) / ED Diagnoses   Final diagnoses:  Dizziness  Numbness    ED Discharge Orders    None       Gilda Crease, MD 11/11/17 2352

## 2017-12-26 DIAGNOSIS — J209 Acute bronchitis, unspecified: Secondary | ICD-10-CM | POA: Diagnosis not present

## 2018-01-10 ENCOUNTER — Ambulatory Visit (INDEPENDENT_AMBULATORY_CARE_PROVIDER_SITE_OTHER): Payer: Medicare HMO | Admitting: *Deleted

## 2018-01-10 DIAGNOSIS — I495 Sick sinus syndrome: Secondary | ICD-10-CM | POA: Diagnosis not present

## 2018-01-11 NOTE — Progress Notes (Signed)
Remote pacemaker transmission.   

## 2018-01-16 ENCOUNTER — Ambulatory Visit: Payer: Medicare HMO | Admitting: Neurology

## 2018-01-18 ENCOUNTER — Encounter: Payer: Medicare HMO | Admitting: Internal Medicine

## 2018-01-26 ENCOUNTER — Ambulatory Visit (INDEPENDENT_AMBULATORY_CARE_PROVIDER_SITE_OTHER): Payer: Medicare HMO | Admitting: Internal Medicine

## 2018-01-26 ENCOUNTER — Encounter: Payer: Self-pay | Admitting: Internal Medicine

## 2018-01-26 VITALS — BP 120/62 | HR 66 | Ht 66.0 in | Wt 165.6 lb

## 2018-01-26 DIAGNOSIS — I495 Sick sinus syndrome: Secondary | ICD-10-CM

## 2018-01-26 DIAGNOSIS — Z950 Presence of cardiac pacemaker: Secondary | ICD-10-CM | POA: Diagnosis not present

## 2018-01-26 NOTE — Patient Instructions (Signed)
Medication Instructions:  Your physician recommends that you continue on your current medications as directed. Please refer to the Current Medication list given to you today.  If you need a refill on your cardiac medications before your next appointment, please call your pharmacy.   Lab work: None ordered.  If you have labs (blood work) drawn today and your tests are completely normal, you will receive your results only by: Marland Kitchen MyChart Message (if you have MyChart) OR . A paper copy in the mail If you have any lab test that is abnormal or we need to change your treatment, we will call you to review the results.  Testing/Procedures: None ordered.  Follow-Up:  Your physician wants you to follow-up in: one year with Dr. Ladona Ridgel.    You will receive a reminder letter in the mail two months in advance. If you don't receive a letter, please call our office to schedule the follow-up appointment.  Remote monitoring is used to monitor your Pacemaker from home. This monitoring reduces the number of office visits required to check your device to one time per year. It allows [place] to keep an eye on the functioning of your device to ensure it is working properly. You are scheduled for a device check from home on 04/11/2018. You may send your transmission at any time that day. If you have a wireless device, the transmission will be sent automatically. After your physician reviews your transmission, you will receive a postcard with your next transmission date.  At [facility], you and your health needs are our priority.  As part of our continuing mission to provide you with exceptional heart care, we have created designated Provider Care Teams.  These Care Teams include your primary Cardiologist (physician) and Advanced Practice Providers (APPs -  Physician Assistants and Nurse Practitioners) who all work together to provide you with the care you need, when you need it. Bonita Quin may see Lewayne Bunting, MD or one of  the following Advanced Practice Providers on your designated Care Team:   . Gypsy Balsam, NP . Francis Dowse, PA-C   Any Other Special Instructions Will Be Listed Below (If Applicable).

## 2018-01-26 NOTE — Progress Notes (Signed)
HPI Mr. Noah Adkins returns today for followup. He is a pleasant 84 yo man with a h/o CHB, s/p PPM insertion just over 2 years ago. In the interim he has done well with no chest pain, sob, or syncope. He has minimal lower extremity swelling. He has know CAD but denies anginal symptoms. Allergies  Allergen Reactions  . Atenolol Nausea Only and Other (See Comments)    Dizzy, headache  . Dilacor Xr [Diltiazem Hcl] Other (See Comments)    NOT EFFECTIVE  . Isoptin Sr [Verapamil Hcl Er] Other (See Comments)    Erectile dysfunction  . Lopressor [Metoprolol Tartrate] Nausea Only and Other (See Comments)    dizzy, headache  . Norvasc [Amlodipine Besylate] Other (See Comments)    Headache and erectile dysfunction     Current Outpatient Medications  Medication Sig Dispense Refill  . allopurinol (ZYLOPRIM) 300 MG tablet Take 300 mg by mouth daily.     Marland Kitchen aspirin EC 325 MG EC tablet Take 1 tablet (325 mg total) by mouth daily. 30 tablet 0  . diphenhydrAMINE (BENADRYL) 25 mg capsule Take 25 mg by mouth every 6 (six) hours as needed for itching, allergies or sleep.    . felodipine (PLENDIL) 5 MG 24 hr tablet Take 5 mg by mouth daily.     . fluticasone (FLONASE) 50 MCG/ACT nasal spray Place 1 spray into both nostrils daily.     Marland Kitchen glipiZIDE (GLUCOTROL XL) 2.5 MG 24 hr tablet Take 2.5 mg by mouth daily.    Marland Kitchen guaiFENesin-dextromethorphan (ROBITUSSIN DM) 100-10 MG/5ML syrup Take 5 mLs by mouth every 4 (four) hours as needed for cough.    Marland Kitchen ibuprofen (ADVIL,MOTRIN) 200 MG tablet Take 200-400 mg by mouth every 6 (six) hours as needed for fever, headache, mild pain, moderate pain or cramping.    Marland Kitchen lisinopril-hydrochlorothiazide (PRINZIDE,ZESTORETIC) 20-25 MG per tablet Take 1 tablet by mouth daily.     . naproxen sodium (ALEVE) 220 MG tablet Take 220 mg by mouth 2 (two) times daily as needed (pain).    Marland Kitchen omeprazole (PRILOSEC) 40 MG capsule Take 40 mg by mouth daily.    Marland Kitchen oxybutynin (DITROPAN) 5 MG tablet  Take 5 mg by mouth 2 (two) times daily.  1  . oxyCODONE-acetaminophen (PERCOCET/ROXICET) 5-325 MG tablet Take 1 tablet by mouth every 6 (six) hours as needed (pain).     . simvastatin (ZOCOR) 10 MG tablet Take 10 mg by mouth at bedtime.     . solifenacin (VESICARE) 10 MG tablet Take 10 mg by mouth at bedtime.    Marland Kitchen tetrahydrozoline 0.05 % ophthalmic solution Place 1 drop into both eyes as needed (dry eyes).    . traMADol (ULTRAM) 50 MG tablet Take 50 mg by mouth 2 (two) times daily as needed (pain).    . traZODone (DESYREL) 150 MG tablet Take 150 mg by mouth at bedtime.     No current facility-administered medications for this visit.      Past Medical History:  Diagnosis Date  . Arthritis    "knees, hands" (10/10/2015)  . History of blood transfusion 1970s   "related to work related injury; broke rib; lost blood"  . History of gout   . Hypertension   . Kidney stones   . Presence of permanent cardiac pacemaker   . Type II diabetes mellitus (HCC)     ROS:   All systems reviewed and negative except as noted in the HPI.   Past Surgical History:  Procedure Laterality Date  . CATARACT EXTRACTION Right 2015  . CYSTOSCOPY W/ STONE MANIPULATION  1970s  . EP IMPLANTABLE DEVICE N/A 10/10/2015   Procedure: Pacemaker Implant;  Surgeon: Marinus Maw, MD;  Location: [facility] INVASIVE CV LAB;  Service: Cardiovascular;  Laterality: N/A;  . INSERT / REPLACE / REMOVE PACEMAKER  10/10/2015     Family History  Problem Relation Age of Onset  . Hypertension Father   . Diabetes Mother      Social History   Socioeconomic History  . Marital status: Married    Spouse name: Not on file  . Number of children: Not on file  . Years of education: Not on file  . Highest education level: Not on file  Occupational History  . Not on file  Social Needs  . Financial resource strain: Not on file  . Food insecurity:    Worry: Not on file    Inability: Not on file  . Transportation needs:    Medical:  Not on file    Non-medical: Not on file  Tobacco Use  . Smoking status: Former Smoker    Years: 30.00    Types: Pipe, Cigars  . Smokeless tobacco: Former Neurosurgeon    Types: Chew  . Tobacco comment: 10/10/2015 "quit smoking cigarettes & chewing tobacco in the 1980s"  Substance and Sexual Activity  . Alcohol use: No    Alcohol/week: 0.0 standard drinks    Frequency: Never    Comment: 10/10/2015 "last year I had 2 beers"  . Drug use: No  . Sexual activity: Not Currently  Lifestyle  . Physical activity:    Days per week: Not on file    Minutes per session: Not on file  . Stress: Not on file  Relationships  . Social connections:    Talks on phone: Not on file    Gets together: Not on file    Attends religious service: Not on file    Active member of club or organization: Not on file    Attends meetings of clubs or organizations: Not on file    Relationship status: Not on file  . Intimate partner violence:    Fear of current or ex partner: Not on file    Emotionally abused: Not on file    Physically abused: Not on file    Forced sexual activity: Not on file  Other Topics Concern  . Not on file  Social History Narrative  . Not on file     BP 120/62   Pulse 66   Ht 5\' 6"  (1.676 m)   Wt 165 lb 9.6 oz (75.1 kg)   SpO2 93%   BMI 26.73 kg/m   Physical Exam:  Well appearing elderly man, NAD HEENT: Unremarkable Neck:  7 cm JVD, no thyromegally Lymphatics:  No adenopathy Back:  No CVA tenderness Lungs:  Clear with no wheezes HEART:  Regular rate rhythm, no murmurs, no rubs, no clicks Abd:  soft, positive bowel sounds, no organomegally, no rebound, no guarding Ext:  2 plus pulses, no edema, no cyanosis, no clubbing Skin:  No rashes no nodules Neuro:  CN II through XII intact, motor grossly intact  DEVICE  Normal device function.  See PaceArt for details.   Assess/Plan: 1. CHB - he is asymptomatic, s/p PPM insertion. 2. PPM - his Biotronik DDD PM is working normally with  about 8 years of battery longevity. 3. CAD - he denies anginal symptoms. He is encouraged to increase his activity.  Noah Adkins.D.

## 2018-01-27 LAB — CUP PACEART INCLINIC DEVICE CHECK
Date Time Interrogation Session: 20191101170204
Implantable Lead Implant Date: 20170714
Implantable Lead Implant Date: 20170714
Implantable Lead Location: 753859
Implantable Lead Location: 753860
Implantable Lead Model: 377
Implantable Lead Model: 377
Implantable Lead Serial Number: 49489173
Implantable Lead Serial Number: 49549002
Implantable Pulse Generator Implant Date: 20170714
Lead Channel Setting Pacing Amplitude: 2 V
Lead Channel Setting Pacing Amplitude: 2.4 V
Lead Channel Setting Pacing Pulse Width: 0.4 ms
Pulse Gen Model: 394969
Pulse Gen Serial Number: 68764743

## 2018-03-13 DIAGNOSIS — M109 Gout, unspecified: Secondary | ICD-10-CM | POA: Diagnosis not present

## 2018-03-13 DIAGNOSIS — Z23 Encounter for immunization: Secondary | ICD-10-CM | POA: Diagnosis not present

## 2018-03-13 DIAGNOSIS — M170 Bilateral primary osteoarthritis of knee: Secondary | ICD-10-CM | POA: Diagnosis not present

## 2018-03-13 DIAGNOSIS — N183 Chronic kidney disease, stage 3 (moderate): Secondary | ICD-10-CM | POA: Diagnosis not present

## 2018-03-13 DIAGNOSIS — N3281 Overactive bladder: Secondary | ICD-10-CM | POA: Diagnosis not present

## 2018-03-13 DIAGNOSIS — E1122 Type 2 diabetes mellitus with diabetic chronic kidney disease: Secondary | ICD-10-CM | POA: Diagnosis not present

## 2018-03-13 DIAGNOSIS — I10 Essential (primary) hypertension: Secondary | ICD-10-CM | POA: Diagnosis not present

## 2018-03-13 DIAGNOSIS — E7800 Pure hypercholesterolemia, unspecified: Secondary | ICD-10-CM | POA: Diagnosis not present

## 2018-03-13 DIAGNOSIS — G894 Chronic pain syndrome: Secondary | ICD-10-CM | POA: Diagnosis not present

## 2018-03-18 LAB — CUP PACEART REMOTE DEVICE CHECK
Date Time Interrogation Session: 20191221202315
Implantable Lead Implant Date: 20170714
Implantable Lead Implant Date: 20170714
Implantable Lead Location: 753859
Implantable Lead Location: 753860
Implantable Lead Model: 377
Implantable Lead Model: 377
Implantable Lead Serial Number: 49489173
Implantable Lead Serial Number: 49549002
Implantable Pulse Generator Implant Date: 20170714
Pulse Gen Model: 394969
Pulse Gen Serial Number: 68764743

## 2018-04-11 ENCOUNTER — Ambulatory Visit (INDEPENDENT_AMBULATORY_CARE_PROVIDER_SITE_OTHER): Payer: Medicare HMO

## 2018-04-11 DIAGNOSIS — I495 Sick sinus syndrome: Secondary | ICD-10-CM

## 2018-04-12 NOTE — Progress Notes (Signed)
Remote pacemaker transmission.   

## 2018-04-14 LAB — CUP PACEART REMOTE DEVICE CHECK
Date Time Interrogation Session: 20200117203023
Implantable Lead Implant Date: 20170714
Implantable Lead Implant Date: 20170714
Implantable Lead Location: 753859
Implantable Lead Location: 753860
Implantable Lead Model: 377
Implantable Lead Model: 377
Implantable Lead Serial Number: 49489173
Implantable Lead Serial Number: 49549002
Implantable Pulse Generator Implant Date: 20170714
Pulse Gen Model: 394969
Pulse Gen Serial Number: 68764743

## 2018-05-02 DIAGNOSIS — R454 Irritability and anger: Secondary | ICD-10-CM | POA: Diagnosis not present

## 2018-05-02 DIAGNOSIS — I10 Essential (primary) hypertension: Secondary | ICD-10-CM | POA: Diagnosis not present

## 2018-05-02 DIAGNOSIS — R413 Other amnesia: Secondary | ICD-10-CM | POA: Diagnosis not present

## 2018-07-11 ENCOUNTER — Other Ambulatory Visit: Payer: Self-pay

## 2018-07-11 ENCOUNTER — Ambulatory Visit (INDEPENDENT_AMBULATORY_CARE_PROVIDER_SITE_OTHER): Payer: Medicare HMO | Admitting: *Deleted

## 2018-07-11 DIAGNOSIS — I495 Sick sinus syndrome: Secondary | ICD-10-CM

## 2018-07-11 LAB — CUP PACEART REMOTE DEVICE CHECK
Date Time Interrogation Session: 20200414205604
Implantable Lead Implant Date: 20170714
Implantable Lead Implant Date: 20170714
Implantable Lead Location: 753859
Implantable Lead Location: 753860
Implantable Lead Model: 377
Implantable Lead Model: 377
Implantable Lead Serial Number: 49489173
Implantable Lead Serial Number: 49549002
Implantable Pulse Generator Implant Date: 20170714
Pulse Gen Model: 394969
Pulse Gen Serial Number: 68764743

## 2018-07-18 ENCOUNTER — Encounter: Payer: Self-pay | Admitting: Cardiology

## 2018-07-18 NOTE — Progress Notes (Signed)
Remote pacemaker transmission.   

## 2018-09-22 DIAGNOSIS — M170 Bilateral primary osteoarthritis of knee: Secondary | ICD-10-CM | POA: Diagnosis not present

## 2018-09-22 DIAGNOSIS — E1122 Type 2 diabetes mellitus with diabetic chronic kidney disease: Secondary | ICD-10-CM | POA: Diagnosis not present

## 2018-09-22 DIAGNOSIS — N183 Chronic kidney disease, stage 3 (moderate): Secondary | ICD-10-CM | POA: Diagnosis not present

## 2018-09-22 DIAGNOSIS — I700 Atherosclerosis of aorta: Secondary | ICD-10-CM | POA: Diagnosis not present

## 2018-09-22 DIAGNOSIS — E7800 Pure hypercholesterolemia, unspecified: Secondary | ICD-10-CM | POA: Diagnosis not present

## 2018-09-22 DIAGNOSIS — Z1389 Encounter for screening for other disorder: Secondary | ICD-10-CM | POA: Diagnosis not present

## 2018-09-22 DIAGNOSIS — G4700 Insomnia, unspecified: Secondary | ICD-10-CM | POA: Diagnosis not present

## 2018-09-22 DIAGNOSIS — E113299 Type 2 diabetes mellitus with mild nonproliferative diabetic retinopathy without macular edema, unspecified eye: Secondary | ICD-10-CM | POA: Diagnosis not present

## 2018-09-22 DIAGNOSIS — I10 Essential (primary) hypertension: Secondary | ICD-10-CM | POA: Diagnosis not present

## 2018-09-22 DIAGNOSIS — Z0000 Encounter for general adult medical examination without abnormal findings: Secondary | ICD-10-CM | POA: Diagnosis not present

## 2018-10-10 ENCOUNTER — Ambulatory Visit (INDEPENDENT_AMBULATORY_CARE_PROVIDER_SITE_OTHER): Payer: Medicare HMO | Admitting: *Deleted

## 2018-10-10 DIAGNOSIS — R55 Syncope and collapse: Secondary | ICD-10-CM

## 2018-10-10 DIAGNOSIS — G459 Transient cerebral ischemic attack, unspecified: Secondary | ICD-10-CM

## 2018-10-11 LAB — CUP PACEART REMOTE DEVICE CHECK
Date Time Interrogation Session: 20200715182727
Implantable Lead Implant Date: 20170714
Implantable Lead Implant Date: 20170714
Implantable Lead Location: 753859
Implantable Lead Location: 753860
Implantable Lead Model: 377
Implantable Lead Model: 377
Implantable Lead Serial Number: 49489173
Implantable Lead Serial Number: 49549002
Implantable Pulse Generator Implant Date: 20170714
Pulse Gen Model: 394969
Pulse Gen Serial Number: 68764743

## 2018-10-22 IMAGING — US US ABDOMEN COMPLETE
1 series · 14 of 25 positions shown · non-contrast
Comparison: None.

CLINICAL DATA: Nausea and vomiting.

EXAM:
ABDOMEN ULTRASOUND COMPLETE

[Series 1: us abdomen complete · 0.18mm/px · 14 of 114 slices shown]
[im 1/114]
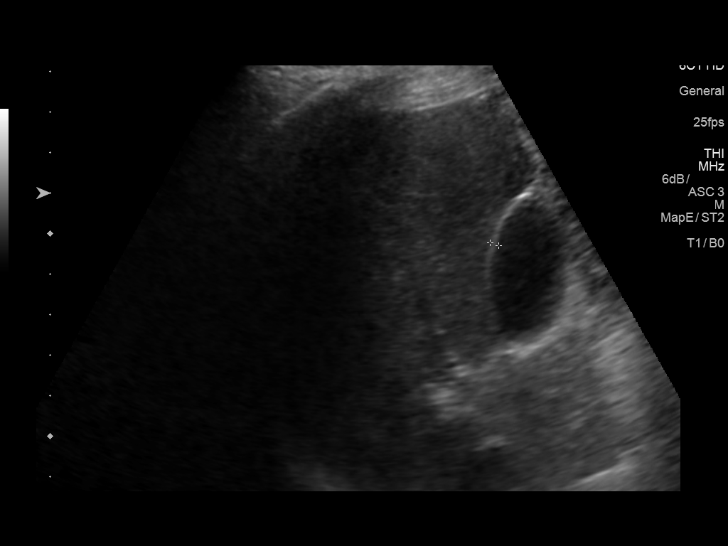
[im 10/114]
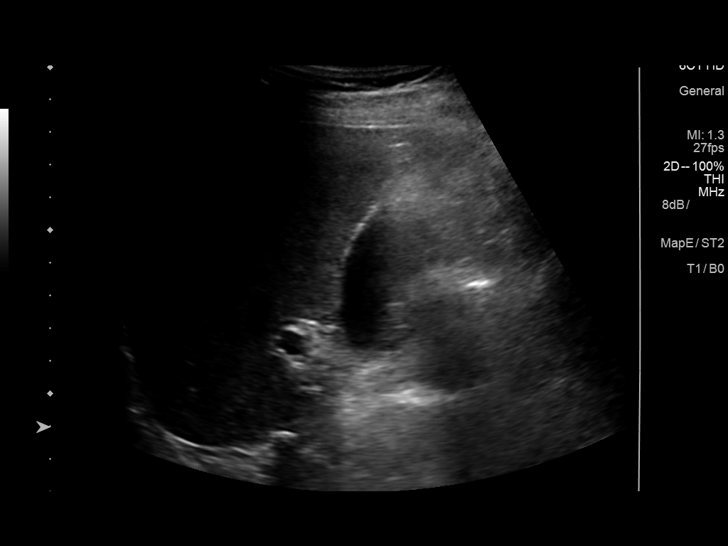
[im 19/114]
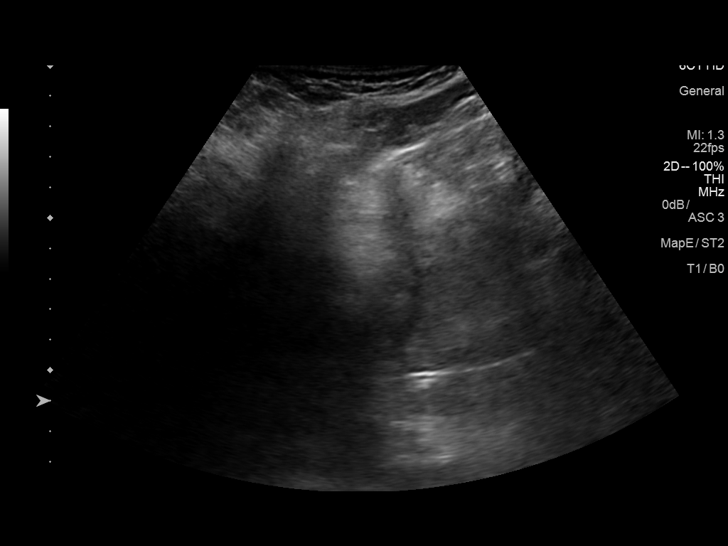
[im 29/114]
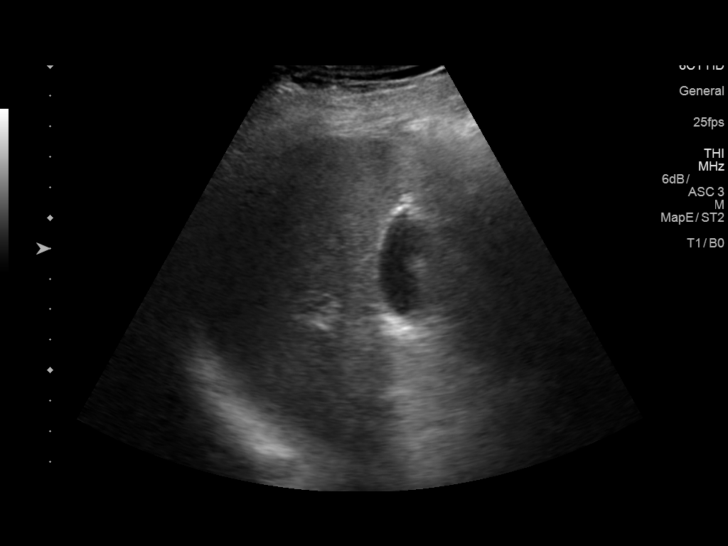
[im 38/114]
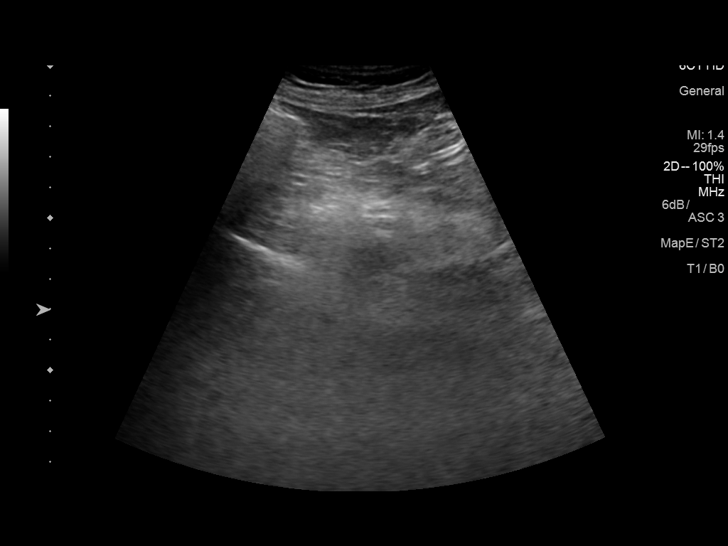
[im 43/114]
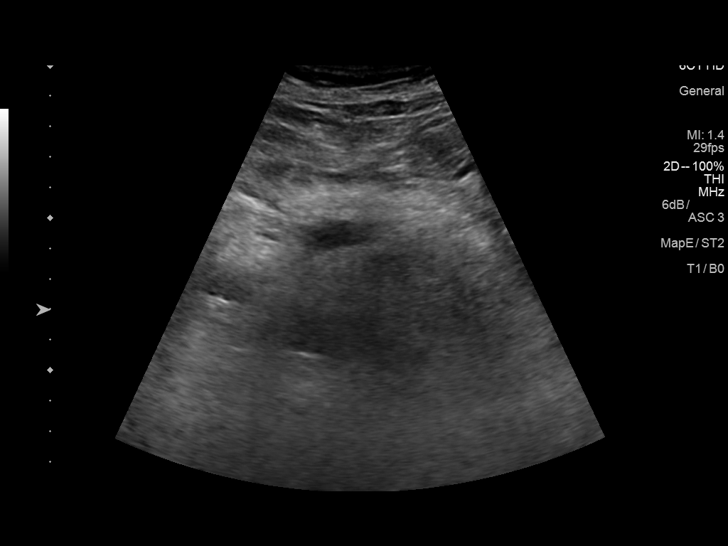
[im 52/114]
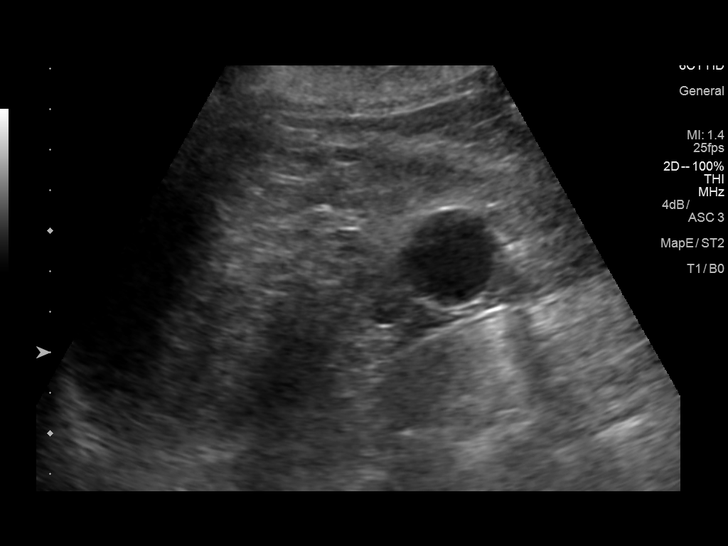
[im 62/114]
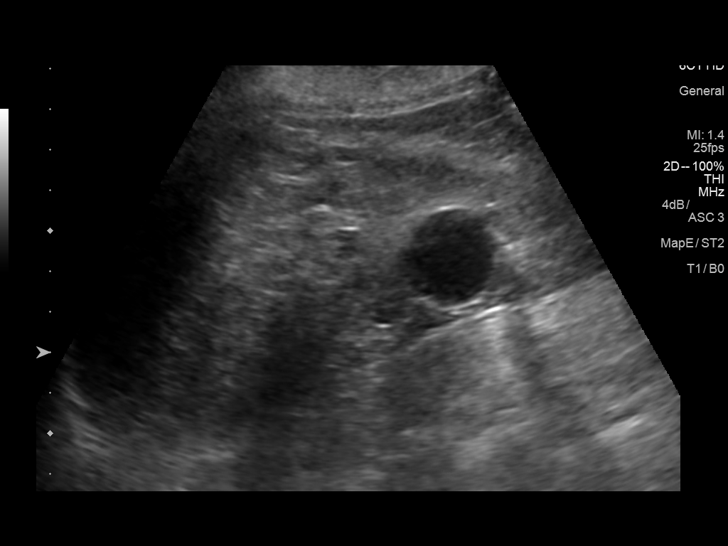
[im 71/114]
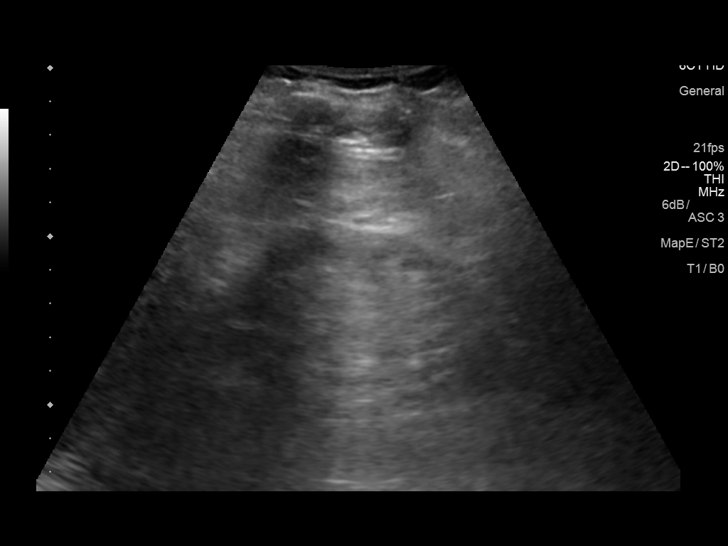
[im 76/114]
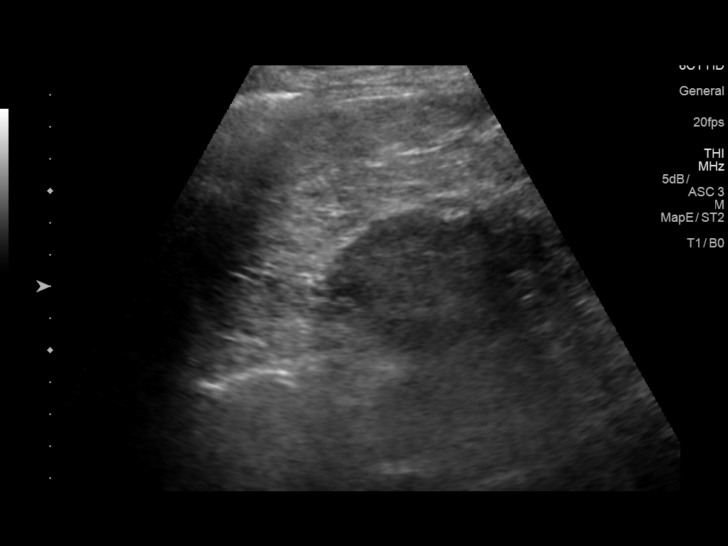
[im 85/114]
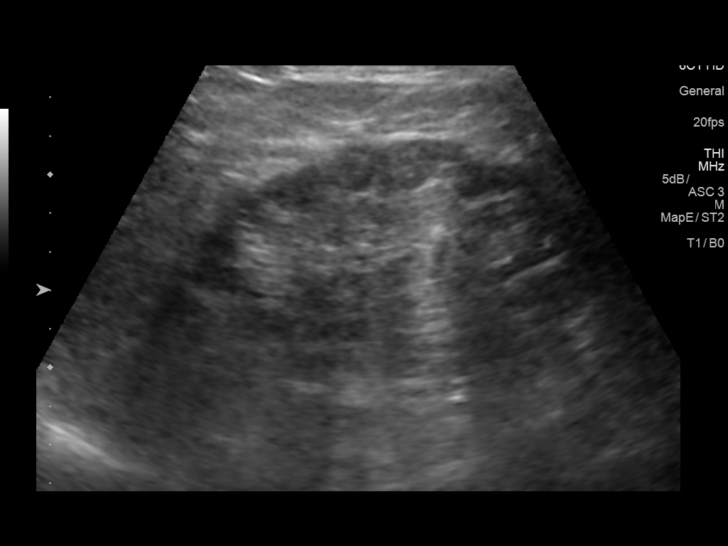
[im 95/114]
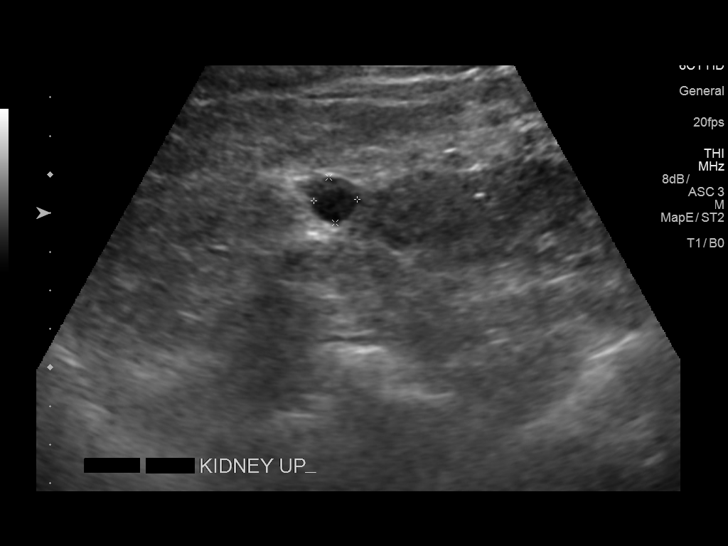
[im 104/114]
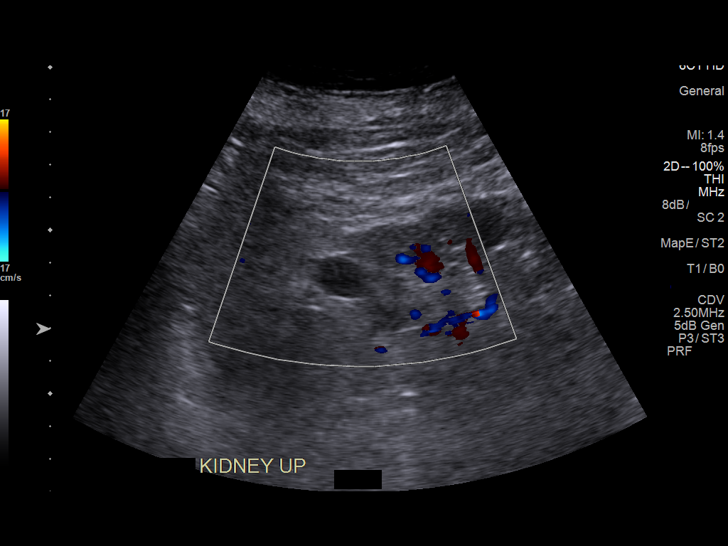
[im 114/114]
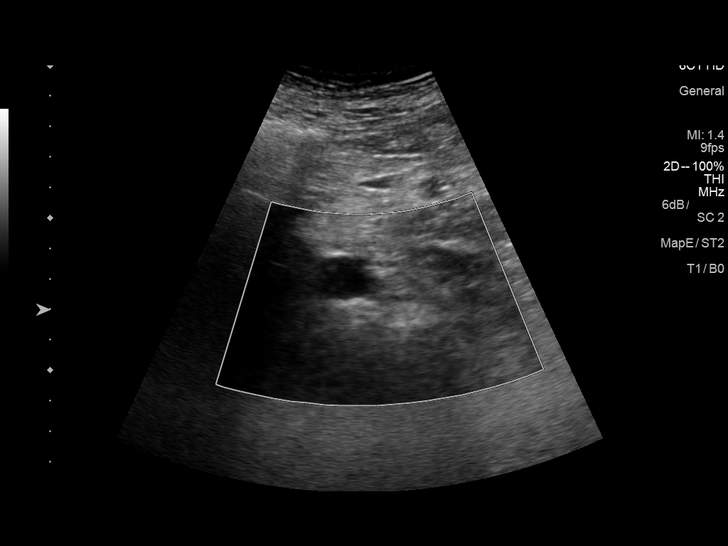

[14 of 25 positions shown; findings below may reference images not displayed]

FINDINGS: Gallbladder: No gallstones or wall thickening visualized. No
sonographic Murphy sign noted by sonographer.

Common bile duct: Diameter: 5 mm

Liver: Increased in echogenicity. No focal lesion identified. Portal
vein is patent on color Doppler imaging with normal direction of
blood flow towards the liver.

IVC: No abnormality visualized.

Pancreas: Visualized portion unremarkable.

Spleen: Size and appearance within normal limits.

Right Kidney: Length: 10.6 cm. Renal cortical thinning. No
hydronephrosis. There is a 2.3 x 2.4 cm mildly complicated cyst.

Left Kidney: Length: 10.8 cm. Renal cortical thinning. No
hydronephrosis. Mildly complicated cyst off the left kidney
measuring up to 1.5 cm within the lower pole.

Abdominal aorta: No aneurysm visualized.

Other findings: None.
IMPRESSION: No acute process.

No cholelithiasis or sonographic evidence for acute cholecystitis.

Atrophic kidneys bilaterally.

## 2018-10-25 NOTE — Progress Notes (Signed)
Remote pacemaker transmission.   

## 2018-10-26 DIAGNOSIS — E1122 Type 2 diabetes mellitus with diabetic chronic kidney disease: Secondary | ICD-10-CM | POA: Diagnosis not present

## 2018-10-26 DIAGNOSIS — Z7984 Long term (current) use of oral hypoglycemic drugs: Secondary | ICD-10-CM | POA: Diagnosis not present

## 2018-10-26 DIAGNOSIS — I10 Essential (primary) hypertension: Secondary | ICD-10-CM | POA: Diagnosis not present

## 2018-10-26 DIAGNOSIS — M109 Gout, unspecified: Secondary | ICD-10-CM | POA: Diagnosis not present

## 2018-10-26 DIAGNOSIS — E7800 Pure hypercholesterolemia, unspecified: Secondary | ICD-10-CM | POA: Diagnosis not present

## 2019-01-10 ENCOUNTER — Ambulatory Visit (INDEPENDENT_AMBULATORY_CARE_PROVIDER_SITE_OTHER): Payer: Medicare HMO | Admitting: *Deleted

## 2019-01-10 DIAGNOSIS — R55 Syncope and collapse: Secondary | ICD-10-CM

## 2019-01-10 DIAGNOSIS — G459 Transient cerebral ischemic attack, unspecified: Secondary | ICD-10-CM

## 2019-01-10 LAB — CUP PACEART REMOTE DEVICE CHECK
Date Time Interrogation Session: 20201014223632
Implantable Lead Implant Date: 20170714
Implantable Lead Implant Date: 20170714
Implantable Lead Location: 753859
Implantable Lead Location: 753860
Implantable Lead Model: 377
Implantable Lead Model: 377
Implantable Lead Serial Number: 49489173
Implantable Lead Serial Number: 49549002
Implantable Pulse Generator Implant Date: 20170714
Pulse Gen Model: 394969
Pulse Gen Serial Number: 68764743

## 2019-01-23 NOTE — Progress Notes (Signed)
Remote pacemaker transmission.   

## 2019-04-04 DIAGNOSIS — M109 Gout, unspecified: Secondary | ICD-10-CM | POA: Diagnosis not present

## 2019-04-04 DIAGNOSIS — G894 Chronic pain syndrome: Secondary | ICD-10-CM | POA: Diagnosis not present

## 2019-04-04 DIAGNOSIS — M170 Bilateral primary osteoarthritis of knee: Secondary | ICD-10-CM | POA: Diagnosis not present

## 2019-04-04 DIAGNOSIS — I10 Essential (primary) hypertension: Secondary | ICD-10-CM | POA: Diagnosis not present

## 2019-04-04 DIAGNOSIS — E113293 Type 2 diabetes mellitus with mild nonproliferative diabetic retinopathy without macular edema, bilateral: Secondary | ICD-10-CM | POA: Diagnosis not present

## 2019-04-04 DIAGNOSIS — E7800 Pure hypercholesterolemia, unspecified: Secondary | ICD-10-CM | POA: Diagnosis not present

## 2019-04-04 DIAGNOSIS — E1122 Type 2 diabetes mellitus with diabetic chronic kidney disease: Secondary | ICD-10-CM | POA: Diagnosis not present

## 2019-04-04 DIAGNOSIS — R454 Irritability and anger: Secondary | ICD-10-CM | POA: Diagnosis not present

## 2019-04-04 DIAGNOSIS — E113299 Type 2 diabetes mellitus with mild nonproliferative diabetic retinopathy without macular edema, unspecified eye: Secondary | ICD-10-CM | POA: Diagnosis not present

## 2019-04-04 DIAGNOSIS — N1832 Chronic kidney disease, stage 3b: Secondary | ICD-10-CM | POA: Diagnosis not present

## 2019-04-04 DIAGNOSIS — G4700 Insomnia, unspecified: Secondary | ICD-10-CM | POA: Diagnosis not present

## 2019-04-04 DIAGNOSIS — R413 Other amnesia: Secondary | ICD-10-CM | POA: Diagnosis not present

## 2019-04-04 DIAGNOSIS — Z23 Encounter for immunization: Secondary | ICD-10-CM | POA: Diagnosis not present

## 2019-04-09 ENCOUNTER — Ambulatory Visit: Payer: Medicare HMO | Attending: Internal Medicine

## 2019-04-09 DIAGNOSIS — Z20822 Contact with and (suspected) exposure to covid-19: Secondary | ICD-10-CM | POA: Diagnosis not present

## 2019-04-11 ENCOUNTER — Telehealth: Payer: Self-pay | Admitting: Family Medicine

## 2019-04-11 ENCOUNTER — Ambulatory Visit (INDEPENDENT_AMBULATORY_CARE_PROVIDER_SITE_OTHER): Payer: Medicare HMO | Admitting: *Deleted

## 2019-04-11 DIAGNOSIS — G459 Transient cerebral ischemic attack, unspecified: Secondary | ICD-10-CM

## 2019-04-11 LAB — CUP PACEART REMOTE DEVICE CHECK
Date Time Interrogation Session: 20210113174217
Implantable Lead Implant Date: 20170714
Implantable Lead Implant Date: 20170714
Implantable Lead Location: 753859
Implantable Lead Location: 753860
Implantable Lead Model: 377
Implantable Lead Model: 377
Implantable Lead Serial Number: 49489173
Implantable Lead Serial Number: 49549002
Implantable Pulse Generator Implant Date: 20170714
Pulse Gen Model: 394969
Pulse Gen Serial Number: 68764743

## 2019-04-11 LAB — NOVEL CORONAVIRUS, NAA: SARS-CoV-2, NAA: NOT DETECTED

## 2019-04-11 NOTE — Telephone Encounter (Signed)
Patient wife called in and received his negative covid test result  

## 2019-05-10 ENCOUNTER — Encounter: Payer: Medicare HMO | Admitting: Internal Medicine

## 2019-05-14 NOTE — Progress Notes (Deleted)
Electrophysiology Office Note Date: 05/14/2019  ID:  Noah Adkins, DOB March 05, 1927, MRN [id number]  PCP: Donald Prose, MD Electrophysiologist: Dr. Lovena Le   CC: Pacemaker follow-up  Noah Adkins is a 86 y.o. male seen today for Dr. Lovena Le . he presents today for routine electrophysiology followup.  Since last being seen in our clinic, the patient reports doing very well.  he denies chest pain, palpitations, dyspnea, PND, orthopnea, nausea, vomiting, dizziness, syncope, edema, weight gain, or early satiety.  Device History: Biotronik Dual Chamber PPM implanted 09/2015 for [facility]B  Past Medical History:  Diagnosis Date  . Arthritis    "knees, hands" (10/10/2015)  . History of blood transfusion 1970s   "related to work related injury; broke rib; lost blood"  . History of gout   . Hypertension   . Kidney stones   . Presence of permanent cardiac pacemaker   . Type II diabetes mellitus ([place])    Past Surgical History:  Procedure Laterality Date  . CATARACT EXTRACTION Right 2015  . CYSTOSCOPY W/ STONE MANIPULATION  1970s  . EP IMPLANTABLE DEVICE N/A 10/10/2015   Procedure: Pacemaker Implant;  Surgeon: Evans Lance, MD;  Location: [facility];  Service: Cardiovascular;  Laterality: N/A;  . INSERT / REPLACE / REMOVE PACEMAKER  10/10/2015    Current Outpatient Medications  Medication Sig Dispense Refill  . allopurinol (ZYLOPRIM) 300 MG tablet Take 300 mg by mouth daily.     Marland Kitchen aspirin EC 325 MG EC tablet Take 1 tablet (325 mg total) by mouth daily. 30 tablet 0  . diphenhydrAMINE (BENADRYL) 25 mg capsule Take 25 mg by mouth every 6 (six) hours as needed for itching, allergies or sleep.    . felodipine (PLENDIL) 5 MG 24 hr tablet Take 5 mg by mouth daily.     . fluticasone (FLONASE) 50 MCG/ACT nasal spray Place 1 spray into both nostrils daily.     Marland Kitchen glipiZIDE (GLUCOTROL XL) 2.5 MG 24 hr tablet Take 2.5 mg by mouth daily.    Marland Kitchen guaiFENesin-dextromethorphan (ROBITUSSIN DM) 100-10  MG/5ML syrup Take 5 mLs by mouth every 4 (four) hours as needed for cough.    Marland Kitchen ibuprofen (ADVIL,MOTRIN) 200 MG tablet Take 200-400 mg by mouth every 6 (six) hours as needed for fever, headache, mild pain, moderate pain or cramping.    Marland Kitchen lisinopril-hydrochlorothiazide (PRINZIDE,ZESTORETIC) 20-25 MG per tablet Take 1 tablet by mouth daily.     . naproxen sodium (ALEVE) 220 MG tablet Take 220 mg by mouth 2 (two) times daily as needed (pain).    Marland Kitchen omeprazole (PRILOSEC) 40 MG capsule Take 40 mg by mouth daily.    Marland Kitchen oxybutynin (DITROPAN) 5 MG tablet Take 5 mg by mouth 2 (two) times daily.  1  . oxyCODONE-acetaminophen (PERCOCET/ROXICET) 5-325 MG tablet Take 1 tablet by mouth every 6 (six) hours as needed (pain).     . simvastatin (ZOCOR) 10 MG tablet Take 10 mg by mouth at bedtime.     . solifenacin (VESICARE) 10 MG tablet Take 10 mg by mouth at bedtime.    Marland Kitchen tetrahydrozoline 0.05 % ophthalmic solution Place 1 drop into both eyes as needed (dry eyes).    . traMADol (ULTRAM) 50 MG tablet Take 50 mg by mouth 2 (two) times daily as needed (pain).    . traZODone (DESYREL) 150 MG tablet Take 150 mg by mouth at bedtime.     No current facility-administered medications for this visit.    Allergies:  Atenolol, Dilacor xr [diltiazem hcl], Isoptin sr [verapamil hcl er], Lopressor [metoprolol tartrate], and Norvasc [amlodipine besylate]   Social History: Social History   Socioeconomic History  . Marital status: Married    Spouse name: Not on file  . Number of children: Not on file  . Years of education: Not on file  . Highest education level: Not on file  Occupational History  . Not on file  Tobacco Use  . Smoking status: Former Smoker    Years: 30.00    Types: Pipe, Cigars  . Smokeless tobacco: Former Neurosurgeon    Types: Chew  . Tobacco comment: 10/10/2015 "quit smoking cigarettes & chewing tobacco in the 1980s"  Substance and Sexual Activity  . Alcohol use: No    Alcohol/week: 0.0 standard drinks     Comment: 10/10/2015 "last year I had 2 beers"  . Drug use: No  . Sexual activity: Not Currently  Other Topics Concern  . Not on file  Social History Narrative  . Not on file   Social Determinants of Health   Financial Resource Strain:   . Difficulty of Paying Living Expenses: Not on file  Food Insecurity:   . Worried About Programme researcher, broadcasting/film/video in the Last Year: Not on file  . Ran Out of Food in the Last Year: Not on file  Transportation Needs:   . Lack of Transportation (Medical): Not on file  . Lack of Transportation (Non-Medical): Not on file  Physical Activity:   . Days of Exercise per Week: Not on file  . Minutes of Exercise per Session: Not on file  Stress:   . Feeling of Stress : Not on file  Social Connections:   . Frequency of Communication with Friends and Family: Not on file  . Frequency of Social Gatherings with Friends and Family: Not on file  . Attends Religious Services: Not on file  . Active Member of Clubs or Organizations: Not on file  . Attends Banker Meetings: Not on file  . Marital Status: Not on file  Intimate Partner Violence:   . Fear of Current or Ex-Partner: Not on file  . Emotionally Abused: Not on file  . Physically Abused: Not on file  . Sexually Abused: Not on file    Family History: Family History  Problem Relation Age of Onset  . Hypertension Father   . Diabetes Mother      Review of Systems: All other systems reviewed and are otherwise negative except as noted above.  Physical Exam: There were no vitals filed for this visit.   GEN- The patient is well appearing, alert and oriented x 3 today.   HEENT: normocephalic, atraumatic; sclera clear, conjunctiva pink; hearing intact; oropharynx clear; neck supple  Lungs- Clear to ausculation bilaterally, normal work of breathing.  No wheezes, rales, rhonchi Heart- Regular rate and rhythm, no murmurs, rubs or gallops  GI- soft, non-tender, non-distended, bowel sounds present   Extremities- no clubbing, cyanosis, or edema  MS- no significant deformity or atrophy Skin- warm and dry, no rash or lesion; PPM pocket well healed Psych- euthymic mood, full affect Neuro- strength and sensation are intact  PPM Interrogation- reviewed in detail today,  See PACEART report  EKG:  EKG is ordered today. The ekg ordered today shows ***  Recent Labs: No results found for requested labs within last 8760 hours.   Wt Readings from Last 3 Encounters:  01/26/18 165 lb 9.6 oz (75.1 kg)  11/11/17 162 lb 0.6 oz (  73.5 kg)  04/10/17 162 lb (73.5 kg)     Other studies Reviewed: Additional studies/ records that were reviewed today include: Previous EP office notes, Previous remote checks, Most recent labwork.   Assessment and Plan:  1.  CHB s/p Biotronik PPM  Normal PPM function See Pace Art report No changes today  2. CAD Denies anginal symptoms Encouraged increased activity as tolerated   Current medicines are reviewed at length with the patient today.   The patient {ACTIONS; HAS/DOES NOT HAVE:19233} concerns regarding his medicines.  The following changes were made today:  {NONE DEFAULTED:18576::"none"}  Labs/ tests ordered today include: *** No orders of the defined types were placed in this encounter.    Disposition:   Follow up with Dr. Ladona Ridgel in 12 Months    Signed, Luane School  05/14/2019 2:32 PM  [facility] [address] [place] [address] [phone] (office) [phone] (fax)

## 2019-05-15 ENCOUNTER — Encounter: Payer: Medicare HMO | Admitting: Student

## 2019-05-29 DIAGNOSIS — R197 Diarrhea, unspecified: Secondary | ICD-10-CM | POA: Diagnosis not present

## 2019-05-29 DIAGNOSIS — R413 Other amnesia: Secondary | ICD-10-CM | POA: Diagnosis not present

## 2019-06-08 DIAGNOSIS — R197 Diarrhea, unspecified: Secondary | ICD-10-CM | POA: Diagnosis not present

## 2019-07-11 ENCOUNTER — Ambulatory Visit (INDEPENDENT_AMBULATORY_CARE_PROVIDER_SITE_OTHER): Payer: Medicare HMO | Admitting: *Deleted

## 2019-07-11 DIAGNOSIS — Z45018 Encounter for adjustment and management of other part of cardiac pacemaker: Secondary | ICD-10-CM | POA: Diagnosis not present

## 2019-07-11 DIAGNOSIS — G459 Transient cerebral ischemic attack, unspecified: Secondary | ICD-10-CM

## 2019-07-11 LAB — CUP PACEART REMOTE DEVICE CHECK
Date Time Interrogation Session: 20210414063451
Implantable Lead Implant Date: 20170714
Implantable Lead Implant Date: 20170714
Implantable Lead Location: 753859
Implantable Lead Location: 753860
Implantable Lead Model: 377
Implantable Lead Model: 377
Implantable Lead Serial Number: 49489173
Implantable Lead Serial Number: 49549002
Implantable Pulse Generator Implant Date: 20170714
Pulse Gen Model: 394969
Pulse Gen Serial Number: 68764743

## 2019-07-11 NOTE — Progress Notes (Signed)
PPM Remote  

## 2019-08-13 DIAGNOSIS — R197 Diarrhea, unspecified: Secondary | ICD-10-CM | POA: Diagnosis not present

## 2019-10-09 LAB — CUP PACEART REMOTE DEVICE CHECK
Date Time Interrogation Session: 20210713094640
Implantable Lead Implant Date: 20170714
Implantable Lead Implant Date: 20170714
Implantable Lead Location: 753859
Implantable Lead Location: 753860
Implantable Lead Model: 377
Implantable Lead Model: 377
Implantable Lead Serial Number: 49489173
Implantable Lead Serial Number: 49549002
Implantable Pulse Generator Implant Date: 20170714
Pulse Gen Model: 394969
Pulse Gen Serial Number: 68764743

## 2019-10-10 ENCOUNTER — Ambulatory Visit (INDEPENDENT_AMBULATORY_CARE_PROVIDER_SITE_OTHER): Payer: Medicare HMO | Admitting: *Deleted

## 2019-10-10 DIAGNOSIS — I495 Sick sinus syndrome: Secondary | ICD-10-CM

## 2019-10-11 NOTE — Progress Notes (Signed)
Remote pacemaker transmission.   

## 2019-11-02 ENCOUNTER — Encounter: Payer: Medicare HMO | Admitting: Internal Medicine

## 2020-01-09 ENCOUNTER — Ambulatory Visit (INDEPENDENT_AMBULATORY_CARE_PROVIDER_SITE_OTHER): Payer: Medicare HMO

## 2020-01-09 DIAGNOSIS — Z950 Presence of cardiac pacemaker: Secondary | ICD-10-CM

## 2020-01-11 LAB — CUP PACEART REMOTE DEVICE CHECK
Date Time Interrogation Session: 20211013081240
Implantable Lead Implant Date: 20170714
Implantable Lead Implant Date: 20170714
Implantable Lead Location: 753859
Implantable Lead Location: 753860
Implantable Lead Model: 377
Implantable Lead Model: 377
Implantable Lead Serial Number: 49489173
Implantable Lead Serial Number: 49549002
Implantable Pulse Generator Implant Date: 20170714
Pulse Gen Model: 394969
Pulse Gen Serial Number: 68764743

## 2020-01-15 NOTE — Progress Notes (Signed)
Remote pacemaker transmission.   

## 2020-02-04 DIAGNOSIS — M170 Bilateral primary osteoarthritis of knee: Secondary | ICD-10-CM | POA: Diagnosis not present

## 2020-02-04 DIAGNOSIS — F324 Major depressive disorder, single episode, in partial remission: Secondary | ICD-10-CM | POA: Diagnosis not present

## 2020-02-04 DIAGNOSIS — Z23 Encounter for immunization: Secondary | ICD-10-CM | POA: Diagnosis not present

## 2020-02-04 DIAGNOSIS — Z0000 Encounter for general adult medical examination without abnormal findings: Secondary | ICD-10-CM | POA: Diagnosis not present

## 2020-02-04 DIAGNOSIS — F0390 Unspecified dementia without behavioral disturbance: Secondary | ICD-10-CM | POA: Diagnosis not present

## 2020-02-04 DIAGNOSIS — E1122 Type 2 diabetes mellitus with diabetic chronic kidney disease: Secondary | ICD-10-CM | POA: Diagnosis not present

## 2020-02-04 DIAGNOSIS — I10 Essential (primary) hypertension: Secondary | ICD-10-CM | POA: Diagnosis not present

## 2020-02-04 DIAGNOSIS — M109 Gout, unspecified: Secondary | ICD-10-CM | POA: Diagnosis not present

## 2020-02-04 DIAGNOSIS — I700 Atherosclerosis of aorta: Secondary | ICD-10-CM | POA: Diagnosis not present

## 2020-02-04 DIAGNOSIS — N1832 Chronic kidney disease, stage 3b: Secondary | ICD-10-CM | POA: Diagnosis not present

## 2020-02-04 DIAGNOSIS — E113291 Type 2 diabetes mellitus with mild nonproliferative diabetic retinopathy without macular edema, right eye: Secondary | ICD-10-CM | POA: Diagnosis not present

## 2020-02-04 DIAGNOSIS — E7800 Pure hypercholesterolemia, unspecified: Secondary | ICD-10-CM | POA: Diagnosis not present

## 2020-03-02 ENCOUNTER — Encounter (HOSPITAL_COMMUNITY)
Admission: EM | Disposition: A | Discharge status: Skilled Nursing Facility | Payer: Self-pay | Source: Home / Self Care | Attending: Internal Medicine

## 2020-03-02 ENCOUNTER — Encounter (HOSPITAL_COMMUNITY): Payer: Self-pay | Admitting: Certified Registered Nurse Anesthetist

## 2020-03-02 ENCOUNTER — Inpatient Hospital Stay (HOSPITAL_COMMUNITY)
Admission: EM | Admit: 2020-03-02 | Discharge: 2020-03-11 | DRG: 480 | Disposition: A | Discharge status: Skilled Nursing Facility | Payer: Medicare HMO | Attending: Internal Medicine | Admitting: Internal Medicine

## 2020-03-02 ENCOUNTER — Emergency Department (HOSPITAL_COMMUNITY): Payer: Medicare HMO

## 2020-03-02 ENCOUNTER — Other Ambulatory Visit: Payer: Self-pay

## 2020-03-02 ENCOUNTER — Encounter (HOSPITAL_COMMUNITY): Payer: Self-pay | Admitting: Emergency Medicine

## 2020-03-02 ENCOUNTER — Inpatient Hospital Stay (HOSPITAL_COMMUNITY): Payer: Medicare HMO

## 2020-03-02 DIAGNOSIS — S72141A Displaced intertrochanteric fracture of right femur, initial encounter for closed fracture: Secondary | ICD-10-CM | POA: Diagnosis not present

## 2020-03-02 DIAGNOSIS — N183 Chronic kidney disease, stage 3 unspecified: Secondary | ICD-10-CM | POA: Diagnosis not present

## 2020-03-02 DIAGNOSIS — S72001A Fracture of unspecified part of neck of right femur, initial encounter for closed fracture: Secondary | ICD-10-CM | POA: Diagnosis not present

## 2020-03-02 DIAGNOSIS — E785 Hyperlipidemia, unspecified: Secondary | ICD-10-CM | POA: Diagnosis present

## 2020-03-02 DIAGNOSIS — S51011A Laceration without foreign body of right elbow, initial encounter: Secondary | ICD-10-CM | POA: Diagnosis not present

## 2020-03-02 DIAGNOSIS — Z7982 Long term (current) use of aspirin: Secondary | ICD-10-CM | POA: Diagnosis not present

## 2020-03-02 DIAGNOSIS — W19XXXA Unspecified fall, initial encounter: Secondary | ICD-10-CM

## 2020-03-02 DIAGNOSIS — Z79899 Other long term (current) drug therapy: Secondary | ICD-10-CM | POA: Diagnosis not present

## 2020-03-02 DIAGNOSIS — Z7984 Long term (current) use of oral hypoglycemic drugs: Secondary | ICD-10-CM | POA: Diagnosis not present

## 2020-03-02 DIAGNOSIS — N179 Acute kidney failure, unspecified: Secondary | ICD-10-CM | POA: Diagnosis present

## 2020-03-02 DIAGNOSIS — N1832 Chronic kidney disease, stage 3b: Secondary | ICD-10-CM | POA: Diagnosis not present

## 2020-03-02 DIAGNOSIS — R1312 Dysphagia, oropharyngeal phase: Secondary | ICD-10-CM | POA: Diagnosis not present

## 2020-03-02 DIAGNOSIS — R2681 Unsteadiness on feet: Secondary | ICD-10-CM | POA: Diagnosis not present

## 2020-03-02 DIAGNOSIS — M6281 Muscle weakness (generalized): Secondary | ICD-10-CM | POA: Diagnosis not present

## 2020-03-02 DIAGNOSIS — U071 COVID-19: Secondary | ICD-10-CM | POA: Diagnosis not present

## 2020-03-02 DIAGNOSIS — E1122 Type 2 diabetes mellitus with diabetic chronic kidney disease: Secondary | ICD-10-CM | POA: Diagnosis not present

## 2020-03-02 DIAGNOSIS — E1029 Type 1 diabetes mellitus with other diabetic kidney complication: Secondary | ICD-10-CM | POA: Diagnosis present

## 2020-03-02 DIAGNOSIS — Z419 Encounter for procedure for purposes other than remedying health state, unspecified: Secondary | ICD-10-CM

## 2020-03-02 DIAGNOSIS — Z7401 Bed confinement status: Secondary | ICD-10-CM | POA: Diagnosis not present

## 2020-03-02 DIAGNOSIS — R197 Diarrhea, unspecified: Secondary | ICD-10-CM | POA: Diagnosis not present

## 2020-03-02 DIAGNOSIS — F0390 Unspecified dementia without behavioral disturbance: Secondary | ICD-10-CM | POA: Diagnosis present

## 2020-03-02 DIAGNOSIS — Y92002 Bathroom of unspecified non-institutional (private) residence single-family (private) house as the place of occurrence of the external cause: Secondary | ICD-10-CM | POA: Diagnosis not present

## 2020-03-02 DIAGNOSIS — S72101D Unspecified trochanteric fracture of right femur, subsequent encounter for closed fracture with routine healing: Secondary | ICD-10-CM | POA: Diagnosis not present

## 2020-03-02 DIAGNOSIS — S72101A Unspecified trochanteric fracture of right femur, initial encounter for closed fracture: Secondary | ICD-10-CM | POA: Diagnosis not present

## 2020-03-02 DIAGNOSIS — Z794 Long term (current) use of insulin: Secondary | ICD-10-CM | POA: Diagnosis not present

## 2020-03-02 DIAGNOSIS — I442 Atrioventricular block, complete: Secondary | ICD-10-CM | POA: Diagnosis present

## 2020-03-02 DIAGNOSIS — T380X5A Adverse effect of glucocorticoids and synthetic analogues, initial encounter: Secondary | ICD-10-CM | POA: Diagnosis not present

## 2020-03-02 DIAGNOSIS — I69391 Dysphagia following cerebral infarction: Secondary | ICD-10-CM | POA: Diagnosis not present

## 2020-03-02 DIAGNOSIS — Z8673 Personal history of transient ischemic attack (TIA), and cerebral infarction without residual deficits: Secondary | ICD-10-CM

## 2020-03-02 DIAGNOSIS — M25551 Pain in right hip: Secondary | ICD-10-CM | POA: Diagnosis present

## 2020-03-02 DIAGNOSIS — I509 Heart failure, unspecified: Secondary | ICD-10-CM | POA: Diagnosis present

## 2020-03-02 DIAGNOSIS — S72141D Displaced intertrochanteric fracture of right femur, subsequent encounter for closed fracture with routine healing: Secondary | ICD-10-CM | POA: Diagnosis not present

## 2020-03-02 DIAGNOSIS — M25561 Pain in right knee: Secondary | ICD-10-CM | POA: Diagnosis not present

## 2020-03-02 DIAGNOSIS — I120 Hypertensive chronic kidney disease with stage 5 chronic kidney disease or end stage renal disease: Secondary | ICD-10-CM | POA: Diagnosis not present

## 2020-03-02 DIAGNOSIS — S72009S Fracture of unspecified part of neck of unspecified femur, sequela: Secondary | ICD-10-CM | POA: Diagnosis present

## 2020-03-02 DIAGNOSIS — Z950 Presence of cardiac pacemaker: Secondary | ICD-10-CM | POA: Diagnosis present

## 2020-03-02 DIAGNOSIS — W010XXA Fall on same level from slipping, tripping and stumbling without subsequent striking against object, initial encounter: Secondary | ICD-10-CM | POA: Diagnosis present

## 2020-03-02 DIAGNOSIS — F32A Depression, unspecified: Secondary | ICD-10-CM | POA: Diagnosis present

## 2020-03-02 DIAGNOSIS — Z87891 Personal history of nicotine dependence: Secondary | ICD-10-CM

## 2020-03-02 DIAGNOSIS — I2510 Atherosclerotic heart disease of native coronary artery without angina pectoris: Secondary | ICD-10-CM | POA: Diagnosis present

## 2020-03-02 DIAGNOSIS — M810 Age-related osteoporosis without current pathological fracture: Secondary | ICD-10-CM | POA: Diagnosis not present

## 2020-03-02 DIAGNOSIS — R52 Pain, unspecified: Secondary | ICD-10-CM | POA: Diagnosis not present

## 2020-03-02 DIAGNOSIS — Z23 Encounter for immunization: Secondary | ICD-10-CM | POA: Diagnosis not present

## 2020-03-02 DIAGNOSIS — E876 Hypokalemia: Secondary | ICD-10-CM | POA: Diagnosis present

## 2020-03-02 DIAGNOSIS — I499 Cardiac arrhythmia, unspecified: Secondary | ICD-10-CM | POA: Diagnosis not present

## 2020-03-02 DIAGNOSIS — S79911A Unspecified injury of right hip, initial encounter: Secondary | ICD-10-CM | POA: Diagnosis not present

## 2020-03-02 DIAGNOSIS — I10 Essential (primary) hypertension: Secondary | ICD-10-CM | POA: Diagnosis present

## 2020-03-02 DIAGNOSIS — I129 Hypertensive chronic kidney disease with stage 1 through stage 4 chronic kidney disease, or unspecified chronic kidney disease: Secondary | ICD-10-CM | POA: Diagnosis not present

## 2020-03-02 DIAGNOSIS — M2550 Pain in unspecified joint: Secondary | ICD-10-CM | POA: Diagnosis not present

## 2020-03-02 DIAGNOSIS — R0902 Hypoxemia: Secondary | ICD-10-CM | POA: Diagnosis not present

## 2020-03-02 DIAGNOSIS — E118 Type 2 diabetes mellitus with unspecified complications: Secondary | ICD-10-CM | POA: Diagnosis present

## 2020-03-02 DIAGNOSIS — R55 Syncope and collapse: Secondary | ICD-10-CM | POA: Diagnosis not present

## 2020-03-02 DIAGNOSIS — I130 Hypertensive heart and chronic kidney disease with heart failure and stage 1 through stage 4 chronic kidney disease, or unspecified chronic kidney disease: Secondary | ICD-10-CM | POA: Diagnosis not present

## 2020-03-02 DIAGNOSIS — Z09 Encounter for follow-up examination after completed treatment for conditions other than malignant neoplasm: Secondary | ICD-10-CM

## 2020-03-02 DIAGNOSIS — E1165 Type 2 diabetes mellitus with hyperglycemia: Secondary | ICD-10-CM | POA: Diagnosis not present

## 2020-03-02 DIAGNOSIS — Z9181 History of falling: Secondary | ICD-10-CM | POA: Diagnosis not present

## 2020-03-02 DIAGNOSIS — Z01810 Encounter for preprocedural cardiovascular examination: Secondary | ICD-10-CM

## 2020-03-02 DIAGNOSIS — Z01818 Encounter for other preprocedural examination: Secondary | ICD-10-CM | POA: Diagnosis not present

## 2020-03-02 LAB — CBC WITH DIFFERENTIAL/PLATELET
Abs Immature Granulocytes: 0.03 10*3/uL (ref 0.00–0.07)
Basophils Absolute: 0 10*3/uL (ref 0.0–0.1)
Basophils Relative: 0 %
Eosinophils Absolute: 0.1 10*3/uL (ref 0.0–0.5)
Eosinophils Relative: 1 %
HCT: 42.1 % (ref 39.0–52.0)
Hemoglobin: 14.5 g/dL (ref 13.0–17.0)
Immature Granulocytes: 0 %
Lymphocytes Relative: 5 %
Lymphs Abs: 0.5 10*3/uL — ABNORMAL LOW (ref 0.7–4.0)
MCH: 32.7 pg (ref 26.0–34.0)
MCHC: 34.4 g/dL (ref 30.0–36.0)
MCV: 94.8 fL (ref 80.0–100.0)
Monocytes Absolute: 0.6 10*3/uL (ref 0.1–1.0)
Monocytes Relative: 6 %
Neutro Abs: 8.2 10*3/uL — ABNORMAL HIGH (ref 1.7–7.7)
Neutrophils Relative %: 88 %
Platelets: 175 10*3/uL (ref 150–400)
RBC: 4.44 MIL/uL (ref 4.22–5.81)
RDW: 12.5 % (ref 11.5–15.5)
WBC: 9.3 10*3/uL (ref 4.0–10.5)
nRBC: 0 % (ref 0.0–0.2)

## 2020-03-02 LAB — GLUCOSE, CAPILLARY
Glucose-Capillary: 175 mg/dL — ABNORMAL HIGH (ref 70–99)
Glucose-Capillary: 122 mg/dL — ABNORMAL HIGH (ref 70–99)

## 2020-03-02 LAB — RESP PANEL BY RT-PCR (FLU A&B, COVID) ARPGX2
Influenza A by PCR: NEGATIVE
Influenza B by PCR: NEGATIVE
SARS Coronavirus 2 by RT PCR: POSITIVE — AB

## 2020-03-02 LAB — BASIC METABOLIC PANEL
Anion gap: 14 (ref 5–15)
BUN: 49 mg/dL — ABNORMAL HIGH (ref 8–23)
CO2: 24 mmol/L (ref 22–32)
Calcium: 8.3 mg/dL — ABNORMAL LOW (ref 8.9–10.3)
Chloride: 98 mmol/L (ref 98–111)
Creatinine, Ser: 2.17 mg/dL — ABNORMAL HIGH (ref 0.61–1.24)
GFR, Estimated: 28 mL/min — ABNORMAL LOW (ref >60)
Glucose, Bld: 126 mg/dL — ABNORMAL HIGH (ref 70–99)
Potassium: 3.4 mmol/L — ABNORMAL LOW (ref 3.5–5.1)
Sodium: 136 mmol/L (ref 135–145)

## 2020-03-02 LAB — TYPE AND SCREEN
ABO/RH(D): A NEG
Antibody Screen: NEGATIVE

## 2020-03-02 LAB — HEMOGLOBIN A1C
Hgb A1c MFr Bld: 6.4 % — ABNORMAL HIGH (ref 4.8–5.6)
Mean Plasma Glucose: 136.98 mg/dL

## 2020-03-02 LAB — VITAMIN D 25 HYDROXY (VIT D DEFICIENCY, FRACTURES): Vit D, 25-Hydroxy: 33.66 ng/mL (ref 30–100)

## 2020-03-02 LAB — PROTIME-INR
INR: 1.1 (ref 0.8–1.2)
Prothrombin Time: 13.9 seconds (ref 11.4–15.2)

## 2020-03-02 LAB — MAGNESIUM: Magnesium: 1.6 mg/dL — ABNORMAL LOW (ref 1.7–2.4)

## 2020-03-02 SURGERY — FIXATION, FRACTURE, INTERTROCHANTERIC, WITH INTRAMEDULLARY ROD
Anesthesia: Choice | Laterality: Right

## 2020-03-02 MED ORDER — VITAMIN D 25 MCG (1000 UNIT) PO TABS
1000.0000 [IU] | ORAL_TABLET | Freq: Every day | ORAL | Status: DC
  Administered 2020-03-03 – 2020-03-11 (×9): 1000 [IU] via ORAL
  Filled 2020-03-02 (×9): qty 1

## 2020-03-02 MED ORDER — BISACODYL 5 MG PO TBEC
5.0000 mg | DELAYED_RELEASE_TABLET | Freq: Every day | ORAL | Status: DC | PRN
  Administered 2020-03-05: 5 mg via ORAL
  Filled 2020-03-02: qty 1

## 2020-03-02 MED ORDER — CEFAZOLIN SODIUM-DEXTROSE 2-4 GM/100ML-% IV SOLN: 2.0000 g | INTRAVENOUS | Status: DC

## 2020-03-02 MED ORDER — CHLORHEXIDINE GLUCONATE 4 % EX LIQD
60.0000 mL | Freq: Once | CUTANEOUS | Status: DC
  Filled 2020-03-02: qty 60

## 2020-03-02 MED ORDER — VITAMIN D 25 MCG (1000 UNIT) PO TABS
2000.0000 [IU] | ORAL_TABLET | Freq: Every day | ORAL | Status: DC
  Administered 2020-03-02: 2000 [IU] via ORAL
  Filled 2020-03-02: qty 2

## 2020-03-02 MED ORDER — LACTATED RINGERS IV SOLN: INTRAVENOUS | Status: AC

## 2020-03-02 MED ORDER — CHLORHEXIDINE GLUCONATE 4 % EX LIQD
60.0000 mL | Freq: Once | CUTANEOUS | Status: AC
  Administered 2020-03-03: 4 via TOPICAL
  Filled 2020-03-02: qty 60

## 2020-03-02 MED ORDER — ACETAMINOPHEN 500 MG PO TABS
1000.0000 mg | ORAL_TABLET | Freq: Three times a day (TID) | ORAL | Status: DC
  Administered 2020-03-02 – 2020-03-11 (×29): 1000 mg via ORAL
  Filled 2020-03-02 (×29): qty 2

## 2020-03-02 MED ORDER — POTASSIUM CHLORIDE CRYS ER 20 MEQ PO TBCR
40.0000 meq | EXTENDED_RELEASE_TABLET | Freq: Once | ORAL | Status: AC
  Administered 2020-03-02: 40 meq via ORAL
  Filled 2020-03-02: qty 2

## 2020-03-02 MED ORDER — POVIDONE-IODINE 10 % EX SWAB
2.0000 "application " | Freq: Once | CUTANEOUS | Status: AC
  Administered 2020-03-03: 2 via TOPICAL

## 2020-03-02 MED ORDER — FENTANYL CITRATE (PF) 100 MCG/2ML IJ SOLN
50.0000 ug | INTRAMUSCULAR | Status: DC | PRN
  Administered 2020-03-02: 50 ug via INTRAVENOUS
  Filled 2020-03-02: qty 2

## 2020-03-02 MED ORDER — TETANUS-DIPHTH-ACELL PERTUSSIS 5-2.5-18.5 LF-MCG/0.5 IM SUSY
0.5000 mL | PREFILLED_SYRINGE | Freq: Once | INTRAMUSCULAR | Status: AC
  Administered 2020-03-02: 0.5 mL via INTRAMUSCULAR
  Filled 2020-03-02: qty 0.5

## 2020-03-02 MED ORDER — ASPIRIN EC 325 MG PO TBEC
325.0000 mg | DELAYED_RELEASE_TABLET | Freq: Every day | ORAL | Status: DC
  Administered 2020-03-02 – 2020-03-11 (×10): 325 mg via ORAL
  Filled 2020-03-02 (×10): qty 1

## 2020-03-02 MED ORDER — HEPARIN SODIUM (PORCINE) 5000 UNIT/ML IJ SOLN
5000.0000 [IU] | Freq: Three times a day (TID) | INTRAMUSCULAR | Status: AC
  Administered 2020-03-02 (×2): 5000 [IU] via SUBCUTANEOUS
  Filled 2020-03-02 (×2): qty 1

## 2020-03-02 MED ORDER — POLYETHYLENE GLYCOL 3350 17 G PO PACK: 17.0000 g | PACK | Freq: Every day | ORAL | Status: DC | PRN

## 2020-03-02 MED ORDER — CEFAZOLIN SODIUM-DEXTROSE 2-4 GM/100ML-% IV SOLN
2.0000 g | INTRAVENOUS | Status: DC
  Filled 2020-03-02: qty 100

## 2020-03-02 MED ORDER — INSULIN ASPART 100 UNIT/ML ~~LOC~~ SOLN
0.0000 [IU] | Freq: Three times a day (TID) | SUBCUTANEOUS | Status: DC
  Administered 2020-03-02: 2 [IU] via SUBCUTANEOUS
  Administered 2020-03-03 (×2): 1 [IU] via SUBCUTANEOUS
  Administered 2020-03-04 (×3): 2 [IU] via SUBCUTANEOUS
  Administered 2020-03-05 – 2020-03-06 (×2): 1 [IU] via SUBCUTANEOUS
  Administered 2020-03-06: 2 [IU] via SUBCUTANEOUS
  Administered 2020-03-07: 3 [IU] via SUBCUTANEOUS
  Administered 2020-03-07 – 2020-03-08 (×2): 2 [IU] via SUBCUTANEOUS
  Administered 2020-03-09 – 2020-03-11 (×6): 1 [IU] via SUBCUTANEOUS

## 2020-03-02 MED ORDER — FENTANYL CITRATE (PF) 100 MCG/2ML IJ SOLN
50.0000 ug | Freq: Once | INTRAMUSCULAR | Status: AC
  Administered 2020-03-02: 50 ug via INTRAVENOUS
  Filled 2020-03-02: qty 2

## 2020-03-02 MED ORDER — SODIUM CHLORIDE 0.9 % IV BOLUS
500.0000 mL | Freq: Once | INTRAVENOUS | Status: AC
  Administered 2020-03-02: 500 mL via INTRAVENOUS

## 2020-03-02 MED ORDER — MAGNESIUM CHLORIDE 64 MG PO TBEC
1.0000 | DELAYED_RELEASE_TABLET | Freq: Two times a day (BID) | ORAL | Status: AC
  Administered 2020-03-02 (×2): 64 mg via ORAL
  Filled 2020-03-02 (×2): qty 1

## 2020-03-02 MED ORDER — OXYCODONE HCL 5 MG PO TABS
5.0000 mg | ORAL_TABLET | ORAL | Status: DC | PRN
  Administered 2020-03-02 – 2020-03-04 (×5): 5 mg via ORAL
  Administered 2020-03-04: 7.5 mg via ORAL
  Administered 2020-03-05 – 2020-03-11 (×3): 5 mg via ORAL
  Filled 2020-03-02 (×2): qty 1
  Filled 2020-03-02: qty 2
  Filled 2020-03-02 (×6): qty 1

## 2020-03-02 MED ORDER — POVIDONE-IODINE 10 % EX SWAB: 2.0000 "application " | Freq: Once | CUTANEOUS | Status: DC

## 2020-03-02 NOTE — ED Triage Notes (Signed)
Pt BIB EMS from home c/o fall and right hip pain. EMS reports pt attempted to go to the bathroom, messed himself, tried to change his pants and fell. Pt right leg is rotated inward and there is shortening. Denies hitting his head, LOC, and pt is not on blood thinners. Also reports pt tested COVID + with a home rapid test. Pt 83% on RA and 98% on 3L [place].   50 mcg Fentanyl given by EMS  18G RAC

## 2020-03-02 NOTE — ED Notes (Signed)
Lab notified to add-on new lab orders to previously collected blood work.

## 2020-03-02 NOTE — Consult Note (Signed)
Covid positive pt with R hip IT fracture.  To OR later today.  Keep NPO.  HOld blood thinners.  Time of surgery TBD

## 2020-03-02 NOTE — ED Notes (Signed)
Wife would like an update when we have one

## 2020-03-02 NOTE — Anesthesia Preprocedure Evaluation (Addendum)
Anesthesia Evaluation  Patient identified by MRN, date of birth, ID band Patient awake    Reviewed: Allergy & Precautions, H&P , NPO status , Patient's Chart, lab work & pertinent test results  Airway Mallampati: II  TM Distance: >3 FB Neck ROM: Full    Dental no notable dental hx. (+) Teeth Intact, Dental Advisory Given   Pulmonary neg pulmonary ROS, former smoker,    Pulmonary exam normal breath sounds clear to auscultation       Cardiovascular Exercise Tolerance: Good hypertension, Pt. on medications + pacemaker  Rhythm:Regular Rate:Normal     Neuro/Psych TIAnegative psych ROS   GI/Hepatic negative GI ROS, Neg liver ROS,   Endo/Other  diabetes, Insulin Dependent, Oral Hypoglycemic Agents  Renal/GU negative Renal ROS  negative genitourinary   Musculoskeletal  (+) Arthritis , Osteoarthritis,    Abdominal   Peds  Hematology negative hematology ROS (+)   Anesthesia Other Findings   Reproductive/Obstetrics negative OB ROS                            Anesthesia Physical Anesthesia Plan  ASA: II  Anesthesia Plan: MAC and Spinal   Post-op Pain Management:    Induction: Intravenous  PONV Risk Score and Plan: 2 and Propofol infusion and Ondansetron  Airway Management Planned: Simple Face Mask  Additional Equipment:   Intra-op Plan:   Post-operative Plan:   Informed Consent: I have reviewed the patients History and Physical, chart, labs and discussed the procedure including the risks, benefits and alternatives for the proposed anesthesia with the patient or authorized representative who has indicated his/her understanding and acceptance.     Dental advisory given  Plan Discussed with: CRNA  Anesthesia Plan Comments:        Anesthesia Quick Evaluation

## 2020-03-02 NOTE — H&P (Addendum)
History and Physical  Patient Name: Noah Adkins     UQJ:[id number]    DOB: 03/19/1927    DOA: 03/02/2020 PCP: Donald Prose, MD  Outpatient specialists:  Patient coming from: home where he lives with his wife. Chief Complaint: Hip pain and fall  HPI: Noah Adkins is a 86 y.o. male with a history of diabetes mellitus, hypertension, complete heart block and sick sinus syndrome s/p Pacemaker who follows with Dr. Lovena Le, has known CAD & RBBB, h/o TIA with "jibberish" speech that resolved, who presents to the ED via EMS status post mechanical fall with complaints of right hip pain.  Patient states that he was in the bathroom and slipped on the wet floor falling onto his right hip.  He denies head injury or loss of consciousness.  He has not been able to ambulate since the fall.  He is complaining of pain only to his right hip, he states that he did scrape his elbows as well.  He denies headache, neck pain, back pain, chest pain, abdominal pain, numbness, or weakness.  He also mentions that he had a positive at-home COVID-19 swab, had a mild cough, tested positive about a week ago.  He denies any fever, or shortness of breath to me.  He has had his COVID vaccine  At home, it takes him a long time to get to the bathroom, he is somewhat sedentary and likes to sit in his chair.  Can do ADL's though, doesn't do much that exerts himself. Not sure if he can climb the stairs.    There was no preceding dizziness, weakness, lightheadedness or vertigo, no chest discomfort, palpitations, or dyspnea, nor are there any of those symptoms now.  The patient denies active heart issues, angina, or history of MI but has documented CAD. He does not typically exert to an equivalent of 4 METS, but believes that she could without dyspnea.  He was previously a smoker but doesn't use inhalers, wife and him don't think OSA.   has no history of COPD or symptoms of OSA, including snoring, daytime drowsiness, apnea.  He has no history  of prolonged steroid use and does not use insulin.  She does not use alcohol, and has no history of withdrawal symptoms or delirium in the context of previous surgeries.  In the emergency department, hemodynamically stable with HR  81, 127/88, 95% on room air NA 136, K3.4, INR 1.1, WBC 9.3, Hgb 14.5, NA 136, platelet 175, creatinine 2.17 **Covid positive** CXR-pacemaker leads Orthopedics was consulted in the emergency department and plans for surgery today, the patient is n.p.o.  Anesthesia Specific concerns: Presence of loose teeth: None Anesthesia problems in past: None History of bleeding disorder: None  Review of Systems:  Review of Systems  Constitutional: Positive for malaise/fatigue (with covid). Negative for chills, fever and weight loss.  HENT: Positive for congestion and hearing loss.   Eyes: Negative for blurred vision, double vision and pain.  Respiratory: Positive for cough and shortness of breath (with covid).   Cardiovascular: Negative for chest pain, palpitations, orthopnea, leg swelling and PND.  Gastrointestinal: Negative for diarrhea, nausea and vomiting.  Genitourinary: Negative for dysuria, frequency and urgency.  Musculoskeletal: Positive for joint pain. Negative for neck pain.  Skin: Negative for rash.  Neurological: Negative for dizziness, tingling, weakness and headaches.  Psychiatric/Behavioral: Negative for depression and suicidal ideas.    Past Medical History:  Diagnosis Date  . Arthritis    "knees, hands" (10/10/2015)  . History  of blood transfusion 1970s   "related to work related injury; broke rib; lost blood"  . History of gout   . Hypertension   . Kidney stones   . Presence of permanent cardiac pacemaker   . Type II diabetes mellitus ([place])     Past Surgical History:  Procedure Laterality Date  . CATARACT EXTRACTION Right 2015  . CYSTOSCOPY W/ STONE MANIPULATION  1970s  . EP IMPLANTABLE DEVICE N/A 10/10/2015   Procedure: Pacemaker Implant;   Surgeon: Evans Lance, MD;  Location: [facility];  Service: Cardiovascular;  Laterality: N/A;  . INSERT / REPLACE / REMOVE PACEMAKER  10/10/2015    Social History: Patient lives at home with wife and son..  Patient walks with a scooter at times  reports that he has quit smoking. His smoking use included pipe and cigars. He quit after 30.00 years of use. He has quit using smokeless tobacco.  His smokeless tobacco use included chew. He reports that he does not drink alcohol and does not use drugs.  Allergies  Allergen Reactions  . Atenolol Nausea Only and Other (See Comments)    Dizzy, headache  . Dilacor Xr [Diltiazem Hcl] Other (See Comments)    NOT EFFECTIVE  . Isoptin Sr [Verapamil Hcl Er] Other (See Comments)    Erectile dysfunction  . Lopressor [Metoprolol Tartrate] Nausea Only and Other (See Comments)    dizzy, headache  . Norvasc [Amlodipine Besylate] Other (See Comments)    Headache and erectile dysfunction    Family history: family history includes Diabetes in his mother; Hypertension in his father.  Prior to Admission medications   Medication Sig Start Date End Date Taking? Authorizing Provider  allopurinol (ZYLOPRIM) 300 MG tablet Take 300 mg by mouth daily.  09/30/13   [provider]  aspirin EC 325 MG EC tablet Take 1 tablet (325 mg total) by mouth daily. 04/12/17   Doreatha Lew, MD  diphenhydrAMINE (BENADRYL) 25 mg capsule Take 25 mg by mouth every 6 (six) hours as needed for itching, allergies or sleep.    [provider]  DULoxetine (CYMBALTA) 60 MG capsule Take 60 mg by mouth daily. 01/24/20   [provider]  felodipine (PLENDIL) 5 MG 24 hr tablet Take 5 mg by mouth daily.  09/17/13   [provider]  fluticasone (FLONASE) 50 MCG/ACT nasal spray Place 1 spray into both nostrils daily.  09/17/13   [provider]  glipiZIDE (GLUCOTROL XL) 2.5 MG 24 hr tablet Take 2.5 mg by mouth daily. 10/17/17   [provider]  guaiFENesin-dextromethorphan (ROBITUSSIN DM) 100-10 MG/5ML syrup Take 5 mLs by mouth every 4 (four) hours as needed for cough.    [provider]  ibuprofen (ADVIL,MOTRIN) 200 MG tablet Take 200-400 mg by mouth every 6 (six) hours as needed for fever, headache, mild pain, moderate pain or cramping.    [provider]  lisinopril-hydrochlorothiazide (PRINZIDE,ZESTORETIC) 20-25 MG per tablet Take 1 tablet by mouth daily.  09/17/13   [provider]  memantine (NAMENDA) 10 MG tablet Take 10 mg by mouth daily. 12/03/19   [provider]  naproxen sodium (ALEVE) 220 MG tablet Take 220 mg by mouth 2 (two) times daily as needed (pain).    [provider]  omeprazole (PRILOSEC) 40 MG capsule Take 40 mg by mouth daily. 09/20/17   [provider]  oxybutynin (DITROPAN) 5 MG tablet Take 5 mg by mouth 2 (two) times daily. 01/26/17   [provider]  oxyCODONE-acetaminophen (PERCOCET/ROXICET) 5-325 MG tablet Take 1 tablet by mouth every 6 (six) hours as needed (pain).  09/13/15   [provider]  simvastatin (ZOCOR) 10 MG tablet Take 10 mg by mouth at bedtime.  02/25/17   [provider]  solifenacin (VESICARE) 10 MG tablet Take 10 mg by mouth at bedtime.    [provider]  tetrahydrozoline 0.05 % ophthalmic solution Place 1 drop into both eyes as needed (dry eyes).    [provider]  traMADol (ULTRAM) 50 MG tablet Take 50 mg by mouth 2 (two) times daily as needed (pain).    [provider]  traZODone (DESYREL) 150 MG tablet Take 150 mg by mouth at bedtime. 09/22/17   [provider]       Physical Exam: BP 137/69   Pulse 84   Temp 99.3 F (37.4 C) (Oral)   Resp 20   SpO2 93%  General appearance: Well-developed, adult male, alert and in no distress .   Eyes: Anicteric, conjunctiva pink, lids and lashes normal. PERRL.    ENT: No nasal deformity, discharge, epistaxis.  Hearing  impaired. OP moist without lesions.  Dentition poor, no teeth.    Lymph: No cervical, supraclavicular or axillary lymphadenopathy. Skin: Warm and dry.  No jaundice.  No suspicious rashes or lesions. Cardiac: RRR, nl S1-S2, no murmurs appreciated.  Capillary refill is brisk.  JVP normalnot visible.  No LE edema.  Radial and  pulses 2+ and symmetric.  No carotid bruits. Respiratory: Normal respiratory rate and rhythm.  CTAB without rales or wheezes. GI: Abdomen soft without rigidity.  no TTP. No ascites, distension, no hepatosplenomegaly.  MSK: Right leg foreshortened and externally rotated.  No effusions.  No clubbing or cyanosis. Neuro: Sensorium intact and responding to questions, attention normal.  Speech is fluent.  Moves all extremities equally and with normal coordination.    Psych: The patient is oriented to time, place and person.  Behavior appropriate.  Affect is okay.  Memory is not intact     Labs on Admission:  I have personally reviewed following labs and imaging studies: CBC: Recent Labs  Lab 03/02/20 0210  WBC 9.3  NEUTROABS 8.2*  HGB 14.5  HCT 42.1  MCV 94.8  PLT 256   Basic Metabolic Panel: Recent Labs  Lab 03/02/20 0210  NA 136  K 3.4*  CL 98  CO2 24  GLUCOSE 126*  BUN 49*  CREATININE 2.17*  CALCIUM 8.3*   GFR: CrCl cannot be calculated (Unknown ideal weight.).  Liver Function Tests: No results for input(s): AST, ALT, ALKPHOS, BILITOT, PROT, ALBUMIN in the last 168 hours. No results for input(s): LIPASE, AMYLASE in the last 168 hours. No results for input(s): AMMONIA in the last 168 hours. Coagulation Profile: Recent Labs  Lab 03/02/20 0210  INR 1.1   Cardiac Enzymes: No results for input(s): CKTOTAL, CKMB, CKMBINDEX, TROPONINI in the last 168 hours. BNP (last 3 results) No results for input(s): PROBNP in the last 8760 hours. HbA1C: No results for input(s): HGBA1C in the last 72 hours. CBG: No results for input(s): GLUCAP in the last 168  hours. Lipid Profile: No results for input(s): CHOL, HDL, LDLCALC, TRIG, CHOLHDL, LDLDIRECT in the last 72 hours. Thyroid Function Tests: No results for input(s): TSH, T4TOTAL, FREET4, T3FREE, THYROIDAB in the last 72 hours. Anemia Panel: No results for input(s): VITAMINB12, FOLATE, FERRITIN, TIBC, IRON, RETICCTPCT in the last 72 hours.    Radiological Exams on Admission: Personally  reviewed: DG Chest Port 1 View  Result Date: 03/02/2020 CLINICAL DATA:  Preop for right hip surgery EXAM: PORTABLE CHEST 1 VIEW COMPARISON:  None. FINDINGS: The heart size and mediastinal contours are within normal limits. Both lungs are clear. The visualized skeletal structures are unremarkable. Unchanged position of left chest wall pacemaker leads. IMPRESSION: No active disease. Electronically Signed   By: Ulyses Jarred M.D.   On: 03/02/2020 02:45   DG Hip Unilat With Pelvis 2-3 Views Right  Result Date: 03/02/2020 CLINICAL DATA:  Fall EXAM: DG HIP (WITH OR WITHOUT PELVIS) 2-3V RIGHT COMPARISON:  None. FINDINGS: There is a comminuted intertrochanteric fracture of the right femur. No dislocation. Bony pelvis is otherwise intact. IMPRESSION: Comminuted intertrochanteric fracture of the right femur. Electronically Signed   By: Ulyses Jarred M.D.   On: 03/02/2020 02:46    EKG: Independently reviewed. Atrial sensed ventricular paced rhythm at 78, no ischemic changes that I can tell  Active Problems:   Hip fracture (HCC)  Assessment and Plan: 1. Hip fracture: **COVID POSITIVE** The patient will be seen by Dr. Doran Durand in the morning at [facility], to evaluate for operative fixation of the R hip.   -Admit to med-surg bed -tylenol  -oxycodone prn, takes percocet at home I think  --monitor bowels, prns ordered -Bed rest, apply ice, document sedation and vitals per Hip fracture protocol -NPO at midnight -MIVF -Nutrition consulted -Hold following meds:  -ACE/ARB --High risk for delirium given memory concerns and  hard of hearing, reorient  Covid positive here, he is vaccinated Isolation, continue to monitor Possible infusion candidate deferred to pharmacy, appreciate their help  CKD of atleast III, -- Elevated creatinine of 2.17, baseline of maybe around 1.81 back in 2019, ctm, ace/arb as elsewhere, renally dose Hypokalemia, replace with oral  Skin tear of right elbow, wound care, present on admission, continue to monitor OAB?-Oxybutynin, Vesicare DM-hold oral medicines, sliding scale, hemoglobin a1c follow up HTN-hold lisinopril and will likely continue hydrochlorothiazide, continue felodipine Known CAD hx, HLD-simvastatin 10 mg nightly Depression - cont' cymbalta ?memory concerns - memantine  Hypocalcemia-check vitamin D, will go ahead and add vitamin D, continue to monitor -Osteoporosis-follow-up in outpatient with a DEXA and consider bisphosphonate Post-operative medical care: Per AAOS 2014 guidelines on hip fractures in the elderly: -Recommend osteoporosis screening after discharge if not done previously -Recommend atleast vitamin D 800 IUand calcium 1200 mg supplementation after discharge  PREOPERATIVE CLEARANCE Overall, the patient is at medium risk for the planned surgery which I presume to be low/medium risk.  Patient has a RCRI score of 3 (active cardiac condition, CHF, CAD, DM treated with insulin, TIA/CVA, Cr > 2.0). Which equates to 15% risk of death, MI or cardiac arrast.  The patient has no active cardiac symptoms, doubtful 4 MET functional capacity -No further testing needed --given the urgency of the surgery I feel he is as optimized as possible, in order to have surgery within 24 hours for a mortality benefit --the wife expressed understanding to the risk and she is next of kin. --per plan of care, she asked for an update from orthopedics.  Pulmonary: Patient does not have diagnosed COPD or OSA. Patient is at average risk for pulmonary complications.   Endocrine: Patient has  history of DM  Heme: Transfusion threshold 8 mg/dL.    DVT prophylaxis: SCDs, heparin Diet: NPO Code Status: Full code  Family Communication: Wife Shauna Hugh [phone]  Disposition Plan: Anticipate evaluation by Orthopedics and  surgical fixation on 12/5 or 12/6,  then PT evaluation and discharge to SNF in 2-3 days. Admission status: INPATIENT for hip fracture, medical surgical bed  Medical decision making and consults: Patient seen at 8:16 AM on 03/02/2020.   . What exists of the patient's chart was reviewed in depth  and summarized above.  Clinical condition: stable   Sueanne Margarita [facility]

## 2020-03-02 NOTE — Plan of Care (Addendum)
Patient's wife would like an update about surgery, she liked the idea of doing spinal block and she understands the risk of surgery and is okay to proceed to my understanding.  Can you please give wife a call to talk about the details of planning.  Diane [phone]  bc surgery moved to tomorrow, started him on heparin q8 x2 and gave diet npo at midnight.  Oxygen satting fine and no need to place on remdesevir or dexamethasone

## 2020-03-02 NOTE — ED Provider Notes (Addendum)
[facility]-EMERGENCY DEPT Provider Note   CSN: [id number] Arrival date & time: 03/02/20  0040     History Chief Complaint  Patient presents with  . Fall  . Hip Pain    Noah Adkins is a 86 y.o. male with a history of diabetes mellitus, hypertension, & RBBB who presents to the ED via EMS status post mechanical fall with complaints of right hip pain.  Patient states that he was in the bathroom and slipped on the wet floor falling onto his right hip.  He denies head injury or loss of consciousness.  He has not been able to ambulate since the fall.  He is complaining of pain only to his right hip, he states that he did scrape his elbows as well.  He denies headache, neck pain, back pain, chest pain, abdominal pain, numbness, or weakness.  He also mentions that he had a positive at-home COVID-19 swab, had a mild cough, he is unsure when this was, but was within past 1 week.  He denies any fever, or shortness of breath to me.  He has had his COVID vaccine.  Last p.o. intake with supper last night.  He denies blood thinner use.  HPI     Past Medical History:  Diagnosis Date  . Arthritis    "knees, hands" (10/10/2015)  . History of blood transfusion 1970s   "related to work related injury; broke rib; lost blood"  . History of gout   . Hypertension   . Kidney stones   . Presence of permanent cardiac pacemaker   . Type II diabetes mellitus [facility])     Patient Active Problem List   Diagnosis Date Noted  . TIA (transient ischemic attack) 04/10/2017  . Syncopal seizure (HCC)   . Syncope 10/10/2015  . Symptomatic bradycardia 10/10/2015  . Postural hypotension 10/30/2013  . Preop cardiovascular exam 10/30/2013  . RBBB 10/30/2013  . DM (diabetes mellitus) type I controlled with renal manifestation (HCC) 10/30/2013    Past Surgical History:  Procedure Laterality Date  . CATARACT EXTRACTION Right 2015  . CYSTOSCOPY W/ STONE MANIPULATION  1970s  . EP IMPLANTABLE  DEVICE N/A 10/10/2015   Procedure: Pacemaker Implant;  Surgeon: Marinus Maw, MD;  Location: [facility] INVASIVE CV LAB;  Service: Cardiovascular;  Laterality: N/A;  . INSERT / REPLACE / REMOVE PACEMAKER  10/10/2015       Family History  Problem Relation Age of Onset  . Hypertension Father   . Diabetes Mother     Social History   Tobacco Use  . Smoking status: Former Smoker    Years: 30.00    Types: Pipe, Cigars  . Smokeless tobacco: Former Neurosurgeon    Types: Chew  . Tobacco comment: 10/10/2015 "quit smoking cigarettes & chewing tobacco in the 1980s"  Vaping Use  . Vaping Use: Never used  Substance Use Topics  . Alcohol use: No    Alcohol/week: 0.0 standard drinks    Comment: 10/10/2015 "last year I had 2 beers"  . Drug use: No    Home Medications Prior to Admission medications   Medication Sig Start Date End Date Taking? Authorizing Provider  allopurinol (ZYLOPRIM) 300 MG tablet Take 300 mg by mouth daily.  09/30/13   [provider]  aspirin EC 325 MG EC tablet Take 1 tablet (325 mg total) by mouth daily. 04/12/17   Lenox Ponds, MD  diphenhydrAMINE (BENADRYL) 25 mg capsule Take 25 mg by mouth every 6 (six) hours as  needed for itching, allergies or sleep.    [provider]  felodipine (PLENDIL) 5 MG 24 hr tablet Take 5 mg by mouth daily.  09/17/13   [provider]  fluticasone (FLONASE) 50 MCG/ACT nasal spray Place 1 spray into both nostrils daily.  09/17/13   [provider]  glipiZIDE (GLUCOTROL XL) 2.5 MG 24 hr tablet Take 2.5 mg by mouth daily. 10/17/17   [provider]  guaiFENesin-dextromethorphan (ROBITUSSIN DM) 100-10 MG/5ML syrup Take 5 mLs by mouth every 4 (four) hours as needed for cough.    [provider]  ibuprofen (ADVIL,MOTRIN) 200 MG tablet Take 200-400 mg by mouth every 6 (six) hours as needed for fever, headache, mild pain, moderate pain or cramping.    [provider]  lisinopril-hydrochlorothiazide  (PRINZIDE,ZESTORETIC) 20-25 MG per tablet Take 1 tablet by mouth daily.  09/17/13   [provider]  naproxen sodium (ALEVE) 220 MG tablet Take 220 mg by mouth 2 (two) times daily as needed (pain).    [provider]  omeprazole (PRILOSEC) 40 MG capsule Take 40 mg by mouth daily. 09/20/17   [provider]  oxybutynin (DITROPAN) 5 MG tablet Take 5 mg by mouth 2 (two) times daily. 01/26/17   [provider]  oxyCODONE-acetaminophen (PERCOCET/ROXICET) 5-325 MG tablet Take 1 tablet by mouth every 6 (six) hours as needed (pain).  09/13/15   [provider]  simvastatin (ZOCOR) 10 MG tablet Take 10 mg by mouth at bedtime.  02/25/17   [provider]  solifenacin (VESICARE) 10 MG tablet Take 10 mg by mouth at bedtime.    [provider]  tetrahydrozoline 0.05 % ophthalmic solution Place 1 drop into both eyes as needed (dry eyes).    [provider]  traMADol (ULTRAM) 50 MG tablet Take 50 mg by mouth 2 (two) times daily as needed (pain).    [provider]  traZODone (DESYREL) 150 MG tablet Take 150 mg by mouth at bedtime. 09/22/17   [provider]    Allergies    Atenolol, Dilacor xr [diltiazem hcl], Isoptin sr [verapamil hcl er], Lopressor [metoprolol tartrate], and Norvasc [amlodipine besylate]  Review of Systems   Review of Systems  Constitutional: Negative for chills and fever.  Respiratory: Positive for cough. Negative for shortness of breath.   Cardiovascular: Negative for chest pain.  Gastrointestinal: Negative for abdominal pain and vomiting.  Musculoskeletal: Positive for arthralgias. Negative for back pain and neck pain.  Skin: Positive for wound.  Neurological: Negative for dizziness, syncope, weakness, numbness and headaches.  All other systems reviewed and are negative.   Physical Exam Updated Vital Signs BP (!) 145/71   Pulse 76   Temp 99.8 F (37.7 C) (Oral)   Resp 20   SpO2 92%    Physical Exam Vitals and nursing note reviewed.  Constitutional:      General: He is not in acute distress.    Appearance: He is not toxic-appearing.  HENT:     Head: Normocephalic and atraumatic.     Comments: No raccoon eyes or battle sign.    Ears:     Comments: No hemotympanum.    Nose: Nose normal.     Mouth/Throat:     Comments: Uvula midline. Eyes:     Extraocular Movements: Extraocular movements intact.     Pupils: Pupils are equal, round, and reactive to light.  Neck:     Comments: No midline spinal tenderness to palpation.  Range of motion intact  without pain. Cardiovascular:     Rate and Rhythm: Normal rate and regular rhythm.     Comments: 2+ symmetric radial & DP pulses bilaterally. Pulmonary:     Effort: Pulmonary effort is normal. No respiratory distress.     Breath sounds: No wheezing or rales.     Comments: SPO2 92% on room air on my assessment. Chest:     Chest wall: No tenderness.  Abdominal:     General: There is no distension.     Palpations: Abdomen is soft.     Tenderness: There is no abdominal tenderness. There is no guarding or rebound.  Musculoskeletal:     Cervical back: Neck supple.     Comments: Upper extremities: Patient has skin tears to the bilateral posterior elbows without significant active bleeding or visible foreign bodies.  No obvious deformities.  Intact active range of motion throughout the bilateral upper extremities.  No focal bony tenderness to palpation. Back: No midline tenderness or palpable step-off Lower extremities: RLE is externally rotated & shortened some. No significant open wounds or ecchymosis.  Left lower extremity major joints able to be passively ranged without pain.  Right lower extremity ankle with intact active range of motion, right hip and knee movement both with attempt actively as well as passively cause patient pain.  Patient is only tender to palpation to the right lateral hip over the greater trochanter area.   Lower extremities are otherwise nontender.  Specifically no tenderness to the right knee.  Neurological:     Mental Status: He is alert.     Comments: Alert.  Clear speech.  CN III through XII grossly intact.  Sensation grossly intact bilateral upper and lower extremities.  5 out of 5 symmetric grip strength.  5 out of 5 strength with plantar dorsiflexion bilaterally.     ED Results / Procedures / Treatments   Labs (all labs ordered are listed, but only abnormal results are displayed) Labs Reviewed  RESP PANEL BY RT-PCR (FLU A&B, COVID) ARPGX2 - Abnormal; Notable for the following components:      Result Value   SARS Coronavirus 2 by RT PCR POSITIVE (*)    All other components within normal limits  BASIC METABOLIC PANEL - Abnormal; Notable for the following components:   Potassium 3.4 (*)    Glucose, Bld 126 (*)    BUN 49 (*)    Creatinine, Ser 2.17 (*)    Calcium 8.3 (*)    GFR, Estimated 28 (*)    All other components within normal limits  CBC WITH DIFFERENTIAL/PLATELET - Abnormal; Notable for the following components:   Neutro Abs 8.2 (*)    Lymphs Abs 0.5 (*)    All other components within normal limits  PROTIME-INR  TYPE AND SCREEN    EKG EKG Interpretation  Date/Time:  Sunday March 02 2020 02:00:56 EST Ventricular Rate:  78 PR Interval:    QRS Duration: 149 QT Interval:  437 QTC Calculation: 498 R Axis:   -89 Text Interpretation: Atrial-sensed ventricular-paced rhythm When compared with ECG of 11/11/2017 Confirmed by Dione BoozeGlick, David ([id number]) on 03/02/2020 6:52:02 AM   Radiology DG Chest Port 1 View  Result Date: 03/02/2020 CLINICAL DATA:  Preop for right hip surgery EXAM: PORTABLE CHEST 1 VIEW COMPARISON:  None. FINDINGS: The heart size and mediastinal contours are within normal limits. Both lungs are clear. The visualized skeletal structures are unremarkable. Unchanged position of left chest wall pacemaker leads. IMPRESSION: No active disease. Electronically Signed  By: Deatra Robinson M.D.   On: 03/02/2020 02:45   DG Hip Unilat With Pelvis 2-3 Views Right  Result Date: 03/02/2020 CLINICAL DATA:  Fall EXAM: DG HIP (WITH OR WITHOUT PELVIS) 2-3V RIGHT COMPARISON:  None. FINDINGS: There is a comminuted intertrochanteric fracture of the right femur. No dislocation. Bony pelvis is otherwise intact. IMPRESSION: Comminuted intertrochanteric fracture of the right femur. Electronically Signed   By: Deatra Robinson M.D.   On: 03/02/2020 02:46    Procedures Procedures (including critical care time)  Medications Ordered in ED Medications  fentaNYL (SUBLIMAZE) injection 50 mcg (50 mcg Intravenous Given 03/02/20 0202)  Tdap (BOOSTRIX) injection 0.5 mL (has no administration in time range)    ED Course  I have reviewed the triage vital signs and the nursing notes.  Pertinent labs & imaging results that were available during my care of the patient were reviewed by me and considered in my medical decision making (see chart for details).    MDM Rules/Calculators/A&P                         Patient presents to the ED status post mechanical fall with right hip pain.  Patient is nontoxic, on my assessment SPO2 92% on room air without signs of respiratory distress, vitals otherwise unremarkable.  There is question of positive at home COVID-19 test recently-Covid testing in the emergency department has been ordered.  No signs of serious head, neck, back, or chest/abdominal injury.  Patient has no midline spinal tenderness or focal neurologic deficits.  No chest/abdominal tenderness.  Right lower extremity with external rotation mild shortening, significant pain with passive range of motion of the right hip as well as tenderness to palpation.  Neurovascularly intact distally.  Does have skin tears/abrasions to the bilateral elbows, do not appear to require closure, tetanus will be updated, range of motion intact and areas are nontender, doubt underlying fracture/dislocation.  Plan  for hip and chest x-rays, basic labs, and EKG.  Fentanyl ordered for pain.  Additional history obtained:  Additional history obtained from chart review and nursing note reviewed..   Lab Tests:  I Ordered, reviewed, and interpreted labs, which included:  CBC: Fairly unremarkable BMP: Uptrending BUN and creatinine.  Mild hypokalemia. PT/INR: Within normal limits. Covid/flu testing: Covid positive.  Imaging Studies ordered:  I ordered imaging studies which included CXR & R hip/pelvis x-ray, I independently visualized and interpreted imaging which showed right comminuted intertrochanteric fracture.   Plan for admission for intertrochanteric fx.  In terms of covid, remains on RA without respiratory distress.   04:00: CONSULT: Discussed with orthopedic surgeon Dr. Victorino Dike, keep patient n.p.o., do not give any blood thinners, plan for OR today.  Admit to [facility].     05:00: Discussed w/ hospitalist service, Dr. Julian Reil- AM team to admit.   I called and spoke with patient's wife via telephone, updated on results and plan of care.  06:45: RE-EVAL: Patient states pain is starting to come back some, additional fentanyl ordered, will give 500 cc of fluid given his elevated BUN and creatinine. Pending a.m. hospitalist team assessment and admission.  Portions of this note were generated with Scientist, clinical (histocompatibility and immunogenetics). Dictation errors may occur despite best attempts at proofreading.  Final Clinical Impression(s) / ED Diagnoses Final diagnoses:  Fall, initial encounter  Closed displaced intertrochanteric fracture of right femur, initial encounter (HCC)  COVID-19    Rx / DC Orders ED Discharge Orders    None  Cherly Anderson, PA-C 03/02/20 0923    Cherly Anderson, PA-C 03/02/20 0706    Dione Booze, MD 03/02/20 [phone]

## 2020-03-02 NOTE — Assessment & Plan Note (Addendum)
"  was speaking jibberish" and then resolved ~10/2019, wife took pt to the ED

## 2020-03-02 NOTE — Progress Notes (Signed)
Pt arrived to the floor in room 1534 around 1000. Temp of 99.5 will continue to monitor.  BP of 152/69 All other vitals charted. Pt alert and Ox3. He was oriented to unit.

## 2020-03-02 NOTE — Consult Note (Signed)
Reason for Consult: right hip fracture Referring Physician:  Thornell Mule, DO  Noah Adkins is an 86 y.o. male.  HPI: Noah Adkins is a 86 y.o. male with a history of diabetes mellitus, hypertension, complete heart block and sick sinus syndrome s/p Pacemaker who follows with Dr. Ladona Ridgel, has known CAD & RBBB, h/o TIA with "jibberish" speech that resolved, who presents to the ED via EMS status post mechanical fall with complaints of right hip pain.  Patient states that he was in the bathroom and slipped on the wet floor falling onto his right hip.  He denies head injury or loss of consciousness.  He has not been able to ambulate since the fall.  He is complaining of pain only to his right hip, he states that he did scrape his elbows as well.  He denies headache, neck pain, back pain, chest pain, abdominal pain, numbness, or weakness.  He also mentions that he had a positive at-home COVID-19 swab, had a mild cough, tested positive about a week ago.  He denies any fever, or shortness of breath to me.  He has had his COVID vaccine  At home, it takes him a long time to get to the bathroom, he is somewhat sedentary and likes to sit in his chair.  Can do ADL's though, doesn't do much that exerts himself. Not sure if he can climb the stairs.    There was no preceding dizziness, weakness, lightheadedness or vertigo, no chest discomfort, palpitations, or dyspnea, nor are there any of those symptoms now.  The patient denies active heart issues, angina, or history of MI but has documented CAD. He does not typically exert to an equivalent of 4 METS, but believes that she could without dyspnea.  He was previously a smoker but doesn't use inhalers, wife and him don't think OSA.   has no history of COPD or symptoms of OSA, including snoring, daytime drowsiness, apnea.  He has no history of prolonged steroid use and does not use insulin.  She does not use alcohol, and has no history of withdrawal symptoms or delirium in the  context of previous surgeries.  Past Medical History:  Diagnosis Date  . Arthritis    "knees, hands" (10/10/2015)  . History of blood transfusion 1970s   "related to work related injury; broke rib; lost blood"  . History of gout   . Hypertension   . Kidney stones   . Presence of permanent cardiac pacemaker   . Type II diabetes mellitus (HCC)     Past Surgical History:  Procedure Laterality Date  . CATARACT EXTRACTION Right 2015  . CYSTOSCOPY W/ STONE MANIPULATION  1970s  . EP IMPLANTABLE DEVICE N/A 10/10/2015   Procedure: Pacemaker Implant;  Surgeon: Marinus Maw, MD;  Location: [facility] INVASIVE CV LAB;  Service: Cardiovascular;  Laterality: N/A;  . INSERT / REPLACE / REMOVE PACEMAKER  10/10/2015    Family History  Problem Relation Age of Onset  . Hypertension Father   . Diabetes Mother     Social History:  reports that he has quit smoking. His smoking use included pipe and cigars. He quit after 30.00 years of use. He has quit using smokeless tobacco.  His smokeless tobacco use included chew. He reports that he does not drink alcohol and does not use drugs.  Allergies:  Allergies  Allergen Reactions  . Atenolol Nausea Only and Other (See Comments)    Dizzy, headache  . Dilacor Xr [Diltiazem Hcl] Other (See Comments)  NOT EFFECTIVE  . Isoptin Sr [Verapamil Hcl Er] Other (See Comments)    Erectile dysfunction  . Lopressor [Metoprolol Tartrate] Nausea Only and Other (See Comments)    dizzy, headache  . Norvasc [Amlodipine Besylate] Other (See Comments)    Headache and erectile dysfunction    Medications:  I have reviewed the patient's current medications. Scheduled: . acetaminophen  1,000 mg Oral Q8H  . aspirin  325 mg Oral Daily  . chlorhexidine  60 mL Topical Once  . [START ON 03/03/2020] cholecalciferol  1,000 Units Oral Daily  . heparin  5,000 Units Subcutaneous Q8H  . insulin aspart  0-9 Units Subcutaneous TID WC  . magnesium chloride  1 tablet Oral BID  .  povidone-iodine  2 application Topical Once    Results for orders placed or performed during the hospital encounter of 03/02/20 (from the past 24 hour(s))  Basic metabolic panel     Status: Abnormal   Collection Time: 03/02/20  2:10 AM  Result Value Ref Range   Sodium 136 135 - 145 mmol/L   Potassium 3.4 (L) 3.5 - 5.1 mmol/L   Chloride 98 98 - 111 mmol/L   CO2 24 22 - 32 mmol/L   Glucose, Bld 126 (H) 70 - 99 mg/dL   BUN 49 (H) 8 - 23 mg/dL   Creatinine, Ser 1.612.17 (H) 0.61 - 1.24 mg/dL   Calcium 8.3 (L) 8.9 - 10.3 mg/dL   GFR, Estimated 28 (L) >60 mL/min   Anion gap 14 5 - 15  CBC WITH DIFFERENTIAL     Status: Abnormal   Collection Time: 03/02/20  2:10 AM  Result Value Ref Range   WBC 9.3 4.0 - 10.5 K/uL   RBC 4.44 4.22 - 5.81 MIL/uL   Hemoglobin 14.5 13.0 - 17.0 g/dL   HCT 09.642.1 39 - 52 %   MCV 94.8 80.0 - 100.0 fL   MCH 32.7 26.0 - 34.0 pg   MCHC 34.4 30.0 - 36.0 g/dL   RDW 04.512.5 40.911.5 - 81.115.5 %   Platelets 175 150 - 400 K/uL   nRBC 0.0 0.0 - 0.2 %   Neutrophils Relative % 88 %   Neutro Abs 8.2 (H) 1.7 - 7.7 K/uL   Lymphocytes Relative 5 %   Lymphs Abs 0.5 (L) 0.7 - 4.0 K/uL   Monocytes Relative 6 %   Monocytes Absolute 0.6 0.1 - 1.0 K/uL   Eosinophils Relative 1 %   Eosinophils Absolute 0.1 0.0 - 0.5 K/uL   Basophils Relative 0 %   Basophils Absolute 0.0 0.0 - 0.1 K/uL   Immature Granulocytes 0 %   Abs Immature Granulocytes 0.03 0.00 - 0.07 K/uL  Protime-INR     Status: None   Collection Time: 03/02/20  2:10 AM  Result Value Ref Range   Prothrombin Time 13.9 11.4 - 15.2 seconds   INR 1.1 0.8 - 1.2  Type and screen [facility]     Status: None   Collection Time: 03/02/20  2:10 AM  Result Value Ref Range   ABO/RH(D) A NEG    Antibody Screen NEG    Sample Expiration      03/05/2020,2359 Performed at [facility], [address] [place], [place] [address]   Resp Panel by RT-PCR (Flu A&B, Covid) Nasopharyngeal Swab     Status:  Abnormal   Collection Time: 03/02/20  2:10 AM   Specimen: Nasopharyngeal Swab; Nasopharyngeal(NP) swabs in vial transport medium  Result Value Ref Range  SARS Coronavirus 2 by RT PCR POSITIVE (A) NEGATIVE   Influenza A by PCR NEGATIVE NEGATIVE   Influenza B by PCR NEGATIVE NEGATIVE  VITAMIN D 25 Hydroxy (Vit-D Deficiency, Fractures)     Status: None   Collection Time: 03/02/20  7:46 AM  Result Value Ref Range   Vit D, 25-Hydroxy 33.66 30 - 100 ng/mL  Magnesium     Status: Abnormal   Collection Time: 03/02/20  7:59 AM  Result Value Ref Range   Magnesium 1.6 (L) 1.7 - 2.4 mg/dL  Hemoglobin H8E     Status: Abnormal   Collection Time: 03/02/20  8:03 AM  Result Value Ref Range   Hgb A1c MFr Bld 6.4 (H) 4.8 - 5.6 %   Mean Plasma Glucose 136.98 mg/dL  Glucose, capillary     Status: Abnormal   Collection Time: 03/02/20  4:34 PM  Result Value Ref Range   Glucose-Capillary 175 (H) 70 - 99 mg/dL     X-ray: CLINICAL DATA:  Fall  EXAM: DG HIP (WITH OR WITHOUT PELVIS) 2-3V RIGHT  COMPARISON:  None.  FINDINGS: There is a comminuted intertrochanteric fracture of the right femur. No dislocation. Bony pelvis is otherwise intact.  IMPRESSION: Comminuted intertrochanteric fracture of the right femur.   Electronically Signed   By: Deatra Robinson M.D.  ROS: Review of Systems  Constitutional: Positive for malaise/fatigue (with covid). Negative for chills, fever and weight loss.  HENT: Positive for congestion and hearing loss.   Eyes: Negative for blurred vision, double vision and pain.  Respiratory: Positive for cough and shortness of breath (with covid).   Cardiovascular: Negative for chest pain, palpitations, orthopnea, leg swelling and PND.  Gastrointestinal: Negative for diarrhea, nausea and vomiting.  Genitourinary: Negative for dysuria, frequency and urgency.  Musculoskeletal: Positive for joint pain. Negative for neck pain.  Skin: Negative for rash.  Neurological:  Negative for dizziness, tingling, weakness and headaches.  Psychiatric/Behavioral: Negative for depression and suicidal ideas.    Blood pressure (!) 157/81, pulse 87, temperature 98.1 F (36.7 C), resp. rate 16, SpO2 96 %.  Physical Exam: BP 137/69   Pulse 84   Temp 99.3 F (37.4 C) (Oral)   Resp 20   SpO2 93%  General appearance: Well-developed, adult male, alert and in no distress .  Eyes: Anicteric, conjunctiva pink, lids and lashes normal. PERRL.    ENT: No nasal deformity, discharge, epistaxis.  Hearing impaired. OP moist without lesions.  Dentition poor, no teeth.    Lymph: No cervical, supraclavicular or axillary lymphadenopathy. Skin: Warm and dry.  No jaundice.  No suspicious rashes or lesions. Cardiac: RRR, nl S1-S2, no murmurs appreciated. Capillary refill is brisk.  JVP normalnot visible.  No LE edema.  Radial and  pulses 2+ and symmetric.  No carotid bruits. Respiratory: Normal respiratory rate and rhythm.  CTAB without rales or wheezes. GI: Abdomen soft without rigidity.  no TTP.No ascites, distension, no hepatosplenomegaly.  MSK: Right leg foreshortened and externally rotated.  No effusions.  No clubbing or cyanosis. Neuro: Sensorium intact and responding to questions, attention normal. Speech is fluent.  Moves all extremities equally and with normal coordination.    Psych: The patient is oriented to time, place and person. Behavior appropriate.  Affect is okay.  Memory is not intact    Assessment/Plan: 1. Right comminuted intertrochanteric hip fracture  Plan: Admit to Hospitalist service for pre-operative optimization Plan for ORIF tomorrow with Dr. Linna Caprice NPO after mn tonight Timing of surgery depending on OR  schedule  Shelda Pal 03/02/2020, 5:11 PM

## 2020-03-03 ENCOUNTER — Encounter (HOSPITAL_COMMUNITY)
Admission: EM | Disposition: A | Discharge status: Skilled Nursing Facility | Payer: Self-pay | Source: Home / Self Care | Attending: Internal Medicine

## 2020-03-03 ENCOUNTER — Inpatient Hospital Stay (HOSPITAL_COMMUNITY): Payer: Medicare HMO

## 2020-03-03 ENCOUNTER — Inpatient Hospital Stay (HOSPITAL_COMMUNITY): Payer: Medicare HMO | Admitting: Anesthesiology

## 2020-03-03 DIAGNOSIS — W19XXXA Unspecified fall, initial encounter: Secondary | ICD-10-CM

## 2020-03-03 DIAGNOSIS — E1122 Type 2 diabetes mellitus with diabetic chronic kidney disease: Secondary | ICD-10-CM | POA: Diagnosis not present

## 2020-03-03 DIAGNOSIS — I10 Essential (primary) hypertension: Secondary | ICD-10-CM

## 2020-03-03 DIAGNOSIS — I129 Hypertensive chronic kidney disease with stage 1 through stage 4 chronic kidney disease, or unspecified chronic kidney disease: Secondary | ICD-10-CM | POA: Diagnosis not present

## 2020-03-03 DIAGNOSIS — S72101A Unspecified trochanteric fracture of right femur, initial encounter for closed fracture: Secondary | ICD-10-CM | POA: Diagnosis not present

## 2020-03-03 DIAGNOSIS — Z794 Long term (current) use of insulin: Secondary | ICD-10-CM | POA: Diagnosis not present

## 2020-03-03 DIAGNOSIS — Z7984 Long term (current) use of oral hypoglycemic drugs: Secondary | ICD-10-CM | POA: Diagnosis not present

## 2020-03-03 DIAGNOSIS — N183 Chronic kidney disease, stage 3 unspecified: Secondary | ICD-10-CM | POA: Diagnosis not present

## 2020-03-03 HISTORY — PX: INTRAMEDULLARY (IM) NAIL INTERTROCHANTERIC: SHX5875

## 2020-03-03 LAB — BASIC METABOLIC PANEL
Anion gap: 14 (ref 5–15)
BUN: 50 mg/dL — ABNORMAL HIGH (ref 8–23)
CO2: 24 mmol/L (ref 22–32)
Calcium: 8.4 mg/dL — ABNORMAL LOW (ref 8.9–10.3)
Chloride: 98 mmol/L (ref 98–111)
Creatinine, Ser: 2.06 mg/dL — ABNORMAL HIGH (ref 0.61–1.24)
GFR, Estimated: 30 mL/min — ABNORMAL LOW (ref >60)
Glucose, Bld: 127 mg/dL — ABNORMAL HIGH (ref 70–99)
Potassium: 3.8 mmol/L (ref 3.5–5.1)
Sodium: 136 mmol/L (ref 135–145)

## 2020-03-03 LAB — CBC
HCT: 40.1 % (ref 39.0–52.0)
Hemoglobin: 13.6 g/dL (ref 13.0–17.0)
MCH: 32.5 pg (ref 26.0–34.0)
MCHC: 33.9 g/dL (ref 30.0–36.0)
MCV: 95.7 fL (ref 80.0–100.0)
Platelets: 181 10*3/uL (ref 150–400)
RBC: 4.19 MIL/uL — ABNORMAL LOW (ref 4.22–5.81)
RDW: 12.5 % (ref 11.5–15.5)
WBC: 10.6 10*3/uL — ABNORMAL HIGH (ref 4.0–10.5)
nRBC: 0 % (ref 0.0–0.2)

## 2020-03-03 LAB — SURGICAL PCR SCREEN
MRSA, PCR: NEGATIVE
Staphylococcus aureus: NEGATIVE

## 2020-03-03 LAB — ABO/RH: ABO/RH(D): A NEG

## 2020-03-03 LAB — GLUCOSE, CAPILLARY
Glucose-Capillary: 131 mg/dL — ABNORMAL HIGH (ref 70–99)
Glucose-Capillary: 126 mg/dL — ABNORMAL HIGH (ref 70–99)
Glucose-Capillary: 230 mg/dL — ABNORMAL HIGH (ref 70–99)
Glucose-Capillary: 184 mg/dL — ABNORMAL HIGH (ref 70–99)

## 2020-03-03 SURGERY — FIXATION, FRACTURE, INTERTROCHANTERIC, WITH INTRAMEDULLARY ROD
Anesthesia: Monitor Anesthesia Care | Laterality: Right

## 2020-03-03 MED ORDER — METOCLOPRAMIDE HCL 5 MG/ML IJ SOLN: 5.0000 mg | Freq: Three times a day (TID) | INTRAMUSCULAR | Status: DC | PRN

## 2020-03-03 MED ORDER — TRANEXAMIC ACID-NACL 1000-0.7 MG/100ML-% IV SOLN
1000.0000 mg | INTRAVENOUS | Status: AC
  Administered 2020-03-03: 1000 mg via INTRAVENOUS
  Filled 2020-03-03: qty 100

## 2020-03-03 MED ORDER — ONDANSETRON HCL 4 MG/2ML IJ SOLN
INTRAMUSCULAR | Status: DC | PRN
  Administered 2020-03-03: 4 mg via INTRAVENOUS

## 2020-03-03 MED ORDER — DARIFENACIN HYDROBROMIDE ER 15 MG PO TB24
15.0000 mg | ORAL_TABLET | Freq: Every day | ORAL | Status: DC
  Administered 2020-03-04 – 2020-03-11 (×8): 15 mg via ORAL
  Filled 2020-03-03 (×8): qty 1

## 2020-03-03 MED ORDER — CEFAZOLIN SODIUM-DEXTROSE 2-4 GM/100ML-% IV SOLN
2.0000 g | INTRAVENOUS | Status: AC
  Administered 2020-03-03: 2 g via INTRAVENOUS
  Filled 2020-03-03 (×2): qty 100

## 2020-03-03 MED ORDER — ALLOPURINOL 300 MG PO TABS
300.0000 mg | ORAL_TABLET | Freq: Every day | ORAL | Status: DC
  Administered 2020-03-03 – 2020-03-11 (×9): 300 mg via ORAL
  Filled 2020-03-03 (×9): qty 1

## 2020-03-03 MED ORDER — DONEPEZIL HCL 10 MG PO TABS
10.0000 mg | ORAL_TABLET | Freq: Every day | ORAL | Status: DC
  Administered 2020-03-04 – 2020-03-11 (×8): 10 mg via ORAL
  Filled 2020-03-03 (×8): qty 1

## 2020-03-03 MED ORDER — PROPOFOL 500 MG/50ML IV EMUL
INTRAVENOUS | Status: AC
  Filled 2020-03-03: qty 50

## 2020-03-03 MED ORDER — LACTATED RINGERS IV SOLN: INTRAVENOUS | Status: DC | PRN

## 2020-03-03 MED ORDER — MENTHOL 3 MG MT LOZG: 1.0000 | LOZENGE | OROMUCOSAL | Status: DC | PRN

## 2020-03-03 MED ORDER — LACTATED RINGERS IV SOLN: INTRAVENOUS | Status: DC

## 2020-03-03 MED ORDER — CELECOXIB 200 MG PO CAPS
200.0000 mg | ORAL_CAPSULE | Freq: Once | ORAL | Status: AC
  Administered 2020-03-03: 200 mg via ORAL
  Filled 2020-03-03: qty 1

## 2020-03-03 MED ORDER — CHLORHEXIDINE GLUCONATE 4 % EX LIQD
60.0000 mL | Freq: Once | CUTANEOUS | Status: AC
  Administered 2020-03-03: 4 via TOPICAL
  Filled 2020-03-03: qty 60

## 2020-03-03 MED ORDER — PHENOL 1.4 % MT LIQD
1.0000 | OROMUCOSAL | Status: DC | PRN
  Filled 2020-03-03: qty 177

## 2020-03-03 MED ORDER — PROPOFOL 10 MG/ML IV BOLUS
INTRAVENOUS | Status: DC | PRN
  Administered 2020-03-03: 20 mg via INTRAVENOUS
  Administered 2020-03-03: 10 mg via INTRAVENOUS
  Administered 2020-03-03 (×3): 20 mg via INTRAVENOUS

## 2020-03-03 MED ORDER — FENTANYL CITRATE (PF) 100 MCG/2ML IJ SOLN
INTRAMUSCULAR | Status: AC
  Filled 2020-03-03: qty 2

## 2020-03-03 MED ORDER — EPHEDRINE SULFATE-NACL 50-0.9 MG/10ML-% IV SOSY
PREFILLED_SYRINGE | INTRAVENOUS | Status: DC | PRN
  Administered 2020-03-03 (×2): 5 mg via INTRAVENOUS

## 2020-03-03 MED ORDER — PROPOFOL 500 MG/50ML IV EMUL
INTRAVENOUS | Status: DC | PRN
  Administered 2020-03-03: 25 ug/kg/min via INTRAVENOUS

## 2020-03-03 MED ORDER — PHENYLEPHRINE HCL-NACL 10-0.9 MG/250ML-% IV SOLN
INTRAVENOUS | Status: DC | PRN
  Administered 2020-03-03: 50 ug/min via INTRAVENOUS

## 2020-03-03 MED ORDER — PHENYLEPHRINE HCL (PRESSORS) 10 MG/ML IV SOLN
INTRAVENOUS | Status: AC
  Filled 2020-03-03: qty 1

## 2020-03-03 MED ORDER — DOCUSATE SODIUM 100 MG PO CAPS
100.0000 mg | ORAL_CAPSULE | Freq: Two times a day (BID) | ORAL | Status: DC
  Administered 2020-03-03: 100 mg via ORAL
  Filled 2020-03-03: qty 1

## 2020-03-03 MED ORDER — ENSURE PRE-SURGERY PO LIQD
296.0000 mL | Freq: Once | ORAL | Status: AC
  Administered 2020-03-03: 296 mL via ORAL
  Filled 2020-03-03 (×2): qty 296

## 2020-03-03 MED ORDER — PHENYLEPHRINE HCL-NACL 10-0.9 MG/250ML-% IV SOLN: 25.0000 ug/min | INTRAVENOUS | Status: DC

## 2020-03-03 MED ORDER — SIMVASTATIN 10 MG PO TABS
10.0000 mg | ORAL_TABLET | Freq: Every day | ORAL | Status: DC
  Administered 2020-03-03 – 2020-03-11 (×9): 10 mg via ORAL
  Filled 2020-03-03 (×9): qty 1

## 2020-03-03 MED ORDER — OXYBUTYNIN CHLORIDE 5 MG PO TABS
5.0000 mg | ORAL_TABLET | Freq: Two times a day (BID) | ORAL | Status: DC
  Administered 2020-03-03 – 2020-03-11 (×17): 5 mg via ORAL
  Filled 2020-03-03 (×17): qty 1

## 2020-03-03 MED ORDER — ONDANSETRON HCL 4 MG PO TABS: 4.0000 mg | ORAL_TABLET | Freq: Four times a day (QID) | ORAL | Status: DC | PRN

## 2020-03-03 MED ORDER — FENTANYL CITRATE (PF) 100 MCG/2ML IJ SOLN
INTRAMUSCULAR | Status: DC | PRN
  Administered 2020-03-03: 50 ug via INTRAVENOUS

## 2020-03-03 MED ORDER — PANTOPRAZOLE SODIUM 40 MG PO TBEC
80.0000 mg | DELAYED_RELEASE_TABLET | Freq: Every day | ORAL | Status: DC
  Administered 2020-03-03 – 2020-03-11 (×9): 80 mg via ORAL
  Filled 2020-03-03 (×10): qty 2

## 2020-03-03 MED ORDER — ONDANSETRON HCL 4 MG/2ML IJ SOLN: 4.0000 mg | Freq: Four times a day (QID) | INTRAMUSCULAR | Status: DC | PRN

## 2020-03-03 MED ORDER — DULOXETINE HCL 60 MG PO CPEP
60.0000 mg | ORAL_CAPSULE | Freq: Every day | ORAL | Status: DC
  Administered 2020-03-04 – 2020-03-11 (×8): 60 mg via ORAL
  Filled 2020-03-03 (×4): qty 1
  Filled 2020-03-03: qty 2
  Filled 2020-03-03 (×3): qty 1

## 2020-03-03 MED ORDER — ENOXAPARIN SODIUM 40 MG/0.4ML ~~LOC~~ SOLN: 40.0000 mg | SUBCUTANEOUS | Status: DC

## 2020-03-03 MED ORDER — METOCLOPRAMIDE HCL 5 MG PO TABS: 5.0000 mg | ORAL_TABLET | Freq: Three times a day (TID) | ORAL | Status: DC | PRN

## 2020-03-03 MED ORDER — BUPIVACAINE IN DEXTROSE 0.75-8.25 % IT SOLN
INTRATHECAL | Status: DC | PRN
  Administered 2020-03-03: 1.8 mL via INTRATHECAL

## 2020-03-03 MED ORDER — POVIDONE-IODINE 10 % EX SWAB
2.0000 "application " | Freq: Once | CUTANEOUS | Status: AC
  Administered 2020-03-03: 2 via TOPICAL

## 2020-03-03 MED ORDER — ACETAMINOPHEN 500 MG PO TABS
1000.0000 mg | ORAL_TABLET | Freq: Once | ORAL | Status: AC
  Administered 2020-03-03: 1000 mg via ORAL
  Filled 2020-03-03: qty 2

## 2020-03-03 MED ORDER — LACTATED RINGERS IV SOLN
INTRAVENOUS | Status: DC
  Administered 2020-03-03: 1 mL via INTRAVENOUS

## 2020-03-03 MED ORDER — NAPHAZOLINE-GLYCERIN 0.012-0.2 % OP SOLN
1.0000 [drp] | Freq: Four times a day (QID) | OPHTHALMIC | Status: DC | PRN
  Administered 2020-03-04 – 2020-03-07 (×3): 1 [drp] via OPHTHALMIC
  Filled 2020-03-03: qty 15

## 2020-03-03 MED ORDER — CEFAZOLIN SODIUM-DEXTROSE 2-4 GM/100ML-% IV SOLN
2.0000 g | Freq: Four times a day (QID) | INTRAVENOUS | Status: AC
  Administered 2020-03-03 – 2020-03-04 (×2): 2 g via INTRAVENOUS
  Filled 2020-03-03 (×2): qty 100

## 2020-03-03 MED ORDER — PHENYLEPHRINE HCL-NACL 10-0.9 MG/250ML-% IV SOLN: 0.0000 ug/min | INTRAVENOUS | Status: DC

## 2020-03-03 SURGICAL SUPPLY — 50 items
ADH SKN CLS APL DERMABOND .7 (GAUZE/BANDAGES/DRESSINGS) ×2
APL PRP STRL LF DISP 70% ISPRP (MISCELLANEOUS) ×1
BAG SPEC THK2 15X12 ZIP CLS (MISCELLANEOUS)
BAG ZIPLOCK 12X15 (MISCELLANEOUS) IMPLANT
BIT DRILL 4.3MMS DISTAL GRDTED (BIT) IMPLANT
CHLORAPREP W/TINT 26 (MISCELLANEOUS) ×2 IMPLANT
CORTICAL BONE SCR 5.0MM X 46MM (Screw) ×2 IMPLANT
COVER PERINEAL POST (MISCELLANEOUS) ×2 IMPLANT
COVER SURGICAL LIGHT HANDLE (MISCELLANEOUS) ×2 IMPLANT
COVER WAND RF STERILE (DRAPES) IMPLANT
DERMABOND ADVANCED (GAUZE/BANDAGES/DRESSINGS) ×2
DERMABOND ADVANCED .7 DNX12 (GAUZE/BANDAGES/DRESSINGS) ×2 IMPLANT
DRAPE C-ARM 42X120 X-RAY (DRAPES) ×2 IMPLANT
DRAPE C-ARMOR (DRAPES) ×2 IMPLANT
DRAPE IMP U-DRAPE 54X76 (DRAPES) ×4 IMPLANT
DRAPE SHEET LG 3/4 BI-LAMINATE (DRAPES) ×4 IMPLANT
DRAPE STERI IOBAN 125X83 (DRAPES) ×2 IMPLANT
DRAPE U-SHAPE 47X51 STRL (DRAPES) ×4 IMPLANT
DRILL 4.3MMS DISTAL GRADUATED (BIT) ×2
DRSG MEPILEX BORDER 4X4 (GAUZE/BANDAGES/DRESSINGS) ×4 IMPLANT
DRSG MEPILEX BORDER 4X8 (GAUZE/BANDAGES/DRESSINGS) IMPLANT
ELECT BLADE TIP CTD 4 INCH (ELECTRODE) IMPLANT
FACESHIELD WRAPAROUND (MASK) ×4 IMPLANT
FACESHIELD WRAPAROUND OR TEAM (MASK) ×2 IMPLANT
GAUZE SPONGE 4X4 12PLY STRL (GAUZE/BANDAGES/DRESSINGS) ×2 IMPLANT
GLOVE BIO SURGEON STRL SZ8.5 (GLOVE) ×4 IMPLANT
GLOVE BIOGEL M STRL SZ7.5 (GLOVE) ×4 IMPLANT
GLOVE BIOGEL PI IND STRL 8 (GLOVE) ×1 IMPLANT
GLOVE BIOGEL PI IND STRL 8.5 (GLOVE) ×1 IMPLANT
GLOVE BIOGEL PI INDICATOR 8 (GLOVE) ×1
GLOVE BIOGEL PI INDICATOR 8.5 (GLOVE) ×1
GOWN SPEC L3 XXLG W/TWL (GOWN DISPOSABLE) ×2 IMPLANT
GUIDEPIN 3.2X17.5 THRD DISP (PIN) ×2 IMPLANT
GUIDEWIRE BALL NOSE 100CM (WIRE) ×1 IMPLANT
HIP FRAC NAIL LAG SCR 10.5X100 (Orthopedic Implant) ×2 IMPLANT
KIT BASIN OR (CUSTOM PROCEDURE TRAY) ×2 IMPLANT
KIT TURNOVER KIT A (KITS) IMPLANT
MANIFOLD NEPTUNE II (INSTRUMENTS) ×2 IMPLANT
MARKER SKIN DUAL TIP RULER LAB (MISCELLANEOUS) ×2 IMPLANT
NAIL IM ANG AFFIXUS 11X380 RT (Nail) ×1 IMPLANT
PACK GENERAL/GYN (CUSTOM PROCEDURE TRAY) ×2 IMPLANT
PENCIL SMOKE EVACUATOR (MISCELLANEOUS) IMPLANT
SCREW CANN THRD AFF 10.5X100 (Orthopedic Implant) IMPLANT
SCREW CORTICL BON 5.0MM X 46MM (Screw) IMPLANT
SUT MNCRL AB 3-0 PS2 18 (SUTURE) ×2 IMPLANT
SUT MON AB 2-0 CT1 36 (SUTURE) ×1 IMPLANT
SUT VIC AB 1 CT1 36 (SUTURE) ×2 IMPLANT
SUT VIC AB 2-0 CT1 27 (SUTURE) ×2
SUT VIC AB 2-0 CT1 TAPERPNT 27 (SUTURE) IMPLANT
TOWEL OR 17X26 10 PK STRL BLUE (TOWEL DISPOSABLE) ×2 IMPLANT

## 2020-03-03 NOTE — Anesthesia Procedure Notes (Signed)
Spinal  Patient location during procedure: OR Start time: 03/03/2020 5:52 PM End time: 03/03/2020 6:02 PM Staffing Performed: anesthesiologist  Anesthesiologist: Gaynelle Adu, MD Preanesthetic Checklist Completed: patient identified, IV checked, risks and benefits discussed, surgical consent, monitors and equipment checked, pre-op evaluation and timeout performed Spinal Block Patient position: left lateral decubitus Prep: DuraPrep Patient monitoring: cardiac monitor, continuous pulse ox and blood pressure Approach: left paramedian Location: L3-4 Injection technique: single-shot Needle Needle type: Quincke  Needle gauge: 22 G Needle length: 9 cm Assessment Sensory level: T8 Additional Notes Functioning IV was confirmed and monitors were applied. Sterile prep and drape, including hand hygiene and sterile gloves were used. The patient was positioned and the spine was prepped. The skin was anesthetized with lidocaine.  Free flow of clear CSF was obtained prior to injecting local anesthetic into the CSF.  The spinal needle aspirated freely following injection.  The needle was carefully withdrawn.  The patient tolerated the procedure well.

## 2020-03-03 NOTE — Discharge Instructions (Signed)
 Dr. Vallorie Niccoli Adult Hip & Knee Specialist [facility] [address] [place], [place] [address] [phone]   POSTOPERATIVE DIRECTIONS    Hip Rehabilitation, Guidelines Following Surgery   WEIGHT BEARING Weight bearing as tolerated with assist device (walker, cane, etc) as directed, use it as long as suggested by your surgeon or therapist, typically at least 4-6 weeks.   HOME CARE INSTRUCTIONS  Remove items at home which could result in a fall. This includes throw rugs or furniture in walking pathways.  Continue medications as instructed at time of discharge.  You may have some home medications which will be placed on hold until you complete the course of blood thinner medication.  4 days after discharge, you may start showering. No tub baths or soaking your incisions. Do not put on socks or shoes without following the instructions of your caregivers.   Sit on chairs with arms. Use the chair arms to help push yourself up when arising.  Arrange for the use of a toilet seat elevator so you are not sitting low.   Walk with walker as instructed.  You may resume a sexual relationship in one month or when given the OK by your caregiver.  Use walker as long as suggested by your caregivers.  Avoid periods of inactivity such as sitting longer than an hour when not asleep. This helps prevent blood clots.  You may return to work once you are cleared by your surgeon.  Do not drive a car for 6 weeks or until released by your surgeon.  Do not drive while taking narcotics.  Wear elastic stockings for two weeks following surgery during the day but you may remove then at night.  Make sure you keep all of your appointments after your operation with all of your doctors and caregivers. You should call the office at the above phone number and make an appointment for approximately two weeks after the date of your surgery. Please pick up a stool softener and laxative  for home use as long as you are requiring pain medications.  ICE to the affected hip every three hours for 30 minutes at a time and then as needed for pain and swelling. Continue to use ice on the hip for pain and swelling from surgery. You may notice swelling that will progress down to the foot and ankle.  This is normal after surgery.  Elevate the leg when you are not up walking on it.   It is important for you to complete the blood thinner medication as prescribed by your doctor.  Continue to use the breathing machine which will help keep your temperature down.  It is common for your temperature to cycle up and down following surgery, especially at night when you are not up moving around and exerting yourself.  The breathing machine keeps your lungs expanded and your temperature down.  RANGE OF MOTION AND STRENGTHENING EXERCISES  These exercises are designed to help you keep full movement of your hip joint. Follow your caregiver's or physical therapist's instructions. Perform all exercises about fifteen times, three times per day or as directed. Exercise both hips, even if you have had only one joint replacement. These exercises can be done on a training (exercise) mat, on the floor, on a table or on a bed. Use whatever works the best and is most comfortable for you. Use music or television while you are exercising so that the exercises are a pleasant break in your day. This   will make your life better with the exercises acting as a break in routine you can look forward to.  Lying on your back, slowly slide your foot toward your buttocks, raising your knee up off the floor. Then slowly slide your foot back down until your leg is straight again.  Lying on your back spread your legs as far apart as you can without causing discomfort.  Lying on your side, raise your upper leg and foot straight up from the floor as far as is comfortable. Slowly lower the leg and repeat.  Lying on your back, tighten up the  muscle in the front of your thigh (quadriceps muscles). You can do this by keeping your leg straight and trying to raise your heel off the floor. This helps strengthen the largest muscle supporting your knee.  Lying on your back, tighten up the muscles of your buttocks both with the legs straight and with the knee bent at a comfortable angle while keeping your heel on the floor.   SKILLED REHAB INSTRUCTIONS: If the patient is transferred to a skilled rehab facility following release from the hospital, a list of the current medications will be sent to the facility for the patient to continue.  When discharged from the skilled rehab facility, please have the facility set up the patient's Home Health Physical Therapy prior to being released. Also, the skilled facility will be responsible for providing the patient with their medications at time of release from the facility to include their pain medication and their blood thinner medication. If the patient is still at the rehab facility at time of the two week follow up appointment, the skilled rehab facility will also need to assist the patient in arranging follow up appointment in our office and any transportation needs.  MAKE SURE YOU:  Understand these instructions.  Will watch your condition.  Will get help right away if you are not doing well or get worse.  Pick up stool softner and laxative for home use following surgery while on pain medications. Daily dry dressing changes as needed. In 4 days, you may remove your dressings and begin taking showers - no tub baths or soaking the incisions. Continue to use ice for pain and swelling after surgery. Do not use any lotions or creams on the incision until instructed by your surgeon.   

## 2020-03-03 NOTE — Consult Note (Signed)
ORTHOPAEDIC CONSULTATION  REQUESTING PHYSICIAN: Rolly Salter, MD  PCP:  Deatra James, MD  Chief Complaint: Comminuted right intertrochanteric femur fracture.  HPI: Noah Adkins is a 86 y.o. male with a past medical history of chronic kidney disease, diabetes, hypertension, complete heart block/sick sinus syndrome status post pacemaker who follows with Dr. Ladona Ridgel, coronary artery disease, right bundle branch block, and history of TIA who presented to the emergency department after a mechanical fall onto his right hip.  He slipped on the wet floor of the bathroom, and injured his right hip.  He was unable to ambulate.  He denies any other injuries.  He was brought to the emergency department, where x-rays revealed a comminuted right intertrochanteric femur fracture.  Covid test was positive.  He was admitted by the hospitalist for perioperative risk ratification and optimization.  He has received subcutaneous heparin.  Past Medical History:  Diagnosis Date  . Arthritis    "knees, hands" (10/10/2015)  . History of blood transfusion 1970s   "related to work related injury; broke rib; lost blood"  . History of gout   . Hypertension   . Kidney stones   . Presence of permanent cardiac pacemaker   . Type II diabetes mellitus (HCC)    Past Surgical History:  Procedure Laterality Date  . CATARACT EXTRACTION Right 2015  . CYSTOSCOPY W/ STONE MANIPULATION  1970s  . EP IMPLANTABLE DEVICE N/A 10/10/2015   Procedure: Pacemaker Implant;  Surgeon: Marinus Maw, MD;  Location: [facility] INVASIVE CV LAB;  Service: Cardiovascular;  Laterality: N/A;  . INSERT / REPLACE / REMOVE PACEMAKER  10/10/2015   Social History   Socioeconomic History  . Marital status: Married    Spouse name: Not on file  . Number of children: Not on file  . Years of education: Not on file  . Highest education level: Not on file  Occupational History  . Not on file  Tobacco Use  . Smoking status: Former Smoker    Years:  30.00    Types: Pipe, Cigars  . Smokeless tobacco: Former Neurosurgeon    Types: Chew  . Tobacco comment: 10/10/2015 "quit smoking cigarettes & chewing tobacco in the 1980s"  Vaping Use  . Vaping Use: Never used  Substance and Sexual Activity  . Alcohol use: No    Alcohol/week: 0.0 standard drinks    Comment: 10/10/2015 "last year I had 2 beers"  . Drug use: No  . Sexual activity: Not Currently  Other Topics Concern  . Not on file  Social History Narrative  . Not on file   Social Determinants of Health   Financial Resource Strain:   . Difficulty of Paying Living Expenses: Not on file  Food Insecurity:   . Worried About Programme researcher, broadcasting/film/video in the Last Year: Not on file  . Ran Out of Food in the Last Year: Not on file  Transportation Needs:   . Lack of Transportation (Medical): Not on file  . Lack of Transportation (Non-Medical): Not on file  Physical Activity:   . Days of Exercise per Week: Not on file  . Minutes of Exercise per Session: Not on file  Stress:   . Feeling of Stress : Not on file  Social Connections:   . Frequency of Communication with Friends and Family: Not on file  . Frequency of Social Gatherings with Friends and Family: Not on file  . Attends Religious Services: Not on file  . Active Member of Clubs  or Organizations: Not on file  . Attends Banker Meetings: Not on file  . Marital Status: Not on file   Family History  Problem Relation Age of Onset  . Hypertension Father   . Diabetes Mother    Allergies  Allergen Reactions  . Atenolol Nausea Only and Other (See Comments)    Dizzy, headache  . Dilacor Xr [Diltiazem Hcl] Other (See Comments)    NOT EFFECTIVE  . Isoptin Sr [Verapamil Hcl Er] Other (See Comments)    Erectile dysfunction  . Lopressor [Metoprolol Tartrate] Nausea Only and Other (See Comments)    dizzy, headache  . Norvasc [Amlodipine Besylate] Other (See Comments)    Headache and erectile dysfunction   Prior to Admission  medications   Medication Sig Start Date End Date Taking? Authorizing Provider  allopurinol (ZYLOPRIM) 300 MG tablet Take 300 mg by mouth daily.  09/30/13  Yes [provider]  aspirin EC 325 MG EC tablet Take 1 tablet (325 mg total) by mouth daily. 04/12/17  Yes Randel Pigg, Dorma Russell, MD  diphenhydrAMINE (BENADRYL) 25 mg capsule Take 25 mg by mouth every 6 (six) hours as needed for itching, allergies or sleep.   Yes [provider]  donepezil (ARICEPT) 10 MG tablet Take 10 mg by mouth daily. 11/19/19  Yes [provider]  DULoxetine (CYMBALTA) 60 MG capsule Take 60 mg by mouth daily.  01/24/20  Yes [provider]  fluticasone (FLONASE) 50 MCG/ACT nasal spray Place 1 spray into both nostrils daily.  09/17/13  Yes [provider]  glipiZIDE (GLUCOTROL XL) 2.5 MG 24 hr tablet Take 2.5 mg by mouth daily. 10/17/17  Yes [provider]  guaiFENesin-dextromethorphan (ROBITUSSIN DM) 100-10 MG/5ML syrup Take 5 mLs by mouth every 4 (four) hours as needed for cough.   Yes [provider]  ibuprofen (ADVIL,MOTRIN) 200 MG tablet Take 200-400 mg by mouth every 6 (six) hours as needed for fever, headache, mild pain, moderate pain or cramping.   Yes [provider]  lisinopril-hydrochlorothiazide (PRINZIDE,ZESTORETIC) 20-25 MG per tablet Take 1 tablet by mouth daily.  09/17/13  Yes [provider]  Melatonin 3 MG CAPS Take 3 mg by mouth at bedtime.   Yes [provider]  omeprazole (PRILOSEC) 40 MG capsule Take 40 mg by mouth daily. 09/20/17  Yes [provider]  oxybutynin (DITROPAN) 5 MG tablet Take 5 mg by mouth 2 (two) times daily. 01/26/17  Yes [provider]  oxyCODONE-acetaminophen (PERCOCET/ROXICET) 5-325 MG tablet Take 1 tablet by mouth every 6 (six) hours as needed (pain).  09/13/15  Yes [provider]  simvastatin (ZOCOR) 10 MG tablet Take 10 mg by mouth at bedtime.  02/25/17  Yes [provider]  solifenacin (VESICARE) 10 MG tablet Take 10 mg by mouth at bedtime.   Yes [provider]  tetrahydrozoline 0.05 % ophthalmic solution Place 1 drop into both eyes as needed (dry eyes).   Yes [provider]  traMADol (ULTRAM) 50 MG tablet Take 50 mg by mouth 2 (two) times daily as needed (pain).   Yes [provider]  traZODone (DESYREL) 150 MG tablet Take 150 mg by mouth at bedtime. 09/22/17  Yes [provider]   DG Chest Port 1 View  Result Date: 03/02/2020 CLINICAL DATA:  Preop for right hip surgery EXAM: PORTABLE CHEST 1 VIEW COMPARISON:  None. FINDINGS: The heart size and mediastinal contours are within normal limits. Both lungs are clear. The visualized skeletal structures  are unremarkable. Unchanged position of left chest wall pacemaker leads. IMPRESSION: No active disease. Electronically Signed   By: Deatra Robinson M.D.   On: 03/02/2020 02:45   DG Knee Right Port  Result Date: 03/02/2020 CLINICAL DATA:  Fall 1 day ago.  Proximal femoral fracture.  Pain. EXAM: PORTABLE RIGHT KNEE - 1-2 VIEW COMPARISON:  None. FINDINGS: Vascular calcifications. No distal femoral fracture. No proximal tibial or fibular fracture. No joint effusion. Tricompartmental degenerative changes. IMPRESSION: Degenerative changes in the knee. No acute abnormalities in the knee. Electronically Signed   By: Gerome Sam III M.D   On: 03/02/2020 18:22   DG Hip Unilat With Pelvis 2-3 Views Right  Result Date: 03/02/2020 CLINICAL DATA:  Fall EXAM: DG HIP (WITH OR WITHOUT PELVIS) 2-3V RIGHT COMPARISON:  None. FINDINGS: There is a comminuted intertrochanteric fracture of the right femur. No dislocation. Bony pelvis is otherwise intact. IMPRESSION: Comminuted intertrochanteric fracture of the right femur. Electronically Signed   By: Deatra Robinson M.D.   On: 03/02/2020 02:46    Positive ROS: All other systems have been reviewed and were otherwise negative with the exception of  those mentioned in the HPI and as above.  Physical Exam: General: Alert, no acute distress Cardiovascular: No pedal edema Respiratory: No cyanosis, no use of accessory musculature GI: No organomegaly, abdomen is soft and non-tender Skin: No lesions in the area of chief complaint Neurologic: Sensation intact distally Psychiatric: Patient is competent for consent with normal mood and affect Lymphatic: No axillary or cervical lymphadenopathy  MUSCULOSKELETAL: Examination of the right hip reveals no skin wounds or lesions.  He is shortened and externally rotated.  He has pain with attempted logrolling of the hip.  He is neurovascularly intact.  Assessment: Comminuted right intertrochanteric femur fracture. Covid positive. Multiple medical problems.  Plan: I discussed the findings with the patient.  He has an unstable right hip fracture that will require surgical stabilization.  We discussed the risk, benefits, and alternatives to intramedullary fixation of the right femur.  Continue n.p.o. status.  Hold chemical DVT prophylaxis.  I have marked the surgical site and signed the surgical consent.  Proceed to surgery later this afternoon.  All questions were solicited and answered.  The risks, benefits, and alternatives were discussed with the patient. There are risks associated with the surgery including, but not limited to, problems with anesthesia (death), infection, differences in leg length/angulation/rotation, fracture of bones, loosening or failure of implants, malunion, nonunion, hematoma (blood accumulation) which may require surgical drainage, blood clots, pulmonary embolism, nerve injury (foot drop), and blood vessel injury. The patient understands these risks and elects to proceed.   Jonette Pesa, MD [phone]    03/03/2020 11:52 AM

## 2020-03-03 NOTE — Progress Notes (Signed)
[facility] Progress Note  Patient: Noah Adkins    MHD:[id number]  DOA: 03/02/2020     Date of Service: the patient was seen and examined on 03/03/2020  Brief hospital course: Past medical history of type II DM, HTN, complete heart block SP pacemaker implant, CAD, RBBB, TIA.  Presents with complaints of a fall which was mechanical.  Found to have a hip fracture. Also found to have Covid positive. Plan for surgery today. Currently plan is monitor postop recovery as well as Covid.  Assessment and Plan: 1.  Hip fracture right hip Mechanical fall. Continue pain control. N.p.o. for now. Preop moderate risk given his age as well as renal function. Monitor for postop delirium as well..  2.  Covid positive. Vaccinated. Recently diagnosed Continue isolation. Discussed about medical antibody patient meets criteria but does not want it for now. We will monitor postop.  3.  Skin tear Monitor.  4.  Diabetes mellitus Sliding scale insulin.  Monitor.  5.  Acute kidney injury on CKD 3B. Renal function fluctuating. We will continue to monitor. Holding home medication. Continue IV fluids.  6.  Hypokalemia, hypocalcemia. Replacing.  Monitor.  7.  Osteoporosis On vitamin D supplementation.  Monitor.  Diet: Cardiac diet DVT Prophylaxis: SCD, pharmacological prophylaxis contraindicated due to Pending surgery  Place TED hose Start: 03/02/20 1253    Advance goals of care discussion: Full code  Family Communication: no family was present at bedside, at the time of interview.  Discussed with wife on the phone.  The pt provided permission to discuss medical plan with the family. Opportunity was given to ask question and all questions were answered satisfactorily.   Disposition:  Status is: Inpatient  Remains inpatient appropriate because:Ongoing diagnostic testing needed not appropriate for outpatient work up   Dispo:  Patient From: Home  Planned Disposition: Skilled Nursing  Facility  Expected discharge date: 03/05/20  Medically stable for discharge: No         Subjective: No nausea no vomiting.  No fever no chills.  Pain still present.  Cough.  Low-grade temperature.  Physical Exam:  General: Appear in mild distress, no Rash; Oral Mucosa Clear, moist. no Abnormal Neck Mass Or lumps, Conjunctiva normal  Cardiovascular: S1 and S2 Present, no Murmur, Respiratory: good respiratory effort, Bilateral Air entry present and CTA, no Crackles, no wheezes Abdomen: Bowel Sound present, Soft and no tenderness Extremities: no Pedal edema Neurology: alert and oriented to time, place, and person affect appropriate. no new focal deficit Gait not checked due to patient safety concerns  Vitals:   03/03/20 0437 03/03/20 0950 03/03/20 1340 03/03/20 1629  BP: (!) 155/78  132/65 132/65  Pulse: 97  85 85  Resp: 15  18 18   Temp: 98.2 F (36.8 C)  (!) 100.7 F (38.2 C) (!) 100.7 F (38.2 C)  TempSrc: Oral  Oral Oral  SpO2: 93% 92% 94%   Weight:    73.2 kg  Height:    5\' 6"  (1.676 m)    Intake/Output Summary (Last 24 hours) at 03/03/2020 1856 Last data filed at 03/03/2020 1818 Gross per 24 hour  Intake 566.83 ml  Output 950 ml  Net -383.17 ml   Filed Weights   03/03/20 1629  Weight: 73.2 kg    Data Reviewed: I have personally reviewed and interpreted daily labs, tele strips, imagings as discussed above. I reviewed all nursing notes, pharmacy notes, vitals, pertinent old records I have discussed plan of care as described above with RN  and patient/family.  CBC: Recent Labs  Lab 03/02/20 0210 03/03/20 0400  WBC 9.3 10.6*  NEUTROABS 8.2*  --   HGB 14.5 13.6  HCT 42.1 40.1  MCV 94.8 95.7  PLT 175 181   Basic Metabolic Panel: Recent Labs  Lab 03/02/20 0210 03/02/20 0759 03/03/20 0400  NA 136  --  136  K 3.4*  --  3.8  CL 98  --  98  CO2 24  --  24  GLUCOSE 126*  --  127*  BUN 49*  --  50*  CREATININE 2.17*  --  2.06*  CALCIUM 8.3*  --  8.4*   MG  --  1.6*  --     Studies: No results found.  Scheduled Meds: . [MAR Hold] acetaminophen  1,000 mg Oral Q8H  . [MAR Hold] aspirin  325 mg Oral Daily  . [MAR Hold] cholecalciferol  1,000 Units Oral Daily  . [MAR Hold] insulin aspart  0-9 Units Subcutaneous TID WC   Continuous Infusions: . lactated ringers 75 mL/hr at 03/03/20 1006   PRN Meds: [MAR Hold] bisacodyl, [MAR Hold] oxyCODONE, [MAR Hold] polyethylene glycol  Time spent: 35 minutes  Author: Lynden Oxford, MD [facility] 03/03/2020 6:56 PM  To reach On-call, see care teams to locate the attending and reach out via [email or web address]. Between 7PM-7AM, please contact night-coverage If you still have difficulty reaching the attending provider, please page the [facility] (Director on Call) for [facility] on amion for assistance.

## 2020-03-03 NOTE — Progress Notes (Signed)
Patient ID: Noah Adkins, male   DOB: 1926-09-12, 86 y.o.   MRN: [id number]  Mr Noah Adkins seen and evaluated today No other complaints other than his right hip   Labs and vitals stable  A: right IT hip fracture  P: To OR today with Swinteck for ORIF NPO Covid precautions Post op instructions per Swinteck based on intra-op findings and fixation

## 2020-03-03 NOTE — Transfer of Care (Signed)
Immediate Anesthesia Transfer of Care Note  Patient: Noah Adkins  Procedure(s) Performed: INTRAMEDULLARY (IM) NAIL INTERTROCHANTRIC (Right )  Patient Location: OR  Anesthesia Type:Spinal  Level of Consciousness: drowsy and patient cooperative  Airway & Oxygen Therapy: Patient Spontanous Breathing and Patient connected to face mask oxygen  Post-op Assessment: Report given to RN and Post -op Vital signs reviewed and stable  Post vital signs: Reviewed and stable  Last Vitals:  Vitals Value Taken Time  BP    Temp    Pulse    Resp    SpO2      Last Pain:  Vitals:   03/03/20 1629  TempSrc: Oral  PainSc:       Patients Stated Pain Goal: 0 (03/03/20 1325)  Complications: No complications documented.

## 2020-03-03 NOTE — Progress Notes (Signed)
PT Cancellation Note  Patient Details Name: Noah Adkins MRN: [id number] DOB: 1926-12-18   Cancelled Treatment:    Reason Eval/Treat Not Completed: Other (comment), scheduled for surgery today.   Sharen Heck PT Acute Rehabilitation Services Pager [phone] Office [phone]  03/03/2020, 6:16 AM

## 2020-03-03 NOTE — Anesthesia Postprocedure Evaluation (Signed)
Anesthesia Post Note  Patient: Noah Adkins  Procedure(s) Performed: INTRAMEDULLARY (IM) NAIL INTERTROCHANTRIC (Right )     Patient location during evaluation: PACU Anesthesia Type: MAC and Spinal Level of consciousness: oriented and awake and alert Pain management: pain level controlled Vital Signs Assessment: post-procedure vital signs reviewed and stable Respiratory status: spontaneous breathing, respiratory function stable and patient connected to nasal cannula oxygen Cardiovascular status: blood pressure returned to baseline and stable Postop Assessment: no headache, no backache, no apparent nausea or vomiting and spinal receding Anesthetic complications: no   No complications documented.  Last Vitals:  Vitals:   03/03/20 1956 03/03/20 2039  BP: 132/66 101/61  Pulse: 69 83  Resp:  20  Temp: (!) 34.5 C 36.6 C  SpO2: 100% 97%    Last Pain:  Vitals:   03/03/20 2039  TempSrc: Oral  PainSc:                  Evony Rezek,W. EDMOND

## 2020-03-03 NOTE — Progress Notes (Signed)
Pharmacy COVID-19 Monoclonal Antibody Screening  Noah Adkins was identified as being not hospitalized with symptoms from Covid-19 on admission but an incidental positive PCR has been documented.  The patient may qualify for the use of monoclonal antibodies (mAB) for COVID-19 viral infection to prevent worsening symptoms stemming from Covid-19 infection.  The patient was identified based on a positive COVID-19 PCR and not requiring the use of supplemental oxygen at this time.  This patient meets the FDA criteria for Emergency Use Authorization of casirivimab/imdevimab or bamlanivimab/etesevimab.  Has a (+) direct SARS-CoV-2 viral test result  Is NOT hospitalized due to COVID-19  Is within 10 days of symptom onset  Has at least one of the high risk factor(s) for progression to severe COVID-19 and/or hospitalization as defined in EUA.  Specific high risk criteria : Older age (>/= 84 yo)  Additionally: The patient has not had a positive COVID-19 PCR in the last 90 days.  The patient is fully vaccinated against COVID-19.  Since the patient is partially or fully vaccinated for COVID-19, is asymptomatic with a cycle time of < 32, and meets high risk criteria, the patient is eligible for mAB administration.   This eligibility and indication for treatment was discussed with the patient's physician: Dr. Allena Katz  Plan: Patient is eligible for monoclonal antibody infusion if patient wishes to proceed. Awaiting decision.  Cindi Carbon, PharmD 03/03/2020  10:06 AM

## 2020-03-03 NOTE — Op Note (Addendum)
OPERATIVE REPORT  SURGEON: Rod Can, MD   ASSISTANT: Staff.  PREOPERATIVE DIAGNOSIS: Comminuted Right peritrochanteric femur fracture.   POSTOPERATIVE DIAGNOSIS: Comminuted Right peritrochanteric femur fracture.   PROCEDURE: Intramedullary fixation, Right femur.   IMPLANTS: Biomet Affixus Hip Fracture Nail, 11 by 380 mm, 125 degrees. 10.5 x 100 mm Hip Fracture Nail Lag Screw. 5 x 46 mm distal interlocking screw 1.  ANESTHESIA:  MAC and Spinal  ESTIMATED BLOOD LOSS:-50 mL    ANTIBIOTICS: 2 g Ancef.  DRAINS: None.  COMPLICATIONS: None.   CONDITION: PACU - hemodynamically stable.  BRIEF CLINICAL NOTE: Noah Adkins is a 86 y.o. male who presented with a comminuted right peritrochanteric femur fracture. The patient was admitted to the hospitalist service and underwent perioperative risk stratification and medical optimization. The risks, benefits, and alternatives to the procedure were explained, and the patient elected to proceed.  PROCEDURE IN DETAIL: Surgical site was marked by myself. The patient was taken to the operating room and anesthesia was induced on the bed. The patient was then transferred to the [facility] table and the nonoperative lower extremity was scissored underneath the operative side. The fracture was reduced with traction, internal rotation, and adduction. The hip was prepped and draped in the normal sterile surgical fashion. Timeout was called verifying side and site of surgery. Preop antibiotics were given with 60 minutes of beginning the procedure.  Fluoroscopy was used to define the patient's anatomy. A 4 cm incision was made just proximal to the tip of the greater trochanter. The awl was used to obtain the standard starting point for a trochanteric entry nail under fluoroscopic control. The guidepin was placed. The entry reamer was used to open the proximal femur.  I placed the guidewire to the level of the physeal scar of the knee. I measured the length of  the guidewire. Sequential reaming was performed up to a size 12.5 mm with excellent chatter. Therefore, a size 11 by 380 mm nail was selected and assembled to the jig on the back table. The nail was placed without any difficulty. Through a separate stab incision, the cannula was placed down to the bone in preparation for the cephalomedullary device. A guidepin was placed into the femoral head using AP and lateral fluoroscopy views. The pin was measured, and then reaming was performed to the appropriate depth. The lag screw was inserted to the appropriate depth. The fracture was compressed through the jig. The setscrew was tightened. Using perfect circle technique, a distal interlocking screw was placed. The jig was removed. Final AP and lateral fluoroscopy views were obtained to confirm fracture reduction and hardware placement. Tip apex distance was appropriate. There was no chondral penetration.  The wounds were copiously irrigated with saline. The wound was closed in layers with #1 Vicryl for the fascia, 2-0 Monocryl for the deep dermal layer, and skin staples. Glue was applied to the skin. Once the glue was fully hardened, sterile dressing was applied. The patient was then awakened from anesthesia and taken to the PACU in stable condition. Sponge needle and instrument counts were correct at the end of the case 2. There were no known complications.  We will readmit the patient to the hospitalist. Weightbearing status will be weightbearing as tolerated with a walker. We will begin Lovenox for DVT prophylaxis. The patient will work with physical therapy and undergo disposition planning.

## 2020-03-04 LAB — GLUCOSE, CAPILLARY
Glucose-Capillary: 146 mg/dL — ABNORMAL HIGH (ref 70–99)
Glucose-Capillary: 194 mg/dL — ABNORMAL HIGH (ref 70–99)
Glucose-Capillary: 171 mg/dL — ABNORMAL HIGH (ref 70–99)
Glucose-Capillary: 144 mg/dL — ABNORMAL HIGH (ref 70–99)
Glucose-Capillary: 159 mg/dL — ABNORMAL HIGH (ref 70–99)

## 2020-03-04 LAB — BASIC METABOLIC PANEL
Anion gap: 12 (ref 5–15)
BUN: 55 mg/dL — ABNORMAL HIGH (ref 8–23)
CO2: 25 mmol/L (ref 22–32)
Calcium: 7.8 mg/dL — ABNORMAL LOW (ref 8.9–10.3)
Chloride: 97 mmol/L — ABNORMAL LOW (ref 98–111)
Creatinine, Ser: 2.14 mg/dL — ABNORMAL HIGH (ref 0.61–1.24)
GFR, Estimated: 29 mL/min — ABNORMAL LOW (ref >60)
Glucose, Bld: 266 mg/dL — ABNORMAL HIGH (ref 70–99)
Potassium: 3.4 mmol/L — ABNORMAL LOW (ref 3.5–5.1)
Sodium: 134 mmol/L — ABNORMAL LOW (ref 135–145)

## 2020-03-04 LAB — CBC
HCT: 30.1 % — ABNORMAL LOW (ref 39.0–52.0)
Hemoglobin: 10.2 g/dL — ABNORMAL LOW (ref 13.0–17.0)
MCH: 32.4 pg (ref 26.0–34.0)
MCHC: 33.9 g/dL (ref 30.0–36.0)
MCV: 95.6 fL (ref 80.0–100.0)
Platelets: 162 10*3/uL (ref 150–400)
RBC: 3.15 MIL/uL — ABNORMAL LOW (ref 4.22–5.81)
RDW: 12.8 % (ref 11.5–15.5)
WBC: 8.8 10*3/uL (ref 4.0–10.5)
nRBC: 0 % (ref 0.0–0.2)

## 2020-03-04 LAB — MAGNESIUM: Magnesium: 1.8 mg/dL (ref 1.7–2.4)

## 2020-03-04 MED ORDER — SODIUM CHLORIDE 0.9 % IV SOLN
Freq: Once | INTRAVENOUS | Status: AC
  Filled 2020-03-04: qty 20

## 2020-03-04 MED ORDER — OXYCODONE HCL 5 MG PO TABS: 7.5000 mg | ORAL_TABLET | ORAL | 0 refills | Status: DC | PRN

## 2020-03-04 MED ORDER — ENOXAPARIN SODIUM 30 MG/0.3ML ~~LOC~~ SOLN
30.0000 mg | SUBCUTANEOUS | Status: DC
  Administered 2020-03-04 – 2020-03-11 (×8): 30 mg via SUBCUTANEOUS
  Filled 2020-03-04 (×8): qty 0.3

## 2020-03-04 MED ORDER — ASPIRIN 81 MG PO CHEW: 81.0000 mg | CHEWABLE_TABLET | Freq: Two times a day (BID) | ORAL | 0 refills | Status: AC

## 2020-03-04 MED ORDER — LACTATED RINGERS IV SOLN: INTRAVENOUS | Status: DC

## 2020-03-04 MED ORDER — ENOXAPARIN SODIUM 30 MG/0.3ML ~~LOC~~ SOLN: 30.0000 mg | SUBCUTANEOUS | Status: DC

## 2020-03-04 MED ORDER — SENNOSIDES-DOCUSATE SODIUM 8.6-50 MG PO TABS
1.0000 | ORAL_TABLET | Freq: Two times a day (BID) | ORAL | Status: DC
  Administered 2020-03-04 – 2020-03-11 (×15): 1 via ORAL
  Filled 2020-03-04 (×15): qty 1

## 2020-03-04 NOTE — Progress Notes (Signed)
    Subjective:  Patient reports pain as mild to moderate.  Denies N/V/CP/SOB. No c/o.  Objective:   VITALS:   Vitals:   03/04/20 0339 03/04/20 0559 03/04/20 0727 03/04/20 1148  BP: 125/62  (!) 113/52 116/62  Pulse: 72  73 69  Resp: 20  19 17   Temp: 97.9 F (36.6 C)  97.9 F (36.6 C) (!) 97 F (36.1 C)  TempSrc:   Oral   SpO2: 100% 95% 96% 92%  Weight:      Height:        NAD ABD soft Sensation intact distally Intact pulses distally Dorsiflexion/Plantar flexion intact Incision: dressing C/D/I Compartment soft   Lab Results  Component Value Date   WBC 8.8 03/04/2020   HGB 10.2 (L) 03/04/2020   HCT 30.1 (L) 03/04/2020   MCV 95.6 03/04/2020   PLT 162 03/04/2020   BMET    Component Value Date/Time   NA 134 (L) 03/04/2020 0420   K 3.4 (L) 03/04/2020 0420   CL 97 (L) 03/04/2020 0420   CO2 25 03/04/2020 0420   GLUCOSE 266 (H) 03/04/2020 0420   BUN 55 (H) 03/04/2020 0420   CREATININE 2.14 (H) 03/04/2020 0420   CALCIUM 7.8 (L) 03/04/2020 0420   GFRNONAA 29 (L) 03/04/2020 0420   GFRAA 37 (L) 11/11/2017 1721     Assessment/Plan: 1 Day Post-Op   Active Problems:   Preop cardiovascular exam   Closed right hip fracture, initial encounter (HCC)   History of TIA (transient ischemic attack)   Essential hypertension   Osteoporosis   Hypokalemia   Hypocalcemia   CKD (chronic kidney disease), stage III (HCC)   Pacemaker   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

## 2020-03-04 NOTE — Progress Notes (Signed)
[facility] Progress Note  Patient: Noah Adkins    JJK:[id number]  DOA: 03/02/2020     Date of Service: the patient was seen and examined on 03/04/2020  Brief hospital course: Past medical history of type II DM, HTN, complete heart block SP pacemaker implant, CAD, RBBB, TIA.  Presents with complaints of a fall which was mechanical.  Found to have a hip fracture. Also found to have Covid positive. SP intramedullary fixation right femur 12/6 Currently plan is monitor postop recovery as well as Covid.  Assessment and Plan: 1.  Hip fracture right hip SP intramedullary fixation right femur 12/6 Mechanical fall. Continue pain control. Preop moderate risk given his age as well as renal function. Tolerated procedure very well. Weightbearing as tolerated with walker. Lovenox in-house for DVT prophylaxis and 81 mg aspirin twice daily for [redacted] weeks along with SCD and teds.  2.  Covid positive. Vaccinated. Recently diagnosed Continue isolation. Initially patient did not want monoclonal antibody therapy. Patient on 12/7 currently agreeable for monocular antibiotic therapy.  3.  Skin tear Monitor.  4.  Diabetes mellitus type II, uncontrolled with hyperglycemia secondary to steroids.  Renal complication. Sliding scale insulin.  Monitor.  5.  Acute kidney injury on CKD 3B. Renal function fluctuating. We will continue to monitor. Holding home medication. Continue IV fluids.  6.  Hypokalemia, hypocalcemia. Replacing.  Monitor.  7.  Osteoporosis On vitamin D supplementation.  Monitor.  Diet: Cardiac diet DVT Prophylaxis: SCD, pharmacological prophylaxis contraindicated due to Pending surgery  enoxaparin (LOVENOX) injection 30 mg Start: 03/04/20 0800 SCDs Start: 03/03/20 2110    Advance goals of care discussion: Full code  Family Communication: no family was present at bedside, at the time of interview.  Discussed with wife on the phone.  The pt provided permission to discuss  medical plan with the family. Opportunity was given to ask question and all questions were answered satisfactorily.   Disposition:  Status is: Inpatient  Remains inpatient appropriate because:Ongoing diagnostic testing needed not appropriate for outpatient work up   Dispo:  Patient From: Home  Planned Disposition: Skilled Nursing Facility  Expected discharge date: 03/07/20  Medically stable for discharge: No         Subjective: No nausea no vomiting.  No fever no chills.  Pain well controlled.  Tolerating oral diet.  Sleeping okay.  No cough.  Physical Exam:  General: Appear in mild distress, no Rash; Oral Mucosa Clear, moist. no Abnormal Neck Mass Or lumps, Conjunctiva normal  Cardiovascular: S1 and S2 Present, no Murmur, Respiratory: good respiratory effort, Bilateral Air entry present and CTA, no Crackles, no wheezes Abdomen: Bowel Sound present, Soft and no tenderness Extremities: no Pedal edema Neurology: alert and oriented to time, place, and person affect appropriate. no new focal deficit Gait not checked due to patient safety concerns  Vitals:   03/04/20 0727 03/04/20 1148 03/04/20 1359 03/04/20 1958  BP: (!) 113/52 116/62 104/62 (!) 114/59  Pulse: 73 69 83 81  Resp: 19 17 18 17   Temp: 97.9 F (36.6 C) (!) 97 F (36.1 C) 98 F (36.7 C) 97.9 F (36.6 C)  TempSrc: Oral  Oral   SpO2: 96% 92% 97% 91%  Weight:      Height:        Intake/Output Summary (Last 24 hours) at 03/04/2020 2111 Last data filed at 03/04/2020 1818 Gross per 24 hour  Intake 918.74 ml  Output 1000 ml  Net -81.26 ml   14/09/2019  03/03/20 1629  Weight: 73.2 kg    Data Reviewed: I have personally reviewed and interpreted daily labs, tele strips, imagings as discussed above. I reviewed all nursing notes, pharmacy notes, vitals, pertinent old records I have discussed plan of care as described above with RN and patient/family.  CBC: Recent Labs  Lab 03/02/20 0210 03/03/20 0400  03/04/20 0420  WBC 9.3 10.6* 8.8  NEUTROABS 8.2*  --   --   HGB 14.5 13.6 10.2*  HCT 42.1 40.1 30.1*  MCV 94.8 95.7 95.6  PLT 175 181 162   Basic Metabolic Panel: Recent Labs  Lab 03/02/20 0210 03/02/20 0759 03/03/20 0400 03/04/20 0420  NA 136  --  136 134*  K 3.4*  --  3.8 3.4*  CL 98  --  98 97*  CO2 24  --  24 25  GLUCOSE 126*  --  127* 266*  BUN 49*  --  50* 55*  CREATININE 2.17*  --  2.06* 2.14*  CALCIUM 8.3*  --  8.4* 7.8*  MG  --  1.6*  --  1.8    Studies: DG FEMUR PORT, MIN 2 VIEWS RIGHT  Result Date: 03/03/2020 CLINICAL DATA:  Status post right intramedullary nail placement. EXAM: RIGHT FEMUR PORTABLE 2 VIEW; DG C-ARM 1-60 MIN-NO REPORT COMPARISON:  X-ray right hip 03/03/2020, x-ray right hip 03/02/2020. FINDINGS: Intraoperative radiographs (1.3seconds) Followed by postoperative radiograph in a patient status post intramedullary nail fixation of a right intertrochanteric femoral fracture. No new acute displaced fracture. No retained radiopaque foreign body. Partially visualized right knee demonstrates moderate severe tricompartmental degenerative changes. No radiographic findings to suggest surgical consultation. The postop radiograph demonstrates edema and emphysema within the subcutaneus soft tissues of the right thigh which is consistent with postsurgical changes. Overlying right hip skin staples are noted. Vascular calcifications. IMPRESSION: Intraoperative radiographs followed by postoperative radiograph in a patient status post intramedullary nail fixation of a right intertrochanteric femoral fracture. Electronically Signed   By: Tish Frederickson M.D.   On: 03/03/2020 22:23    Scheduled Meds: . acetaminophen  1,000 mg Oral Q8H  . allopurinol  300 mg Oral Daily  . aspirin  325 mg Oral Daily  . cholecalciferol  1,000 Units Oral Daily  . darifenacin  15 mg Oral Daily  . donepezil  10 mg Oral Daily  . DULoxetine  60 mg Oral Daily  . enoxaparin (LOVENOX) injection  30  mg Subcutaneous Q24H  . insulin aspart  0-9 Units Subcutaneous TID WC  . oxybutynin  5 mg Oral BID  . pantoprazole  80 mg Oral Daily  . senna-docusate  1 tablet Oral BID  . simvastatin  10 mg Oral QHS   Continuous Infusions: . lactated ringers 75 mL/hr at 03/04/20 1029   PRN Meds: bisacodyl, menthol-cetylpyridinium **OR** phenol, naphazoline-glycerin, ondansetron **OR** ondansetron (ZOFRAN) IV, oxyCODONE  Time spent: 35 minutes  Author: Lynden Oxford, MD [facility] 03/04/2020 9:11 PM  To reach On-call, see care teams to locate the attending and reach out via [email or web address]. Between 7PM-7AM, please contact night-coverage If you still have difficulty reaching the attending provider, please page the [facility] (Director on Call) for [facility] on amion for assistance.

## 2020-03-04 NOTE — Evaluation (Signed)
Physical Therapy Evaluation Patient Details Name: Noah Adkins MRN: [id number] DOB: [birth year] Today's Date: 03/04/2020   History of Present Illness  Pt is 86 yo male with PMH including DM, HTN, complete heart block and sick sinus syndrome s/p pacemaker, CAD, and TIA.  He presented to ED s/p fall and found to have R comminuted intertrochanteric hip fx and is s/p IM nail on 03/03/20.  Pt is also COVID positive - fully vaccinated.  Clinical Impression   Pt admitted with above diagnosis. He was able to perform bed mobility and transfer to chair with min-mod A, cues, and increased time.  Pt was limited by pain.  He was on RA with stable O2 sats and denied DOE.  Pt resides with his wife and is normally fairly independent.  He will need SNF at discharge to progress mobility. Pt currently with functional limitations due to the deficits listed below (see PT Problem List). Pt will benefit from skilled PT to increase their independence and safety with mobility to allow discharge to the venue listed below.    Pt is very HOH and did well with written communication.     Follow Up Recommendations SNF    Equipment Recommendations  3in1 (PT);Rolling walker with 5" wheels;Wheelchair cushion (measurements PT);Wheelchair (measurements PT)    Recommendations for Other Services       Precautions / Restrictions Precautions Precautions: Fall Restrictions RLE Weight Bearing: Weight bearing as tolerated      Mobility  Bed Mobility Overal bed mobility: Needs Assistance Bed Mobility: Supine to Sit     Supine to sit: Min assist;HOB elevated     General bed mobility comments: Min A for R LE    Transfers Overall transfer level: Needs assistance Equipment used: Rolling walker (2 wheeled) Transfers: Sit to/from Stand Sit to Stand: Min assist         General transfer comment: Multimodal cues for safe hand placement; min A to steady  Ambulation/Gait Ambulation/Gait assistance: Mod assist Gait  Distance (Feet): 1 Feet Assistive device: Rolling walker (2 wheeled) Gait Pattern/deviations: Step-to pattern;Decreased stride length;Shuffle Gait velocity: decreased   General Gait Details: Pt with difficulty weight bearing on R LE due to pain, he was able to pivot L leg and take 1 small step to get to chair with cues for RW and posture.  Stairs            Wheelchair Mobility    Modified Rankin (Stroke Patients Only)       Balance Overall balance assessment: Needs assistance Sitting-balance support: No upper extremity supported Sitting balance-Leahy Scale: Fair     Standing balance support: Bilateral upper extremity supported Standing balance-Leahy Scale: Poor Standing balance comment: Required RW                             Pertinent Vitals/Pain Pain Assessment: Faces Faces Pain Scale: Hurts whole lot Pain Location: R hip - only with movement, reports just sore rest Pain Descriptors / Indicators: Sore;Sharp Pain Intervention(s): Limited activity within patient's tolerance;Monitored during session;Repositioned    Home Living Family/patient expects to be discharged to:: Skilled nursing facility Living Arrangements: Spouse/significant other;Children Available Help at Discharge: Family;Available 24 hours/day Type of Home: House       Home Layout: One level   Additional Comments: Pt HOH and difficulty hearing to answer full home environment or PLOF questions    Prior Function Level of Independence: Needs assistance   Gait / Transfers Assistance  Needed: Reports could ambulate without AD but does have cane and walker  ADL's / Homemaking Assistance Needed: Reports independent with ADLs  Comments: Limited hx due to [facility]     Hand Dominance        Extremity/Trunk Assessment   Upper Extremity Assessment Upper Extremity Assessment: Overall WFL for tasks assessed    Lower Extremity Assessment Lower Extremity Assessment: LLE deficits/detail;RLE  deficits/detail RLE Deficits / Details: ROM : ankle and knee WNL, hip WFL; MMT: ankle 5/5, knee 3/5, hip 1/5 LLE Deficits / Details: ROM WNL; MMT 5/5    Cervical / Trunk Assessment Cervical / Trunk Assessment: Normal  Communication   Communication: HOH;Other (comment) (Pt unable to hear with his [facility] and then staff with mask.  Communicated by writing to pt.)  Cognition Arousal/Alertness: Awake/alert Behavior During Therapy: WFL for tasks assessed/performed Overall Cognitive Status: Within Functional Limits for tasks assessed                                        General Comments      Exercises     Assessment/Plan    PT Assessment Patient needs continued PT services  PT Problem List Decreased strength;Decreased mobility;Decreased range of motion;Decreased coordination;Decreased knowledge of precautions;Decreased activity tolerance;Cardiopulmonary status limiting activity;Decreased balance;Decreased knowledge of use of DME;Pain       PT Treatment Interventions DME instruction;Therapeutic activities;Modalities;Gait training;Therapeutic exercise;Patient/family education;Balance training;Functional mobility training    PT Goals (Current goals can be found in the Care Plan section)  Acute Rehab PT Goals Patient Stated Goal: return to walking PT Goal Formulation: With patient Time For Goal Achievement: 03/18/20 Potential to Achieve Goals: Good    Frequency Min 3X/week   Barriers to discharge        Co-evaluation               AM-PAC PT "6 Clicks" Mobility  Outcome Measure Help needed turning from your back to your side while in a flat bed without using bedrails?: A Little Help needed moving from lying on your back to sitting on the side of a flat bed without using bedrails?: A Little Help needed moving to and from a bed to a chair (including a wheelchair)?: A Little Help needed standing up from a chair using your arms (e.g., wheelchair or bedside  chair)?: A Little Help needed to walk in hospital room?: A Lot Help needed climbing 3-5 steps with a railing? : Total 6 Click Score: 15    End of Session Equipment Utilized During Treatment: Gait belt Activity Tolerance: Patient tolerated treatment well Patient left: with chair alarm set;in chair;with call bell/phone within reach Nurse Communication: Mobility status;Other (comment) (may need pain meds prior to transfer back to bed) PT Visit Diagnosis: Unsteadiness on feet (R26.81);Other abnormalities of gait and mobility (R26.89);Muscle weakness (generalized) (M62.81);History of falling (Z91.81)    Time: 5462-7035 PT Time Calculation (min) (ACUTE ONLY): 29 min   Charges:   PT Evaluation $PT Eval Moderate Complexity: 1 Mod PT Treatments $Therapeutic Activity: 8-22 mins        Anise Salvo, PT Acute Rehab Services Pager [phone] [facility] [phone]    Rayetta Humphrey 03/04/2020, 5:37 PM

## 2020-03-05 ENCOUNTER — Encounter (HOSPITAL_COMMUNITY): Payer: Self-pay | Admitting: Orthopedic Surgery

## 2020-03-05 DIAGNOSIS — U071 COVID-19: Secondary | ICD-10-CM

## 2020-03-05 DIAGNOSIS — N1832 Chronic kidney disease, stage 3b: Secondary | ICD-10-CM

## 2020-03-05 DIAGNOSIS — Z8673 Personal history of transient ischemic attack (TIA), and cerebral infarction without residual deficits: Secondary | ICD-10-CM

## 2020-03-05 LAB — CBC
HCT: 29.9 % — ABNORMAL LOW (ref 39.0–52.0)
Hemoglobin: 10.1 g/dL — ABNORMAL LOW (ref 13.0–17.0)
MCH: 32.1 pg (ref 26.0–34.0)
MCHC: 33.8 g/dL (ref 30.0–36.0)
MCV: 94.9 fL (ref 80.0–100.0)
Platelets: 170 10*3/uL (ref 150–400)
RBC: 3.15 MIL/uL — ABNORMAL LOW (ref 4.22–5.81)
RDW: 12.7 % (ref 11.5–15.5)
WBC: 6.4 10*3/uL (ref 4.0–10.5)
nRBC: 0 % (ref 0.0–0.2)

## 2020-03-05 LAB — GLUCOSE, CAPILLARY
Glucose-Capillary: 106 mg/dL — ABNORMAL HIGH (ref 70–99)
Glucose-Capillary: 100 mg/dL — ABNORMAL HIGH (ref 70–99)
Glucose-Capillary: 141 mg/dL — ABNORMAL HIGH (ref 70–99)
Glucose-Capillary: 116 mg/dL — ABNORMAL HIGH (ref 70–99)

## 2020-03-05 NOTE — TOC Initial Note (Signed)
Transition of Care [facility]) - Initial/Assessment Note    Patient Details  Name: Noah Adkins MRN: [id number] Date of Birth: September 22, 1926  Transition of Care [facility]) CM/SW Contact:    Ida Rogue, LCSW Phone Number: 03/05/2020, 2:00 PM  Clinical Narrative:   Patient seen in follow up to PT recommendation of SNF.  Noah Adkins , while making it clear that he would prefer to go home, is accepting of this recommendation as his wife is also COVID positive and not feeling well, and he knows she would not be able to provide the care he currently requires.  He asked that I call her, and in fact, she confirmed that she is feeling bad enough to come to get checked out at the hospital herself.  She confirms that SNF is the best option for him. Bed search initiated, insurance authorization request initiated.  TOC will continue to follow during the course of hospitalization.                 Expected Discharge Plan: Skilled Nursing Facility Barriers to Discharge: SNF Pending bed offer   Patient Goals and CMS Choice     Choice offered to / list presented to : Spouse  Expected Discharge Plan and Services Expected Discharge Plan: Skilled Nursing Facility   Discharge Planning Services: CM Consult Post Acute Care Choice: Skilled Nursing Facility Living arrangements for the past 2 months: Apartment                                      Prior Living Arrangements/Services Living arrangements for the past 2 months: Apartment Lives with:: Spouse Patient language and need for interpreter reviewed:: Yes        Need for Family Participation in Patient Care: Yes (Comment) Care giver support system in place?: Yes (comment) Current home services: DME Criminal Activity/Legal Involvement Pertinent to Current Situation/Hospitalization: No - Comment as needed  Activities of Daily Living Home Assistive Devices/Equipment: Shower chair with back, Other (Comment), Wheelchair ADL Screening (condition at time  of admission) Patient's cognitive ability adequate to safely complete daily activities?: Yes Is the patient deaf or have difficulty hearing?: Yes Does the patient have difficulty seeing, even when wearing glasses/contacts?: No Does the patient have difficulty concentrating, remembering, or making decisions?: No Patient able to express need for assistance with ADLs?: Yes Does the patient have difficulty dressing or bathing?: Yes Independently performs ADLs?: No Communication: Independent Dressing (OT): Appropriate for developmental age, Needs assistance Is this a change from baseline?: Change from baseline, expected to last <3days Grooming: Needs assistance Is this a change from baseline?: Change from baseline, expected to last <3 days Feeding: Independent Bathing: Needs assistance Is this a change from baseline?: Change from baseline, expected to last <3 days Toileting: Needs assistance Is this a change from baseline?: Change from baseline, expected to last <3 days In/Out Bed: Needs assistance Is this a change from baseline?: Change from baseline, expected to last <3 days Does the patient have difficulty walking or climbing stairs?: Yes Weakness of Legs: Right Weakness of Arms/Hands: Both  Permission Sought/Granted Permission sought to share information with : Family Supports Permission granted to share information with : Yes, Verbal Permission Granted  Share Information with NAME: Noah, Adkins" (Spouse) [phone]           Emotional Assessment Appearance:: Appears stated age Attitude/Demeanor/Rapport: Engaged Affect (typically observed): Appropriate Orientation: : Oriented to Self,  Oriented to Place, Oriented to Situation Alcohol / Substance Use: Not Applicable Psych Involvement: No (comment)  Admission diagnosis:  Hip fracture (HCC) [S72.009A] Fall, initial encounter [W19.XXXA] Closed displaced intertrochanteric fracture of right femur, initial encounter (HCC)  [S72.141A] COVID-19 [U07.1] Patient Active Problem List   Diagnosis Date Noted  . Closed right hip fracture, initial encounter (HCC) 03/02/2020  . History of TIA (transient ischemic attack) 03/02/2020  . Essential hypertension 03/02/2020  . Osteoporosis 03/02/2020  . Hypokalemia 03/02/2020  . Hypocalcemia 03/02/2020  . CKD (chronic kidney disease), stage III (HCC) 03/02/2020  . Pacemaker 03/02/2020  . Type 2 diabetes mellitus with unspecified complications (HCC) 03/02/2020  . TIA (transient ischemic attack) 04/10/2017  . Syncopal seizure (HCC)   . Syncope 10/10/2015  . Symptomatic bradycardia 10/10/2015  . Postural hypotension 10/30/2013  . Preop cardiovascular exam 10/30/2013  . RBBB 10/30/2013   PCP:  Deatra James, MD Pharmacy:   [facility]- [place], [place] - [address] [place] [address] Phone: [phone] Fax: [phone]  [facility] - [place], [place] - [address] [place] [address] Phone: [phone] Fax: [phone]     Social Determinants of Health (SDOH) Interventions    Readmission Risk Interventions No flowsheet data found.

## 2020-03-05 NOTE — Progress Notes (Signed)
PROGRESS NOTE  WLADYSLAW HENRICHS OEV:[id number] DOB: 05/11/1926 DOA: 03/02/2020 PCP: Deatra James, MD   LOS: 3 days   Brief Narrative / Interim history: 86 year old male with history of CKD 3B, DM, HTN, complete heart block status post pacemaker, CAD, prior TIAs came into the hospital with a mechanical fall resulting in a right hip fracture.  Incidentally he was also found to be Covid positive.  Subjective / 24h Interval events: Doing overall well this morning.  Less pain in the right hip.  Denies any shortness of breath, denies any cough or chest congestion.  Assessment & Plan: Principal Problem Right hip fracture -Orthopedic surgery consulted, patient was taken to the OR and is status post intramedullary fixation of the right femur on 12/6.  Continue Lovenox for DVT prophylaxis in-house then 81 mg twice daily for 6 weeks.  Work with physical therapy, recommended SNF  Active Problems Covid positive -No symptoms, appears to be incidental.  Received monoclonal antibodies on 12/7.  Given lack of symptoms, afebrile, needs total of 10-day isolation  Acute kidney injury on chronic kidney disease stage IIIb -His creatinine in 2019 was 1.4-1.8, and around 2.0 in 2017.  Currently his creatinine is 2.0-2.1, I suspect this is his new baseline -Has been on fluids so far, stop, encourage p.o. intake, avoid fluid overload -Recheck renal function in the morning  Symptomatic bradycardia status post pacemaker -Noted  Mild dementia -Continue Aricept  CAD, hyperlipidemia -Continue statin  Type 2 diabetes mellitus, uncontrolled with hyperglycemia -Continue sliding scale, monitor CBGs  CBG (last 3)  Recent Labs    03/04/20 1959 03/05/20 0804 03/05/20 1105  GLUCAP 159* 106* 100*    Scheduled Meds: . acetaminophen  1,000 mg Oral Q8H  . allopurinol  300 mg Oral Daily  . aspirin  325 mg Oral Daily  . cholecalciferol  1,000 Units Oral Daily  . darifenacin  15 mg Oral Daily  . donepezil  10 mg  Oral Daily  . DULoxetine  60 mg Oral Daily  . enoxaparin (LOVENOX) injection  30 mg Subcutaneous Q24H  . insulin aspart  0-9 Units Subcutaneous TID WC  . oxybutynin  5 mg Oral BID  . pantoprazole  80 mg Oral Daily  . senna-docusate  1 tablet Oral BID  . simvastatin  10 mg Oral QHS   Continuous Infusions: . lactated ringers 75 mL/hr at 03/05/20 1220   PRN Meds:.bisacodyl, menthol-cetylpyridinium **OR** phenol, naphazoline-glycerin, ondansetron **OR** ondansetron (ZOFRAN) IV, oxyCODONE  Diet Orders (From admission, onward)    Start     Ordered   03/03/20 2110  Diet Carb Modified Fluid consistency: Thin; Room service appropriate? Yes  Diet effective now       Question Answer Comment  Calorie Level Medium 1600-2000   Fluid consistency: Thin   Room service appropriate? Yes      03/03/20 2109          DVT prophylaxis: enoxaparin (LOVENOX) injection 30 mg Start: 03/04/20 0800 SCDs Start: 03/03/20 2110    Code Status: Full Code  Family Communication: No family at bedside  Status is: Inpatient  Remains inpatient appropriate because:Inpatient level of care appropriate due to severity of illness   Dispo:  Patient From: Home  Planned Disposition: Skilled Nursing Facility  Expected discharge date: 03/07/20  Medically stable for discharge: No  Consultants:  Orthopedic surgery   Procedures:  Right IM nail 12/6  Microbiology  None   Antimicrobials: None     Objective: Vitals:   03/04/20 1148 03/04/20 1359  03/04/20 1958 03/05/20 0438  BP: 116/62 104/62 (!) 114/59 131/68  Pulse: 69 83 81 87  Resp: 17 18 17 20   Temp: (!) 97 F (36.1 C) 98 F (36.7 C) 97.9 F (36.6 C) 97.9 F (36.6 C)  TempSrc:  Oral    SpO2: 92% 97% 91% 93%  Weight:      Height:        Intake/Output Summary (Last 24 hours) at 03/05/2020 1322 Last data filed at 03/05/2020 1219 Gross per 24 hour  Intake 632.01 ml  Output 850 ml  Net -217.99 ml   Filed Weights   03/03/20 1629  Weight: 73.2  kg    Examination:  Constitutional: NAD Eyes: no scleral icterus ENMT: Mucous membranes are moist.  Neck: normal, supple Respiratory: clear to auscultation bilaterally, no wheezing, no crackles. Normal respiratory effort.  Cardiovascular: Regular rate and rhythm, no murmurs / rubs / gallops. No LE edema. Good peripheral pulses Abdomen: non distended, no tenderness. Bowel sounds positive.  Musculoskeletal: no clubbing / cyanosis.  Skin: no rashes Neurologic: non focal    Data Reviewed: I have independently reviewed following labs and imaging studies   CBC: Recent Labs  Lab 03/02/20 0210 03/03/20 0400 03/04/20 0420 03/05/20 0427  WBC 9.3 10.6* 8.8 6.4  NEUTROABS 8.2*  --   --   --   HGB 14.5 13.6 10.2* 10.1*  HCT 42.1 40.1 30.1* 29.9*  MCV 94.8 95.7 95.6 94.9  PLT 175 181 162 170   Basic Metabolic Panel: Recent Labs  Lab 03/02/20 0210 03/02/20 0759 03/03/20 0400 03/04/20 0420  NA 136  --  136 134*  K 3.4*  --  3.8 3.4*  CL 98  --  98 97*  CO2 24  --  24 25  GLUCOSE 126*  --  127* 266*  BUN 49*  --  50* 55*  CREATININE 2.17*  --  2.06* 2.14*  CALCIUM 8.3*  --  8.4* 7.8*  MG  --  1.6*  --  1.8   Liver Function Tests: No results for input(s): AST, ALT, ALKPHOS, BILITOT, PROT, ALBUMIN in the last 168 hours. Coagulation Profile: Recent Labs  Lab 03/02/20 0210  INR 1.1   HbA1C: No results for input(s): HGBA1C in the last 72 hours. CBG: Recent Labs  Lab 03/04/20 1144 03/04/20 1650 03/04/20 1959 03/05/20 0804 03/05/20 1105  GLUCAP 171* 144* 159* 106* 100*    Recent Results (from the past 240 hour(s))  Resp Panel by RT-PCR (Flu A&B, Covid) Nasopharyngeal Swab     Status: Abnormal   Collection Time: 03/02/20  2:10 AM   Specimen: Nasopharyngeal Swab; Nasopharyngeal(NP) swabs in vial transport medium  Result Value Ref Range Status   SARS Coronavirus 2 by RT PCR POSITIVE (A) NEGATIVE Final    Comment: CRITICAL RESULT CALLED TO, READ BACK BY AND VERIFIED  WITH: 14/05/21 RN 03/02/20@0344  BY P.HENDERSON    Influenza A by PCR NEGATIVE NEGATIVE Final   Influenza B by PCR NEGATIVE NEGATIVE Final    Comment: (NOTE) The Xpert Xpress SARS-CoV-2/FLU/RSV plus assay is intended as an aid in the diagnosis of influenza from Nasopharyngeal swab specimens and should not be used as a sole basis for treatment. Nasal washings and aspirates are unacceptable for Xpert Xpress SARS-CoV-2/FLU/RSV testing.  Fact Sheet for Patients: 14/05/21  Fact Sheet for Healthcare Providers: BloggerCourse.com  This test is not yet approved or cleared by the [employer] FDA and has been authorized for detection and/or diagnosis of SARS-CoV-2 by FDA  under an Emergency Use Authorization (EUA). This EUA will remain in effect (meaning this test can be used) for the duration of the COVID-19 declaration under Section 564(b)(1) of the Act, 21 U.S.C. section 360bbb-3(b)(1), unless the authorization is terminated or revoked.  Performed at [facility], [address] [place], [place] [address]   Surgical pcr screen     Status: None   Collection Time: 03/03/20 11:50 AM   Specimen: Nasal Mucosa; Nasal Swab  Result Value Ref Range Status   MRSA, PCR NEGATIVE NEGATIVE Final   Staphylococcus aureus NEGATIVE NEGATIVE Final    Comment: (NOTE) The Xpert SA Assay (FDA approved for NASAL specimens in patients 63 years of age and older), is one component of a comprehensive surveillance program. It is not intended to diagnose infection nor to guide or monitor treatment. Performed at [facility], [address], [place], [place] [address]      Radiology Studies: No results found.  Pamella Pert, MD, PhD [facility]  Between 7 am - 7 pm I am available, please contact me via Amion or Securechat  Between 7 pm - 7 am I am not available, please contact night coverage  MD/APP via Amion

## 2020-03-05 NOTE — NC FL2 (Signed)
[place] MEDICAID FL2 LEVEL OF CARE SCREENING TOOL     IDENTIFICATION  Patient Name: Noah Adkins Birthdate: [birth year] Sex: male Admission Date (Current Location): 03/02/2020  [facility] and [place] Number:  Producer, television/film/video and Address:  [facility],  [address] [place], [place] [address]      Provider Number: [id number]  Attending Physician Name and Address:  Leatha Gilding, MD  Relative Name and Phone Number:  Rivan, Siordia" (Spouse) [phone]    Current Level of Care: Hospital Recommended Level of Care: Skilled Nursing Facility Prior Approval Number:    Date Approved/Denied:   PASRR Number: [id number]  Discharge Plan: SNF    Current Diagnoses: Patient Active Problem List   Diagnosis Date Noted  . Closed right hip fracture, initial encounter (HCC) 03/02/2020  . History of TIA (transient ischemic attack) 03/02/2020  . Essential hypertension 03/02/2020  . Osteoporosis 03/02/2020  . Hypokalemia 03/02/2020  . Hypocalcemia 03/02/2020  . CKD (chronic kidney disease), stage III (HCC) 03/02/2020  . Pacemaker 03/02/2020  . Type 2 diabetes mellitus with unspecified complications (HCC) 03/02/2020  . TIA (transient ischemic attack) 04/10/2017  . Syncopal seizure (HCC)   . Syncope 10/10/2015  . Symptomatic bradycardia 10/10/2015  . Postural hypotension 10/30/2013  . Preop cardiovascular exam 10/30/2013  . RBBB 10/30/2013    Orientation RESPIRATION BLADDER Height & Weight     Self, Time, Situation, Place  O2 (1L [place]) External catheter Weight: 73.2 kg Height:  5\' 6"  (167.6 cm)  BEHAVIORAL SYMPTOMS/MOOD NEUROLOGICAL BOWEL NUTRITION STATUS   (none)  (none) Continent Diet (see d/c summary)  AMBULATORY STATUS COMMUNICATION OF NEEDS Skin   Extensive Assist Verbally Surgical wounds (R Hip)                       Personal Care Assistance Level of Assistance  Bathing, Feeding, Dressing Bathing Assistance: Maximum assistance Feeding assistance:  Independent Dressing Assistance: Maximum assistance     Functional Limitations Info  Sight, Hearing, Speech Sight Info: Adequate Hearing Info: Impaired Speech Info: Adequate    SPECIAL CARE FACTORS FREQUENCY  PT (By licensed PT), OT (By licensed OT)     PT Frequency: 5X/W OT Frequency: 5X/W            Contractures Contractures Info: Not present    Additional Factors Info  Code Status, Allergies Code Status Info: full Allergies Info: Atenolol,Dilacor Xr Diltiazem Hcl, Isoptin Sr Verapamil Hcl Er, Lopressor Metoprolol Tartrate, Norvasc Amlodipine Besylate           Current Medications (03/05/2020):  This is the current hospital active medication list Current Facility-Administered Medications  Medication Dose Route Frequency Provider Last Rate Last Admin  . acetaminophen (TYLENOL) tablet 1,000 mg  1,000 mg Oral Q8H Swinteck, 14/10/2019, MD   1,000 mg at 03/05/20 1355  . allopurinol (ZYLOPRIM) tablet 300 mg  300 mg Oral Daily 14/08/21, MD   300 mg at 03/05/20 0904  . aspirin EC tablet 325 mg  325 mg Oral Daily 14/08/21, MD   325 mg at 03/05/20 0905  . bisacodyl (DULCOLAX) EC tablet 5 mg  5 mg Oral Daily PRN 14/08/21, MD   5 mg at 03/05/20 0520  . cholecalciferol (VITAMIN D3) tablet 1,000 Units  1,000 Units Oral Daily 14/08/21, MD   1,000 Units at 03/05/20 0906  . darifenacin (ENABLEX) 24 hr tablet 15 mg  15 mg Oral Daily 14/08/21, MD   15 mg at 03/05/20 0905  .  donepezil (ARICEPT) tablet 10 mg  10 mg Oral Daily Samson Frederic, MD   10 mg at 03/05/20 0905  . DULoxetine (CYMBALTA) DR capsule 60 mg  60 mg Oral Daily Samson Frederic, MD   60 mg at 03/05/20 0904  . enoxaparin (LOVENOX) injection 30 mg  30 mg Subcutaneous Q24H Rolly Salter, MD   30 mg at 03/05/20 0905  . insulin aspart (novoLOG) injection 0-9 Units  0-9 Units Subcutaneous TID WC Samson Frederic, MD   2 Units at 03/04/20 1736  . menthol-cetylpyridinium (CEPACOL) lozenge 3 mg  1  lozenge Oral PRN Swinteck, Arlys John, MD       Or  . phenol (CHLORASEPTIC) mouth spray 1 spray  1 spray Mouth/Throat PRN Swinteck, Arlys John, MD      . naphazoline-glycerin (CLEAR EYES REDNESS) ophth solution 1 drop  1 drop Both Eyes QID PRN Samson Frederic, MD   1 drop at 03/04/20 1937  . ondansetron (ZOFRAN) tablet 4 mg  4 mg Oral Q6H PRN Swinteck, Arlys John, MD       Or  . ondansetron [facility]) injection 4 mg  4 mg Intravenous Q6H PRN Swinteck, Arlys John, MD      . oxybutynin (DITROPAN) tablet 5 mg  5 mg Oral BID Samson Frederic, MD   5 mg at 03/05/20 0904  . oxyCODONE (Oxy IR/ROXICODONE) immediate release tablet 5-7.5 mg  5-7.5 mg Oral Q4H PRN Samson Frederic, MD   5 mg at 03/05/20 0512  . pantoprazole (PROTONIX) EC tablet 80 mg  80 mg Oral Daily Samson Frederic, MD   80 mg at 03/05/20 0905  . senna-docusate (Senokot-S) tablet 1 tablet  1 tablet Oral BID Rolly Salter, MD   1 tablet at 03/05/20 0905  . simvastatin (ZOCOR) tablet 10 mg  10 mg Oral QHS Samson Frederic, MD   10 mg at 03/04/20 2124     Discharge Medications: Please see discharge summary for a list of discharge medications.  Relevant Imaging Results:  Relevant Lab Results:   Additional Information [phone] [place], [place]

## 2020-03-06 LAB — COMPREHENSIVE METABOLIC PANEL
ALT: 8 U/L (ref 0–44)
AST: 31 U/L (ref 15–41)
Albumin: 2.5 g/dL — ABNORMAL LOW (ref 3.5–5.0)
Alkaline Phosphatase: 67 U/L (ref 38–126)
Anion gap: 9 (ref 5–15)
BUN: 49 mg/dL — ABNORMAL HIGH (ref 8–23)
CO2: 26 mmol/L (ref 22–32)
Calcium: 8.1 mg/dL — ABNORMAL LOW (ref 8.9–10.3)
Chloride: 100 mmol/L (ref 98–111)
Creatinine, Ser: 2.07 mg/dL — ABNORMAL HIGH (ref 0.61–1.24)
GFR, Estimated: 30 mL/min — ABNORMAL LOW (ref >60)
Glucose, Bld: 96 mg/dL (ref 70–99)
Potassium: 4 mmol/L (ref 3.5–5.1)
Sodium: 135 mmol/L (ref 135–145)
Total Bilirubin: 0.9 mg/dL (ref 0.3–1.2)
Total Protein: 5.5 g/dL — ABNORMAL LOW (ref 6.5–8.1)

## 2020-03-06 LAB — GLUCOSE, CAPILLARY
Glucose-Capillary: 76 mg/dL (ref 70–99)
Glucose-Capillary: 134 mg/dL — ABNORMAL HIGH (ref 70–99)
Glucose-Capillary: 152 mg/dL — ABNORMAL HIGH (ref 70–99)
Glucose-Capillary: 122 mg/dL — ABNORMAL HIGH (ref 70–99)

## 2020-03-06 LAB — CBC
HCT: 28.5 % — ABNORMAL LOW (ref 39.0–52.0)
Hemoglobin: 9.6 g/dL — ABNORMAL LOW (ref 13.0–17.0)
MCH: 32.4 pg (ref 26.0–34.0)
MCHC: 33.7 g/dL (ref 30.0–36.0)
MCV: 96.3 fL (ref 80.0–100.0)
Platelets: 203 10*3/uL (ref 150–400)
RBC: 2.96 MIL/uL — ABNORMAL LOW (ref 4.22–5.81)
RDW: 12.7 % (ref 11.5–15.5)
WBC: 5.6 10*3/uL (ref 4.0–10.5)
nRBC: 0 % (ref 0.0–0.2)

## 2020-03-06 LAB — C-REACTIVE PROTEIN: CRP: 19.8 mg/dL — ABNORMAL HIGH (ref <1.0)

## 2020-03-06 NOTE — Progress Notes (Signed)
PROGRESS NOTE  Noah Adkins QMV:[id number] DOB: 04-Dec-1926 DOA: 03/02/2020 PCP: Deatra James, MD   LOS: 4 days   Brief Narrative / Interim history: 86 year old male with history of CKD 3B, DM, HTN, complete heart block status post pacemaker, CAD, prior TIAs came into the hospital with a mechanical fall resulting in a right hip fracture.  Incidentally he was also found to be Covid positive.  Subjective / 24h Interval events: Doing well, denies shortness of breath, no abdominal pain, no nausea/vomiting, no cough or congestion.   Assessment & Plan: Principal Problem Right hip fracture -Orthopedic surgery consulted, patient was taken to the OR and is status post intramedullary fixation of the right femur on 12/6.  Continue Lovenox for DVT prophylaxis in-house then 81 mg twice daily for 6 weeks. SNF placement pending   Active Problems Covid positive -No symptoms, appears to be incidental.  Received monoclonal antibodies on 12/7.  Given lack of symptoms, afebrile, needs total of 10-day isolation. He tested positive on 12/5 and can come off isolation 12/16  Acute kidney injury on chronic kidney disease stage IIIb -His creatinine in 2019 was 1.4-1.8, and around 2.0 in 2017.  Currently his creatinine is 2.0-2.1, I suspect this is his new baseline -Has been on fluids so far, stop, encourage p.o. intake, avoid fluid overload -renal function stable, Cr 2.0   Symptomatic bradycardia status post pacemaker -Noted  Mild dementia -Continue Aricept  CAD, hyperlipidemia -Continue statin  Type 2 diabetes mellitus, uncontrolled with hyperglycemia -Continue sliding scale, monitor CBGs  CBG (last 3)  Recent Labs    03/05/20 2107 03/06/20 0753 03/06/20 1130  GLUCAP 116* 76 134*    Scheduled Meds: . acetaminophen  1,000 mg Oral Q8H  . allopurinol  300 mg Oral Daily  . aspirin  325 mg Oral Daily  . cholecalciferol  1,000 Units Oral Daily  . darifenacin  15 mg Oral Daily  . donepezil  10  mg Oral Daily  . DULoxetine  60 mg Oral Daily  . enoxaparin (LOVENOX) injection  30 mg Subcutaneous Q24H  . insulin aspart  0-9 Units Subcutaneous TID WC  . oxybutynin  5 mg Oral BID  . pantoprazole  80 mg Oral Daily  . senna-docusate  1 tablet Oral BID  . simvastatin  10 mg Oral QHS   Continuous Infusions:  PRN Meds:.bisacodyl, menthol-cetylpyridinium **OR** phenol, naphazoline-glycerin, ondansetron **OR** ondansetron (ZOFRAN) IV, oxyCODONE  Diet Orders (From admission, onward)    Start     Ordered   03/03/20 2110  Diet Carb Modified Fluid consistency: Thin; Room service appropriate? Yes  Diet effective now       Question Answer Comment  Calorie Level Medium 1600-2000   Fluid consistency: Thin   Room service appropriate? Yes      03/03/20 2109          DVT prophylaxis: enoxaparin (LOVENOX) injection 30 mg Start: 03/04/20 0800 SCDs Start: 03/03/20 2110    Code Status: Full Code  Family Communication: No family at bedside  Status is: Inpatient  Remains inpatient appropriate because:Inpatient level of care appropriate due to severity of illness   Dispo:  Patient From: Home  Planned Disposition: Skilled Nursing Facility  Expected discharge date: 03/07/2020  Medically stable for discharge: Yes  Consultants:  Orthopedic surgery   Procedures:  Right IM nail 12/6  Microbiology  None   Antimicrobials: None     Objective: Vitals:   03/05/20 0438 03/05/20 1449 03/05/20 2105 03/06/20 0442  BP: 131/68 139/60 127/62  137/67  Pulse: 87 97 71 75  Resp: 20 16 20 19   Temp: 97.9 F (36.6 C) 98.1 F (36.7 C) 97.7 F (36.5 C) 97.7 F (36.5 C)  TempSrc:  Oral Oral Oral  SpO2: 93% 97% 95% 95%  Weight:      Height:        Intake/Output Summary (Last 24 hours) at 03/06/2020 1132 Last data filed at 03/06/2020 0946 Gross per 24 hour  Intake 2957.42 ml  Output 900 ml  Net 2057.42 ml   Filed Weights   03/03/20 1629  Weight: 73.2 kg     Examination:  Constitutional: NAD Eyes: no icterus  ENMT: mmm Neck: normal, supple Respiratory: CTA biL, no wheezing, no crackles Cardiovascular: RRR, no MRG, no edema  Abdomen: soft, NT, ND, BS+ Musculoskeletal: no clubbing / cyanosis.  Skin: no rashes Neurologic: non focal    Data Reviewed: I have independently reviewed following labs and imaging studies   CBC: Recent Labs  Lab 03/02/20 0210 03/03/20 0400 03/04/20 0420 03/05/20 0427 03/06/20 0404  WBC 9.3 10.6* 8.8 6.4 5.6  NEUTROABS 8.2*  --   --   --   --   HGB 14.5 13.6 10.2* 10.1* 9.6*  HCT 42.1 40.1 30.1* 29.9* 28.5*  MCV 94.8 95.7 95.6 94.9 96.3  PLT 175 181 162 170 203   Basic Metabolic Panel: Recent Labs  Lab 03/02/20 0210 03/02/20 0759 03/03/20 0400 03/04/20 0420 03/06/20 0404  NA 136  --  136 134* 135  K 3.4*  --  3.8 3.4* 4.0  CL 98  --  98 97* 100  CO2 24  --  24 25 26   GLUCOSE 126*  --  127* 266* 96  BUN 49*  --  50* 55* 49*  CREATININE 2.17*  --  2.06* 2.14* 2.07*  CALCIUM 8.3*  --  8.4* 7.8* 8.1*  MG  --  1.6*  --  1.8  --    Liver Function Tests: Recent Labs  Lab 03/06/20 0404  AST 31  ALT 8  ALKPHOS 67  BILITOT 0.9  PROT 5.5*  ALBUMIN 2.5*   Coagulation Profile: Recent Labs  Lab 03/02/20 0210  INR 1.1   HbA1C: No results for input(s): HGBA1C in the last 72 hours. CBG: Recent Labs  Lab 03/05/20 1105 03/05/20 1643 03/05/20 2107 03/06/20 0753 03/06/20 1130  GLUCAP 100* 141* 116* 76 134*    Recent Results (from the past 240 hour(s))  Resp Panel by RT-PCR (Flu A&B, Covid) Nasopharyngeal Swab     Status: Abnormal   Collection Time: 03/02/20  2:10 AM   Specimen: Nasopharyngeal Swab; Nasopharyngeal(NP) swabs in vial transport medium  Result Value Ref Range Status   SARS Coronavirus 2 by RT PCR POSITIVE (A) NEGATIVE Final    Comment: CRITICAL RESULT CALLED TO, READ BACK BY AND VERIFIED WITH: 14/09/21 RN 03/02/20@0344  BY P.HENDERSON    Influenza A by PCR  NEGATIVE NEGATIVE Final   Influenza B by PCR NEGATIVE NEGATIVE Final    Comment: (NOTE) The Xpert Xpress SARS-CoV-2/FLU/RSV plus assay is intended as an aid in the diagnosis of influenza from Nasopharyngeal swab specimens and should not be used as a sole basis for treatment. Nasal washings and aspirates are unacceptable for Xpert Xpress SARS-CoV-2/FLU/RSV testing.  Fact Sheet for Patients: Kathlene November  Fact Sheet for Healthcare Providers: 14/05/21  This test is not yet approved or cleared by the [employer] FDA and has been authorized for detection and/or diagnosis of SARS-CoV-2 by FDA under  an Emergency Use Authorization (EUA). This EUA will remain in effect (meaning this test can be used) for the duration of the COVID-19 declaration under Section 564(b)(1) of the Act, 21 U.S.C. section 360bbb-3(b)(1), unless the authorization is terminated or revoked.  Performed at [facility], [address] [place], [place] [address]   Surgical pcr screen     Status: None   Collection Time: 03/03/20 11:50 AM   Specimen: Nasal Mucosa; Nasal Swab  Result Value Ref Range Status   MRSA, PCR NEGATIVE NEGATIVE Final   Staphylococcus aureus NEGATIVE NEGATIVE Final    Comment: (NOTE) The Xpert SA Assay (FDA approved for NASAL specimens in patients 59 years of age and older), is one component of a comprehensive surveillance program. It is not intended to diagnose infection nor to guide or monitor treatment. Performed at [facility], [address], [place], [place] [address]      Radiology Studies: No results found.  Pamella Pert, MD, PhD [facility]  Between 7 am - 7 pm I am available, please contact me via Amion or Securechat  Between 7 pm - 7 am I am not available, please contact night coverage MD/APP via Amion

## 2020-03-06 NOTE — Progress Notes (Signed)
Physical Therapy Treatment Patient Details Name: Noah Adkins MRN: [id number] DOB: 11-20-1926 Today's Date: 03/06/2020    History of Present Illness Pt is 86 yo male with PMH including DM, HTN, complete heart block and sick sinus syndrome s/p pacemaker, CAD, and TIA.  He presented to ED s/p fall and found to have R comminuted intertrochanteric hip fx and is s/p IM nail on 03/03/20.  Pt is also COVID positive - fully vaccinated.    PT Comments    Patient demonstrates improved bed mobility. During transfers, patient has difficulty taking a step with left leg due to decreased support on  Right leg. Continue PT for mobility.  Follow Up Recommendations  SNF     Equipment Recommendations  3in1 (PT);Rolling walker with 5" wheels;Wheelchair cushion (measurements PT);Wheelchair (measurements PT)    Recommendations for Other Services       Precautions / Restrictions Precautions Precautions: Fall Precaution Comments: R leg w/ decreased WB tolerated Restrictions RLE Weight Bearing: Weight bearing as tolerated    Mobility  Bed Mobility   Bed Mobility: Supine to Sit     Supine to sit: Min guard     General bed mobility comments: patient able to get to bed edge without assistance  Transfers Overall transfer level: Needs assistance Equipment used: Rolling walker (2 wheeled) Transfers: Sit to/from [employer] Sit to Stand: Min assist         General transfer comment: Multimodal cues for safe hand placement; min A to steady, stood x 2, Made 2 attempts to take a step to pivot, decreased WB taken on  right leg, causing difficulty to take a step with left. Patient did basically pivot ton left leg.  Ambulation/Gait                 Stairs             Wheelchair Mobility    Modified Rankin (Stroke Patients Only)       Balance Overall balance assessment: Needs assistance Sitting-balance support: No upper extremity supported Sitting balance-Leahy  Scale: Fair     Standing balance support: Bilateral upper extremity supported;During functional activity Standing balance-Leahy Scale: Poor Standing balance comment: Required RW                            Cognition Arousal/Alertness: Awake/alert Behavior During Therapy: WFL for tasks assessed/performed                                          Exercises General Exercises - Lower Extremity Ankle Circles/Pumps: AROM;Both;10 reps Long Arc Quad: AROM;Both;10 reps;Seated    General Comments        Pertinent Vitals/Pain Pain Assessment: Faces Faces Pain Scale: Hurts little more Pain Location: right hip Pain Descriptors / Indicators: Discomfort Pain Intervention(s): Monitored during session;Repositioned    Home Living                      Prior Function            PT Goals (current goals can now be found in the care plan section) Progress towards PT goals: Progressing toward goals    Frequency    Min 3X/week      PT Plan Current plan remains appropriate    Co-evaluation  AM-PAC PT "6 Clicks" Mobility   Outcome Measure  Help needed turning from your back to your side while in a flat bed without using bedrails?: A Little Help needed moving from lying on your back to sitting on the side of a flat bed without using bedrails?: A Little Help needed moving to and from a bed to a chair (including a wheelchair)?: A Lot Help needed standing up from a chair using your arms (e.g., wheelchair or bedside chair)?: A Lot Help needed to walk in hospital room?: Total Help needed climbing 3-5 steps with a railing? : Total 6 Click Score: 12    End of Session Equipment Utilized During Treatment: Gait belt Activity Tolerance: Patient tolerated treatment well Patient left: with chair alarm set;in chair;with call bell/phone within reach Nurse Communication: Mobility status;Other (comment) PT Visit Diagnosis: Unsteadiness on  feet (R26.81);Other abnormalities of gait and mobility (R26.89);Muscle weakness (generalized) (M62.81);History of falling (Z91.81)     Time: 8264-1583 PT Time Calculation (min) (ACUTE ONLY): 22 min  Charges:  $Therapeutic Activity: 8-22 mins                     Blanchard Kelch PT Acute Rehabilitation Services Pager [phone] Office [phone]    Rada Hay 03/06/2020, 3:17 PM

## 2020-03-06 NOTE — Plan of Care (Signed)
POC discussed with pt, pt alert and oriented x3, shows no signs of distress, VSS. No complaints of pain, pain managed with scheduled tylenol. Bed wheels locked, nonskid footwear applied OOB, call bell within reach, encouraged to use call bell for assistance, no falls during shift  Problem: Education: Goal: Knowledge of General Education information will improve Description: Including pain rating scale, medication(s)/side effects and non-pharmacologic comfort measures Outcome: Progressing   Problem: Health Behavior/Discharge Planning: Goal: Ability to manage health-related needs will improve Outcome: Progressing   Problem: Clinical Measurements: Goal: Ability to maintain clinical measurements within normal limits will improve Outcome: Progressing Goal: Will remain free from infection Outcome: Progressing Goal: Diagnostic test results will improve Outcome: Progressing Goal: Respiratory complications will improve Outcome: Progressing Goal: Cardiovascular complication will be avoided Outcome: Progressing   Problem: Activity: Goal: Risk for activity intolerance will decrease Outcome: Progressing   Problem: Nutrition: Goal: Adequate nutrition will be maintained Outcome: Progressing   Problem: Coping: Goal: Level of anxiety will decrease Outcome: Progressing   Problem: Elimination: Goal: Will not experience complications related to bowel motility Outcome: Progressing Goal: Will not experience complications related to urinary retention Outcome: Progressing   Problem: Pain Managment: Goal: General experience of comfort will improve Outcome: Progressing   Problem: Safety: Goal: Ability to remain free from injury will improve Outcome: Progressing   Problem: Skin Integrity: Goal: Risk for impaired skin integrity will decrease Outcome: Progressing   Problem: Education: Goal: Verbalization of understanding the information provided (i.e., activity precautions, restrictions,  etc) will improve Outcome: Progressing Goal: Individualized Educational Video(s) Outcome: Progressing   Problem: Activity: Goal: Ability to ambulate and perform ADLs will improve Outcome: Progressing   Problem: Clinical Measurements: Goal: Postoperative complications will be avoided or minimized Outcome: Progressing   Problem: Self-Concept: Goal: Ability to maintain and perform role responsibilities to the fullest extent possible will improve Outcome: Progressing   Problem: Pain Management: Goal: Pain level will decrease Outcome: Progressing   Problem: Activity: Goal: Ability to ambulate and perform ADLs will improve Outcome: Progressing   Problem: Clinical Measurements: Goal: Postoperative complications will be avoided or minimized Outcome: Progressing   Problem: Pain Management: Goal: Pain level will decrease Outcome: Progressing   Problem: Education: Goal: Knowledge of risk factors and measures for prevention of condition will improve Outcome: Progressing   Problem: Respiratory: Goal: Will maintain a patent airway Outcome: Progressing Goal: Complications related to the disease process, condition or treatment will be avoided or minimized Outcome: Progressing

## 2020-03-07 LAB — GLUCOSE, CAPILLARY
Glucose-Capillary: 131 mg/dL — ABNORMAL HIGH (ref 70–99)
Glucose-Capillary: 203 mg/dL — ABNORMAL HIGH (ref 70–99)
Glucose-Capillary: 130 mg/dL — ABNORMAL HIGH (ref 70–99)
Glucose-Capillary: 136 mg/dL — ABNORMAL HIGH (ref 70–99)

## 2020-03-07 NOTE — Progress Notes (Signed)
Physical Therapy Treatment Patient Details Name: Noah Adkins MRN: [id number] DOB: 09-08-1926 Today's Date: 03/07/2020    History of Present Illness Pt is 86 yo male with PMH including DM, HTN, complete heart block and sick sinus syndrome s/p pacemaker, CAD, and TIA.  He presented to ED s/p fall and found to have R comminuted intertrochanteric hip fx and is s/p IM nail on 03/03/20.  Pt is also COVID positive - fully vaccinated.    PT Comments    Slight improvement in RLE support during stance so able to take a few steps to recliner. Patient  Eager to have breakfast after transfer. Continue  Progressive  Ambulation.  Follow Up Recommendations  SNF     Equipment Recommendations  3in1 (PT);Rolling walker with 5" wheels;Wheelchair cushion (measurements PT);Wheelchair (measurements PT)    Recommendations for Other Services       Precautions / Restrictions Precautions Precautions: Fall Precaution Comments: R leg w/ decreased WB tolerated Restrictions Weight Bearing Restrictions: No RLE Weight Bearing: Weight bearing as tolerated    Mobility  Bed Mobility Overal bed mobility: Needs Assistance Bed Mobility: Supine to Sit           General bed mobility comments: needed a small boost  for trunk, pushing up on Rail  Transfers Overall transfer level: Needs assistance Equipment used: Rolling walker (2 wheeled) Transfers: Sit to/from [employer] Sit to Stand: Min assist Stand pivot transfers: Mod assist       General transfer comment: bed raised. stood at [facility] with min assistance. Able to take 3 small steps to turn to recliner. right LE remains weak and decreased to support during stance.  Ambulation/Gait                 Stairs             Wheelchair Mobility    Modified Rankin (Stroke Patients Only)       Balance Overall balance assessment: History of Falls Sitting-balance support: No upper extremity supported Sitting balance-Leahy  Scale: Fair     Standing balance support: Bilateral upper extremity supported;During functional activity Standing balance-Leahy Scale: Poor                              Cognition Arousal/Alertness: Awake/alert                                            Exercises      General Comments        Pertinent Vitals/Pain Faces Pain Scale: Hurts little more Pain Location: right hip Pain Descriptors / Indicators: Discomfort Pain Intervention(s): Monitored during session    Home Living                      Prior Function            PT Goals (current goals can now be found in the care plan section) Progress towards PT goals: Progressing toward goals    Frequency    Min 3X/week      PT Plan Current plan remains appropriate    Co-evaluation              AM-PAC PT "6 Clicks" Mobility   Outcome Measure  Help needed turning from your back to your side while in a flat bed without  using bedrails?: A Little Help needed moving from lying on your back to sitting on the side of a flat bed without using bedrails?: A Little Help needed moving to and from a bed to a chair (including a wheelchair)?: A Lot Help needed standing up from a chair using your arms (e.g., wheelchair or bedside chair)?: A Lot Help needed to walk in hospital room?: A Lot Help needed climbing 3-5 steps with a railing? : Total 6 Click Score: 13    End of Session Equipment Utilized During Treatment: Gait belt Activity Tolerance: Patient tolerated treatment well Patient left: with chair alarm set;in chair;with call bell/phone within reach Nurse Communication: Mobility status;Other (comment) PT Visit Diagnosis: Unsteadiness on feet (R26.81);Other abnormalities of gait and mobility (R26.89);Muscle weakness (generalized) (M62.81);History of falling (Z91.81)     Time: 0930-1000 PT Time Calculation (min) (ACUTE ONLY): 30 min  Charges:  $Therapeutic Activity: 23-37  mins                     Blanchard Kelch PT Acute Rehabilitation Services Pager [phone] Office [phone]    Rada Hay 03/07/2020, 10:05 AM

## 2020-03-07 NOTE — Care Management Important Message (Signed)
Important Message  Patient Details IM Letter given to the Patient. Name: Noah Adkins MRN: [id number] Date of Birth: [birth year]   Medicare Important Message Given:  Yes     Caren Macadam 03/07/2020, 10:28 AM

## 2020-03-07 NOTE — Progress Notes (Signed)
PROGRESS NOTE  DEAUNDRE ALLSTON [id number]:[id number] DOB: [birth year] DOA: 03/02/2020 PCP: Deatra James, MD   LOS: 5 days   Brief Narrative / Interim history: 86 year old male with history of CKD 3B, DM, HTN, complete heart block status post pacemaker, CAD, prior TIAs came into the hospital with a mechanical fall resulting in a right hip fracture.  Incidentally he was also found to be Covid positive.  Subjective / 24h Interval events: No complaints this morning. No shortness of breath. No flulike illness  Assessment & Plan: Principal Problem Right hip fracture -Orthopedic surgery consulted, patient was taken to the OR and is status post intramedullary fixation of the right femur on 12/6.  Continue Lovenox for DVT prophylaxis in-house then 81 mg twice daily for 6 weeks. SNF placement pending   Active Problems Covid positive -No symptoms, appears to be incidental.  Received monoclonal antibodies on 12/7.  Given lack of symptoms, afebrile, needs total of 10-day isolation. He tested positive on 12/5 and can come off isolation 12/16  Acute kidney injury on chronic kidney disease stage IIIb -His creatinine in 2019 was 1.4-1.8, and around 2.0 in 2017.  Currently his creatinine is 2.0-2.1, I suspect this is his new baseline -Has been on fluids so far, stop, encourage p.o. intake, avoid fluid overload -Renal function stable  Symptomatic bradycardia status post pacemaker -Noted  Mild dementia -Continue Aricept  CAD, hyperlipidemia -Continue statin  Type 2 diabetes mellitus, uncontrolled with hyperglycemia -Continue sliding scale, monitor CBGs  CBG (last 3)  Recent Labs    03/06/20 2017 03/07/20 0751 03/07/20 1139  GLUCAP 122* 131* 203*    Scheduled Meds: . acetaminophen  1,000 mg Oral Q8H  . allopurinol  300 mg Oral Daily  . aspirin  325 mg Oral Daily  . cholecalciferol  1,000 Units Oral Daily  . darifenacin  15 mg Oral Daily  . donepezil  10 mg Oral Daily  . DULoxetine  60 mg Oral  Daily  . enoxaparin (LOVENOX) injection  30 mg Subcutaneous Q24H  . insulin aspart  0-9 Units Subcutaneous TID WC  . oxybutynin  5 mg Oral BID  . pantoprazole  80 mg Oral Daily  . senna-docusate  1 tablet Oral BID  . simvastatin  10 mg Oral QHS   Continuous Infusions:  PRN Meds:.bisacodyl, menthol-cetylpyridinium **OR** phenol, naphazoline-glycerin, ondansetron **OR** ondansetron (ZOFRAN) IV, oxyCODONE  Diet Orders (From admission, onward)    Start     Ordered   03/03/20 2110  Diet Carb Modified Fluid consistency: Thin; Room service appropriate? Yes  Diet effective now       Question Answer Comment  Calorie Level Medium 1600-2000   Fluid consistency: Thin   Room service appropriate? Yes      03/03/20 2109          DVT prophylaxis: enoxaparin (LOVENOX) injection 30 mg Start: 03/04/20 0800 SCDs Start: 03/03/20 2110    Code Status: Full Code  Family Communication: No family at bedside  Status is: Inpatient  Remains inpatient appropriate because:Inpatient level of care appropriate due to severity of illness   Dispo:  Patient From: Home  Planned Disposition: Skilled Nursing Facility  Expected discharge date: 03/10/2020  Medically stable for discharge: Yes  Consultants:  Orthopedic surgery   Procedures:  Right IM nail 12/6  Microbiology  None   Antimicrobials: None     Objective: Vitals:   03/06/20 0442 03/06/20 1432 03/06/20 2013 03/07/20 0517  BP: 137/67 139/81 136/78 140/73  Pulse: 75 79 79 82  Resp: 19 16 20 16   Temp: 97.7 F (36.5 C) 98.1 F (36.7 C) 98 F (36.7 C) 97.9 F (36.6 C)  TempSrc: Oral Oral Oral   SpO2: 95% 96% 96% 96%  Weight:      Height:        Intake/Output Summary (Last 24 hours) at 03/07/2020 1218 Last data filed at 03/07/2020 0935 Gross per 24 hour  Intake 354 ml  Output 525 ml  Net -171 ml   Filed Weights   03/03/20 1629  Weight: 73.2 kg    Examination:  Constitutional: No distress Respiratory: Clear, no  wheezing Cardiovascular: Regular rate and rhythm, no edema   Data Reviewed: I have independently reviewed following labs and imaging studies   CBC: Recent Labs  Lab 03/02/20 0210 03/03/20 0400 03/04/20 0420 03/05/20 0427 03/06/20 0404  WBC 9.3 10.6* 8.8 6.4 5.6  NEUTROABS 8.2*  --   --   --   --   HGB 14.5 13.6 10.2* 10.1* 9.6*  HCT 42.1 40.1 30.1* 29.9* 28.5*  MCV 94.8 95.7 95.6 94.9 96.3  PLT 175 181 162 170 203   Basic Metabolic Panel: Recent Labs  Lab 03/02/20 0210 03/02/20 0759 03/03/20 0400 03/04/20 0420 03/06/20 0404  NA 136  --  136 134* 135  K 3.4*  --  3.8 3.4* 4.0  CL 98  --  98 97* 100  CO2 24  --  24 25 26   GLUCOSE 126*  --  127* 266* 96  BUN 49*  --  50* 55* 49*  CREATININE 2.17*  --  2.06* 2.14* 2.07*  CALCIUM 8.3*  --  8.4* 7.8* 8.1*  MG  --  1.6*  --  1.8  --    Liver Function Tests: Recent Labs  Lab 03/06/20 0404  AST 31  ALT 8  ALKPHOS 67  BILITOT 0.9  PROT 5.5*  ALBUMIN 2.5*   Coagulation Profile: Recent Labs  Lab 03/02/20 0210  INR 1.1   HbA1C: No results for input(s): HGBA1C in the last 72 hours. CBG: Recent Labs  Lab 03/06/20 1130 03/06/20 1631 03/06/20 2017 03/07/20 0751 03/07/20 1139  GLUCAP 134* 152* 122* 131* 203*    Recent Results (from the past 240 hour(s))  Resp Panel by RT-PCR (Flu A&B, Covid) Nasopharyngeal Swab     Status: Abnormal   Collection Time: 03/02/20  2:10 AM   Specimen: Nasopharyngeal Swab; Nasopharyngeal(NP) swabs in vial transport medium  Result Value Ref Range Status   SARS Coronavirus 2 by RT PCR POSITIVE (A) NEGATIVE Final    Comment: CRITICAL RESULT CALLED TO, READ BACK BY AND VERIFIED WITH: 14/10/21 RN 03/02/20@0344  BY P.HENDERSON    Influenza A by PCR NEGATIVE NEGATIVE Final   Influenza B by PCR NEGATIVE NEGATIVE Final    Comment: (NOTE) The Xpert Xpress SARS-CoV-2/FLU/RSV plus assay is intended as an aid in the diagnosis of influenza from Nasopharyngeal swab specimens  and should not be used as a sole basis for treatment. Nasal washings and aspirates are unacceptable for Xpert Xpress SARS-CoV-2/FLU/RSV testing.  Fact Sheet for Patients: Kathlene November  Fact Sheet for Healthcare Providers: 14/05/21  This test is not yet approved or cleared by the [employer] FDA and has been authorized for detection and/or diagnosis of SARS-CoV-2 by FDA under an Emergency Use Authorization (EUA). This EUA will remain in effect (meaning this test can be used) for the duration of the COVID-19 declaration under Section 564(b)(1) of the Act, 21 U.S.C. section 360bbb-3(b)(1), unless the authorization  is terminated or revoked.  Performed at [facility], [address], [place], [place] [address]   Surgical pcr screen     Status: None   Collection Time: 03/03/20 11:50 AM   Specimen: Nasal Mucosa; Nasal Swab  Result Value Ref Range Status   MRSA, PCR NEGATIVE NEGATIVE Final   Staphylococcus aureus NEGATIVE NEGATIVE Final    Comment: (NOTE) The Xpert SA Assay (FDA approved for NASAL specimens in patients 62 years of age and older), is one component of a comprehensive surveillance program. It is not intended to diagnose infection nor to guide or monitor treatment. Performed at [facility], [address] [place], [place] [address]      Radiology Studies: No results found.  Pamella Pert, MD, PhD [facility]  Between 7 am - 7 pm I am available, please contact me via Amion or Securechat  Between 7 pm - 7 am I am not available, please contact night coverage MD/APP via Amion

## 2020-03-08 LAB — CBC
HCT: 29.4 % — ABNORMAL LOW (ref 39.0–52.0)
Hemoglobin: 10.2 g/dL — ABNORMAL LOW (ref 13.0–17.0)
MCH: 32.7 pg (ref 26.0–34.0)
MCHC: 34.7 g/dL (ref 30.0–36.0)
MCV: 94.2 fL (ref 80.0–100.0)
Platelets: 315 10*3/uL (ref 150–400)
RBC: 3.12 MIL/uL — ABNORMAL LOW (ref 4.22–5.81)
RDW: 12.8 % (ref 11.5–15.5)
WBC: 8 10*3/uL (ref 4.0–10.5)
nRBC: 0 % (ref 0.0–0.2)

## 2020-03-08 LAB — BASIC METABOLIC PANEL
Anion gap: 14 (ref 5–15)
BUN: 36 mg/dL — ABNORMAL HIGH (ref 8–23)
CO2: 23 mmol/L (ref 22–32)
Calcium: 8.3 mg/dL — ABNORMAL LOW (ref 8.9–10.3)
Chloride: 98 mmol/L (ref 98–111)
Creatinine, Ser: 1.54 mg/dL — ABNORMAL HIGH (ref 0.61–1.24)
GFR, Estimated: 43 mL/min — ABNORMAL LOW (ref >60)
Glucose, Bld: 115 mg/dL — ABNORMAL HIGH (ref 70–99)
Potassium: 3.8 mmol/L (ref 3.5–5.1)
Sodium: 135 mmol/L (ref 135–145)

## 2020-03-08 LAB — GLUCOSE, CAPILLARY
Glucose-Capillary: 105 mg/dL — ABNORMAL HIGH (ref 70–99)
Glucose-Capillary: 98 mg/dL (ref 70–99)
Glucose-Capillary: 169 mg/dL — ABNORMAL HIGH (ref 70–99)
Glucose-Capillary: 113 mg/dL — ABNORMAL HIGH (ref 70–99)

## 2020-03-08 NOTE — Plan of Care (Signed)
POC discussed with pt, pt alert and oriented x4, shows no signs of distress, VSS. Pain managed with scheduled tylenol. Bed wheels locked, nonskid footwear applied OOB, call bell within reach, encouraged to use call bell for assistance, no falls during shift   Problem: Education: Goal: Knowledge of General Education information will improve Description: Including pain rating scale, medication(s)/side effects and non-pharmacologic comfort measures Outcome: Progressing   Problem: Health Behavior/Discharge Planning: Goal: Ability to manage health-related needs will improve Outcome: Progressing   Problem: Clinical Measurements: Goal: Ability to maintain clinical measurements within normal limits will improve Outcome: Progressing Goal: Will remain free from infection Outcome: Progressing Goal: Diagnostic test results will improve Outcome: Progressing Goal: Respiratory complications will improve Outcome: Progressing Goal: Cardiovascular complication will be avoided Outcome: Progressing   Problem: Activity: Goal: Risk for activity intolerance will decrease Outcome: Progressing   Problem: Nutrition: Goal: Adequate nutrition will be maintained Outcome: Progressing   Problem: Coping: Goal: Level of anxiety will decrease Outcome: Progressing   Problem: Elimination: Goal: Will not experience complications related to bowel motility Outcome: Progressing Goal: Will not experience complications related to urinary retention Outcome: Progressing   Problem: Pain Managment: Goal: General experience of comfort will improve Outcome: Progressing   Problem: Safety: Goal: Ability to remain free from injury will improve Outcome: Progressing   Problem: Skin Integrity: Goal: Risk for impaired skin integrity will decrease Outcome: Progressing   Problem: Education: Goal: Verbalization of understanding the information provided (i.e., activity precautions, restrictions, etc) will  improve Outcome: Progressing Goal: Individualized Educational Video(s) Outcome: Progressing    Problem: Activity: Goal: Ability to ambulate and perform ADLs will improve Outcome: Progressing   Problem: Clinical Measurements: Goal: Postoperative complications will be avoided or minimized Outcome: Progressing   Problem: Self-Concept: Goal: Ability to maintain and perform role responsibilities to the fullest extent possible will improve Outcome: Progressing   Problem: Pain Management: Goal: Pain level will decrease Outcome: Progressing   Problem: Respiratory: Goal: Will maintain a patent airway Outcome: Progressing Goal: Complications related to the disease process, condition or treatment will be avoided or minimized Outcome: Progressing

## 2020-03-08 NOTE — Progress Notes (Signed)
PROGRESS NOTE  Noah Adkins [id number]:[id number] DOB: [birth year] DOA: 03/02/2020 PCP: Deatra James, MD   LOS: 6 days   Brief Narrative / Interim history: 86 year old male with history of CKD 3B, DM, HTN, complete heart block status post pacemaker, CAD, prior TIAs came into the hospital with a mechanical fall resulting in a right hip fracture.  Incidentally he was also found to be Covid positive.  Subjective / 24h Interval events: No complaints, no respiratory symptoms  Assessment & Plan: Principal Problem Right hip fracture -Orthopedic surgery consulted, patient was taken to the OR and is status post intramedullary fixation of the right femur on 12/6.  Continue Lovenox for DVT prophylaxis in-house then 81 mg twice daily for 6 weeks. SNF placement pending   Active Problems Covid positive -No symptoms, appears to be incidental.  Received monoclonal antibodies on 12/7.  Given lack of symptoms, afebrile, needs total of 10-day isolation. He tested positive on 12/5 and can come off isolation 12/16  Acute kidney injury on chronic kidney disease stage IIIb -His creatinine in 2019 was 1.4-1.8, and around 2.0 in 2017.  Currently his creatinine is 2.0-2.1, I suspect this is his new baseline -Has been on fluids so far, stop, encourage p.o. intake, avoid fluid overload -Renal function stable this morning  Symptomatic bradycardia status post pacemaker -Noted  Mild dementia -Continue Aricept  CAD, hyperlipidemia -Continue statin  Type 2 diabetes mellitus, uncontrolled with hyperglycemia -Continue sliding scale, monitor CBGs  CBG (last 3)  Recent Labs    03/07/20 2046 03/08/20 0748 03/08/20 1148  GLUCAP 136* 105* 98    Scheduled Meds: . acetaminophen  1,000 mg Oral Q8H  . allopurinol  300 mg Oral Daily  . aspirin  325 mg Oral Daily  . cholecalciferol  1,000 Units Oral Daily  . darifenacin  15 mg Oral Daily  . donepezil  10 mg Oral Daily  . DULoxetine  60 mg Oral Daily  . enoxaparin  (LOVENOX) injection  30 mg Subcutaneous Q24H  . insulin aspart  0-9 Units Subcutaneous TID WC  . oxybutynin  5 mg Oral BID  . pantoprazole  80 mg Oral Daily  . senna-docusate  1 tablet Oral BID  . simvastatin  10 mg Oral QHS   Continuous Infusions:  PRN Meds:.bisacodyl, menthol-cetylpyridinium **OR** phenol, naphazoline-glycerin, ondansetron **OR** ondansetron (ZOFRAN) IV, oxyCODONE  Diet Orders (From admission, onward)    Start     Ordered   03/03/20 2110  Diet Carb Modified Fluid consistency: Thin; Room service appropriate? Yes  Diet effective now       Question Answer Comment  Calorie Level Medium 1600-2000   Fluid consistency: Thin   Room service appropriate? Yes      03/03/20 2109          DVT prophylaxis: enoxaparin (LOVENOX) injection 30 mg Start: 03/04/20 0800 SCDs Start: 03/03/20 2110    Code Status: Full Code  Family Communication: No family at bedside  Status is: Inpatient  Remains inpatient appropriate because:Inpatient level of care appropriate due to severity of illness  Dispo:  Patient From: Home  Planned Disposition: Skilled Nursing Facility  Expected discharge date: 03/10/2020  Medically stable for discharge: Yes  Consultants:  Orthopedic surgery   Procedures:  Right IM nail 12/6  Microbiology  None   Antimicrobials: None     Objective: Vitals:   03/07/20 0517 03/07/20 1344 03/07/20 2044 03/08/20 0437  BP: 140/73 134/77 (!) 146/79 (!) 145/71  Pulse: 82 81 81 82  Resp: 16 16  20 17  Temp: 97.9 F (36.6 C) 98.2 F (36.8 C) 97.7 F (36.5 C) 98.1 F (36.7 C)  TempSrc:  Oral Oral Oral  SpO2: 96% 98% 97% 95%  Weight:      Height:        Intake/Output Summary (Last 24 hours) at 03/08/2020 1317 Last data filed at 03/08/2020 1025 Gross per 24 hour  Intake 50 ml  Output 900 ml  Net -850 ml   Filed Weights   03/03/20 1629  Weight: 73.2 kg    Examination:  Constitutional: NAD Respiratory: CTA biL Cardiovascular: RRR, no  edema   Data Reviewed: I have independently reviewed following labs and imaging studies   CBC: Recent Labs  Lab 03/02/20 0210 03/03/20 0400 03/04/20 0420 03/05/20 0427 03/06/20 0404 03/08/20 0451  WBC 9.3 10.6* 8.8 6.4 5.6 8.0  NEUTROABS 8.2*  --   --   --   --   --   HGB 14.5 13.6 10.2* 10.1* 9.6* 10.2*  HCT 42.1 40.1 30.1* 29.9* 28.5* 29.4*  MCV 94.8 95.7 95.6 94.9 96.3 94.2  PLT 175 181 162 170 203 315   Basic Metabolic Panel: Recent Labs  Lab 03/02/20 0210 03/02/20 0759 03/03/20 0400 03/04/20 0420 03/06/20 0404 03/08/20 0451  NA 136  --  136 134* 135 135  K 3.4*  --  3.8 3.4* 4.0 3.8  CL 98  --  98 97* 100 98  CO2 24  --  24 25 26 23   GLUCOSE 126*  --  127* 266* 96 115*  BUN 49*  --  50* 55* 49* 36*  CREATININE 2.17*  --  2.06* 2.14* 2.07* 1.54*  CALCIUM 8.3*  --  8.4* 7.8* 8.1* 8.3*  MG  --  1.6*  --  1.8  --   --    Liver Function Tests: Recent Labs  Lab 03/06/20 0404  AST 31  ALT 8  ALKPHOS 67  BILITOT 0.9  PROT 5.5*  ALBUMIN 2.5*   Coagulation Profile: Recent Labs  Lab 03/02/20 0210  INR 1.1   HbA1C: No results for input(s): HGBA1C in the last 72 hours. CBG: Recent Labs  Lab 03/07/20 1139 03/07/20 1702 03/07/20 2046 03/08/20 0748 03/08/20 1148  GLUCAP 203* 130* 136* 105* 98    Recent Results (from the past 240 hour(s))  Resp Panel by RT-PCR (Flu A&B, Covid) Nasopharyngeal Swab     Status: Abnormal   Collection Time: 03/02/20  2:10 AM   Specimen: Nasopharyngeal Swab; Nasopharyngeal(NP) swabs in vial transport medium  Result Value Ref Range Status   SARS Coronavirus 2 by RT PCR POSITIVE (A) NEGATIVE Final    Comment: CRITICAL RESULT CALLED TO, READ BACK BY AND VERIFIED WITH: 14/05/21 RN 03/02/20@0344  BY P.HENDERSON    Influenza A by PCR NEGATIVE NEGATIVE Final   Influenza B by PCR NEGATIVE NEGATIVE Final    Comment: (NOTE) The Xpert Xpress SARS-CoV-2/FLU/RSV plus assay is intended as an aid in the diagnosis of influenza  from Nasopharyngeal swab specimens and should not be used as a sole basis for treatment. Nasal washings and aspirates are unacceptable for Xpert Xpress SARS-CoV-2/FLU/RSV testing.  Fact Sheet for Patients: 14/05/21  Fact Sheet for Healthcare Providers: [employer]  This test is not yet approved or cleared by the [employer] FDA and has been authorized for detection and/or diagnosis of SARS-CoV-2 by FDA under an Emergency Use Authorization (EUA). This EUA will remain in effect (meaning this test can be used) for the duration of the  COVID-19 declaration under Section 564(b)(1) of the Act, 21 U.S.C. section 360bbb-3(b)(1), unless the authorization is terminated or revoked.  Performed at [facility], [address] [place], [place] [address]   Surgical pcr screen     Status: None   Collection Time: 03/03/20 11:50 AM   Specimen: Nasal Mucosa; Nasal Swab  Result Value Ref Range Status   MRSA, PCR NEGATIVE NEGATIVE Final   Staphylococcus aureus NEGATIVE NEGATIVE Final    Comment: (NOTE) The Xpert SA Assay (FDA approved for NASAL specimens in patients 61 years of age and older), is one component of a comprehensive surveillance program. It is not intended to diagnose infection nor to guide or monitor treatment. Performed at [facility], [address], [place], [place] [address]      Radiology Studies: No results found.  Pamella Pert, MD, PhD [facility]  Between 7 am - 7 pm I am available, please contact me via Amion or Securechat  Between 7 pm - 7 am I am not available, please contact night coverage MD/APP via Amion

## 2020-03-09 LAB — GLUCOSE, CAPILLARY
Glucose-Capillary: 118 mg/dL — ABNORMAL HIGH (ref 70–99)
Glucose-Capillary: 112 mg/dL — ABNORMAL HIGH (ref 70–99)
Glucose-Capillary: 141 mg/dL — ABNORMAL HIGH (ref 70–99)
Glucose-Capillary: 161 mg/dL — ABNORMAL HIGH (ref 70–99)

## 2020-03-09 NOTE — Plan of Care (Signed)
POC discussed with pt, pt alert and oriented x4, shows no signs of distress, VSS. No complaints of pain. Pt on room air. Bed wheels locked, nonskid footwear applied OOB, call bell within reach, encouraged t use call bell for assistance, no falls during shift  Problem: Education: Goal: Knowledge of General Education information will improve Description: Including pain rating scale, medication(s)/side effects and non-pharmacologic comfort measures Outcome: Progressing   Problem: Health Behavior/Discharge Planning: Goal: Ability to manage health-related needs will improve Outcome: Progressing   Problem: Clinical Measurements: Goal: Ability to maintain clinical measurements within normal limits will improve Outcome: Progressing Goal: Will remain free from infection Outcome: Progressing Goal: Diagnostic test results will improve Outcome: Progressing Goal: Respiratory complications will improve Outcome: Progressing Goal: Cardiovascular complication will be avoided Outcome: Progressing   Problem: Nutrition: Goal: Adequate nutrition will be maintained Outcome: Progressing   Problem: Coping: Goal: Level of anxiety will decrease Outcome: Progressing   Problem: Elimination: Goal: Will not experience complications related to bowel motility Outcome: Progressing Goal: Will not experience complications related to urinary retention Outcome: Progressing   Problem: Pain Managment: Goal: General experience of comfort will improve Outcome: Progressing   Problem: Safety: Goal: Ability to remain free from injury will improve Outcome: Progressing   Problem: Skin Integrity: Goal: Risk for impaired skin integrity will decrease Outcome: Progressing   Problem: Education: Goal: Verbalization of understanding the information provided (i.e., activity precautions, restrictions, etc) will improve Outcome: Progressing Goal: Individualized Educational Video(s) Outcome: Progressing   Problem:  Activity: Goal: Ability to ambulate and perform ADLs will improve Outcome: Progressing   Problem: Clinical Measurements: Goal: Postoperative complications will be avoided or minimized Outcome: Progressing   Problem: Self-Concept: Goal: Ability to maintain and perform role responsibilities to the fullest extent possible will improve Outcome: Progressing   Problem: Pain Management: Goal: Pain level will decrease Outcome: Progressing   Problem: Activity: Goal: Ability to ambulate and perform ADLs will improve Outcome: Progressing   Problem: Clinical Measurements: Goal: Postoperative complications will be avoided or minimized Outcome: Progressing   Problem: Pain Management: Goal: Pain level will decrease Outcome: Progressing   Problem: Education: Goal: Knowledge of risk factors and measures for prevention of condition will improve Outcome: Progressing   Problem: Respiratory: Goal: Will maintain a patent airway Outcome: Progressing Goal: Complications related to the disease process, condition or treatment will be avoided or minimized Outcome: Progressing

## 2020-03-09 NOTE — Progress Notes (Signed)
PROGRESS NOTE  Noah Adkins:[id number] DOB: Jan 02, 1927 DOA: 03/02/2020 PCP: Deatra James, MD   LOS: 7 days   Brief Narrative / Interim history: 86 year old male with history of CKD 3B, DM, HTN, complete heart block status post pacemaker, CAD, prior TIAs came into the hospital with a mechanical fall resulting in a right hip fracture.  Incidentally he was also found to be Covid positive.  Subjective / 24h Interval events: No complaints, no respiratory symptoms  Assessment & Plan: Principal Problem Right hip fracture -Orthopedic surgery consulted, patient was taken to the OR and is status post intramedullary fixation of the right femur on 12/6.  Continue Lovenox for DVT prophylaxis in-house then 81 mg twice daily for 6 weeks. SNF placement pending   Active Problems Covid positive -No symptoms, appears to be incidental.  Received monoclonal antibodies on 12/7.  Given lack of symptoms, afebrile, needs total of 10-day isolation. He tested positive on 12/5 and can come off isolation 12/16  Acute kidney injury on chronic kidney disease stage IIIb -His creatinine in 2019 was 1.4-1.8, and around 2.0 in 2017.  Currently his creatinine is 2.0-2.1, I suspect this is his new baseline -Has been on fluids so far, stop, encourage p.o. intake, avoid fluid overload -Renal function overall stable  Symptomatic bradycardia status post pacemaker -Noted  Mild dementia -Continue Aricept  CAD, hyperlipidemia -Continue statin  Type 2 diabetes mellitus, uncontrolled with hyperglycemia -Continue sliding scale, monitor CBGs  CBG (last 3)  Recent Labs    03/08/20 1706 03/08/20 2104 03/09/20 0802  GLUCAP 169* 113* 118*    Scheduled Meds: . acetaminophen  1,000 mg Oral Q8H  . allopurinol  300 mg Oral Daily  . aspirin  325 mg Oral Daily  . cholecalciferol  1,000 Units Oral Daily  . darifenacin  15 mg Oral Daily  . donepezil  10 mg Oral Daily  . DULoxetine  60 mg Oral Daily  . enoxaparin  (LOVENOX) injection  30 mg Subcutaneous Q24H  . insulin aspart  0-9 Units Subcutaneous TID WC  . oxybutynin  5 mg Oral BID  . pantoprazole  80 mg Oral Daily  . senna-docusate  1 tablet Oral BID  . simvastatin  10 mg Oral QHS   Continuous Infusions:  PRN Meds:.bisacodyl, menthol-cetylpyridinium **OR** phenol, naphazoline-glycerin, ondansetron **OR** ondansetron (ZOFRAN) IV, oxyCODONE  Diet Orders (From admission, onward)    Start     Ordered   03/03/20 2110  Diet Carb Modified Fluid consistency: Thin; Room service appropriate? Yes  Diet effective now       Question Answer Comment  Calorie Level Medium 1600-2000   Fluid consistency: Thin   Room service appropriate? Yes      03/03/20 2109          DVT prophylaxis: enoxaparin (LOVENOX) injection 30 mg Start: 03/04/20 0800 SCDs Start: 03/03/20 2110    Code Status: Full Code  Family Communication: No family at bedside, discussed with wife over the phone 12/11  Status is: Inpatient  Remains inpatient appropriate because:Inpatient level of care appropriate due to severity of illness  Dispo:  Patient From: Home  Planned Disposition: Skilled Nursing Facility  Expected discharge date: 03/10/2020  Medically stable for discharge: Yes  Consultants:  Orthopedic surgery   Procedures:  Right IM nail 12/6  Microbiology  None   Antimicrobials: None     Objective: Vitals:   03/08/20 0437 03/08/20 1414 03/08/20 2102 03/09/20 0522  BP: (!) 145/71 (!) 154/81 134/71 (!) 148/74  Pulse: 82  95 83 93  Resp: 17 16 (!) 23 (!) 22  Temp: 98.1 F (36.7 C) (!) 97.1 F (36.2 C) 97.9 F (36.6 C) 97.6 F (36.4 C)  TempSrc: Oral  Oral Oral  SpO2: 95% 96% 94% 96%  Weight:      Height:        Intake/Output Summary (Last 24 hours) at 03/09/2020 0923 Last data filed at 03/09/2020 0500 Gross per 24 hour  Intake 240 ml  Output 1200 ml  Net -960 ml   Filed Weights   03/03/20 1629  Weight: 73.2 kg     Examination:  Constitutional: NAD Respiratory: CTA Cardiovascular: RRR, no edema   Data Reviewed: I have independently reviewed following labs and imaging studies   CBC: Recent Labs  Lab 03/03/20 0400 03/04/20 0420 03/05/20 0427 03/06/20 0404 03/08/20 0451  WBC 10.6* 8.8 6.4 5.6 8.0  HGB 13.6 10.2* 10.1* 9.6* 10.2*  HCT 40.1 30.1* 29.9* 28.5* 29.4*  MCV 95.7 95.6 94.9 96.3 94.2  PLT 181 162 170 203 315   Basic Metabolic Panel: Recent Labs  Lab 03/03/20 0400 03/04/20 0420 03/06/20 0404 03/08/20 0451  NA 136 134* 135 135  K 3.8 3.4* 4.0 3.8  CL 98 97* 100 98  CO2 24 25 26 23   GLUCOSE 127* 266* 96 115*  BUN 50* 55* 49* 36*  CREATININE 2.06* 2.14* 2.07* 1.54*  CALCIUM 8.4* 7.8* 8.1* 8.3*  MG  --  1.8  --   --    Liver Function Tests: Recent Labs  Lab 03/06/20 0404  AST 31  ALT 8  ALKPHOS 67  BILITOT 0.9  PROT 5.5*  ALBUMIN 2.5*   Coagulation Profile: No results for input(s): INR, PROTIME in the last 168 hours. HbA1C: No results for input(s): HGBA1C in the last 72 hours. CBG: Recent Labs  Lab 03/08/20 0748 03/08/20 1148 03/08/20 1706 03/08/20 2104 03/09/20 0802  GLUCAP 105* 98 169* 113* 118*    Recent Results (from the past 240 hour(s))  Resp Panel by RT-PCR (Flu A&B, Covid) Nasopharyngeal Swab     Status: Abnormal   Collection Time: 03/02/20  2:10 AM   Specimen: Nasopharyngeal Swab; Nasopharyngeal(NP) swabs in vial transport medium  Result Value Ref Range Status   SARS Coronavirus 2 by RT PCR POSITIVE (A) NEGATIVE Final    Comment: CRITICAL RESULT CALLED TO, READ BACK BY AND VERIFIED WITH: JAKE TALKINGTON RN 03/02/20@0344  BY P.HENDERSON    Influenza A by PCR NEGATIVE NEGATIVE Final   Influenza B by PCR NEGATIVE NEGATIVE Final    Comment: (NOTE) The Xpert Xpress SARS-CoV-2/FLU/RSV plus assay is intended as an aid in the diagnosis of influenza from Nasopharyngeal swab specimens and should not be used as a sole basis for treatment. Nasal  washings and aspirates are unacceptable for Xpert Xpress SARS-CoV-2/FLU/RSV testing.  Fact Sheet for Patients: 14/05/21  Fact Sheet for Healthcare Providers: BloggerCourse.com  This test is not yet approved or cleared by the [employer] FDA and has been authorized for detection and/or diagnosis of SARS-CoV-2 by FDA under an Emergency Use Authorization (EUA). This EUA will remain in effect (meaning this test can be used) for the duration of the COVID-19 declaration under Section 564(b)(1) of the Act, 21 U.S.C. section 360bbb-3(b)(1), unless the authorization is terminated or revoked.  Performed at [facility], [address], [place], [place]   Surgical pcr screen     Status: None   Collection Time: 03/03/20 11:50 AM   Specimen: Nasal Mucosa;  Nasal Swab  Result Value Ref Range Status   MRSA, PCR NEGATIVE NEGATIVE Final   Staphylococcus aureus NEGATIVE NEGATIVE Final    Comment: (NOTE) The Xpert SA Assay (FDA approved for NASAL specimens in patients 50 years of age and older), is one component of a comprehensive surveillance program. It is not intended to diagnose infection nor to guide or monitor treatment. Performed at [facility], [address], [place], [place] [address]      Radiology Studies: No results found.  Pamella Pert, MD, PhD [facility]  Between 7 am - 7 pm I am available, please contact me via Amion or Securechat  Between 7 pm - 7 am I am not available, please contact night coverage MD/APP via Amion

## 2020-03-10 LAB — CBC
HCT: 29.6 % — ABNORMAL LOW (ref 39.0–52.0)
Hemoglobin: 9.9 g/dL — ABNORMAL LOW (ref 13.0–17.0)
MCH: 31.9 pg (ref 26.0–34.0)
MCHC: 33.4 g/dL (ref 30.0–36.0)
MCV: 95.5 fL (ref 80.0–100.0)
Platelets: 411 10*3/uL — ABNORMAL HIGH (ref 150–400)
RBC: 3.1 MIL/uL — ABNORMAL LOW (ref 4.22–5.81)
RDW: 13 % (ref 11.5–15.5)
WBC: 12.2 10*3/uL — ABNORMAL HIGH (ref 4.0–10.5)
nRBC: 0 % (ref 0.0–0.2)

## 2020-03-10 LAB — BASIC METABOLIC PANEL
Anion gap: 15 (ref 5–15)
BUN: 34 mg/dL — ABNORMAL HIGH (ref 8–23)
CO2: 23 mmol/L (ref 22–32)
Calcium: 8.4 mg/dL — ABNORMAL LOW (ref 8.9–10.3)
Chloride: 95 mmol/L — ABNORMAL LOW (ref 98–111)
Creatinine, Ser: 1.51 mg/dL — ABNORMAL HIGH (ref 0.61–1.24)
GFR, Estimated: 44 mL/min — ABNORMAL LOW (ref >60)
Glucose, Bld: 144 mg/dL — ABNORMAL HIGH (ref 70–99)
Potassium: 4.1 mmol/L (ref 3.5–5.1)
Sodium: 133 mmol/L — ABNORMAL LOW (ref 135–145)

## 2020-03-10 LAB — C-REACTIVE PROTEIN: CRP: 21 mg/dL — ABNORMAL HIGH (ref <1.0)

## 2020-03-10 LAB — GLUCOSE, CAPILLARY
Glucose-Capillary: 137 mg/dL — ABNORMAL HIGH (ref 70–99)
Glucose-Capillary: 123 mg/dL — ABNORMAL HIGH (ref 70–99)
Glucose-Capillary: 142 mg/dL — ABNORMAL HIGH (ref 70–99)
Glucose-Capillary: 156 mg/dL — ABNORMAL HIGH (ref 70–99)

## 2020-03-10 NOTE — Plan of Care (Signed)
  Problem: Education: Goal: Knowledge of General Education information will improve Description: Including pain rating scale, medication(s)/side effects and non-pharmacologic comfort measures Outcome: Progressing   Problem: Health Behavior/Discharge Planning: Goal: Ability to manage health-related needs will improve Outcome: Progressing   Problem: Clinical Measurements: Goal: Ability to maintain clinical measurements within normal limits will improve Outcome: Progressing Goal: Will remain free from infection Outcome: Progressing Goal: Diagnostic test results will improve Outcome: Progressing Goal: Respiratory complications will improve Outcome: Progressing Goal: Cardiovascular complication will be avoided Outcome: Progressing   Problem: Activity: Goal: Risk for activity intolerance will decrease Outcome: Progressing   Problem: Nutrition: Goal: Adequate nutrition will be maintained Outcome: Progressing   Problem: Coping: Goal: Level of anxiety will decrease Outcome: Progressing   Problem: Elimination: Goal: Will not experience complications related to bowel motility Outcome: Progressing Goal: Will not experience complications related to urinary retention Outcome: Progressing   Problem: Pain Managment: Goal: General experience of comfort will improve Outcome: Progressing   Problem: Safety: Goal: Ability to remain free from injury will improve Outcome: Progressing   Problem: Skin Integrity: Goal: Risk for impaired skin integrity will decrease Outcome: Progressing   Problem: Education: Goal: Verbalization of understanding the information provided (i.e., activity precautions, restrictions, etc) will improve Outcome: Progressing Goal: Individualized Educational Video(s) Outcome: Progressing   Problem: Activity: Goal: Ability to ambulate and perform ADLs will improve Outcome: Progressing   Problem: Clinical Measurements: Goal: Postoperative complications will be  avoided or minimized Outcome: Progressing   Problem: Self-Concept: Goal: Ability to maintain and perform role responsibilities to the fullest extent possible will improve Outcome: Progressing   Problem: Pain Management: Goal: Pain level will decrease Outcome: Progressing   Problem: Education: Goal: Knowledge of risk factors and measures for prevention of condition will improve Outcome: Progressing   Problem: Respiratory: Goal: Will maintain a patent airway Outcome: Progressing Goal: Complications related to the disease process, condition or treatment will be avoided or minimized Outcome: Progressing   Problem: Activity: Goal: Ability to ambulate and perform ADLs will improve Outcome: Progressing   Problem: Clinical Measurements: Goal: Postoperative complications will be avoided or minimized Outcome: Progressing   Problem: Pain Management: Goal: Pain level will decrease Outcome: Progressing

## 2020-03-10 NOTE — TOC Progression Note (Signed)
Transition of Care [facility]) - Progression Note    Patient Details  Name: Noah Adkins MRN: [id number] Date of Birth: 12-Jul-1926  Transition of Care [facility]) CM/SW Contact  Ida Rogue, [place] Phone Number: 03/10/2020, 2:42 PM  Clinical Narrative:   Patient information sent to [facility] in [place] to review for possible admission. TOC will continue to follow during the course of hospitalization.     Expected Discharge Plan: Skilled Nursing Facility Barriers to Discharge: SNF Pending bed offer  Expected Discharge Plan and Services Expected Discharge Plan: Skilled Nursing Facility   Discharge Planning Services: CM Consult Post Acute Care Choice: Skilled Nursing Facility Living arrangements for the past 2 months: Apartment                                       Social Determinants of Health (SDOH) Interventions    Readmission Risk Interventions No flowsheet data found.

## 2020-03-10 NOTE — Care Management Important Message (Signed)
Important Message  Patient Details IM Letter given to the Patient. Name: Noah Adkins MRN: [id number] Date of Birth: [birth year]   Medicare Important Message Given:  Yes     Caren Macadam 03/10/2020, 11:49 AM

## 2020-03-10 NOTE — Plan of Care (Signed)
POC discussed with pt, pt alert and oriented x4, shows no signs of distress, VSS. Pain managed with scheduled tylenol and prn pain medications (see MAR). Bed wheels locked, nonskid footwear applied OOB, call bell within reach, encouraged to use call bell for assistance, no falls during shift  Problem: Education: Goal: Knowledge of General Education information will improve Description: Including pain rating scale, medication(s)/side effects and non-pharmacologic comfort measures Outcome: Progressing   Problem: Health Behavior/Discharge Planning: Goal: Ability to manage health-related needs will improve Outcome: Progressing   Problem: Clinical Measurements: Goal: Ability to maintain clinical measurements within normal limits will improve Outcome: Progressing Goal: Will remain free from infection Outcome: Progressing Goal: Diagnostic test results will improve Outcome: Progressing Goal: Respiratory complications will improve Outcome: Progressing Goal: Cardiovascular complication will be avoided Outcome: Progressing   Problem: Activity: Goal: Risk for activity intolerance will decrease Outcome: Progressing   Problem: Nutrition: Goal: Adequate nutrition will be maintained Outcome: Progressing   Problem: Coping: Goal: Level of anxiety will decrease Outcome: Progressing   Problem: Elimination: Goal: Will not experience complications related to bowel motility Outcome: Progressing Goal: Will not experience complications related to urinary retention Outcome: Progressing   Problem: Pain Managment: Goal: General experience of comfort will improve Outcome: Progressing   Problem: Safety: Goal: Ability to remain free from injury will improve Outcome: Progressing   Problem: Skin Integrity: Goal: Risk for impaired skin integrity will decrease Outcome: Progressing   Problem: Education: Goal: Verbalization of understanding the information provided (i.e., activity precautions,  restrictions, etc) will improve Outcome: Progressing Goal: Individualized Educational Video(s) Outcome: Progressing   Problem: Activity: Goal: Ability to ambulate and perform ADLs will improve Outcome: Progressing   Problem: Clinical Measurements: Goal: Postoperative complications will be avoided or minimized Outcome: Progressing   Problem: Self-Concept: Goal: Ability to maintain and perform role responsibilities to the fullest extent possible will improve Outcome: Progressing   Problem: Pain Management: Goal: Pain level will decrease Outcome: Progressing   Problem: Activity: Goal: Ability to ambulate and perform ADLs will improve Outcome: Progressing   Problem: Clinical Measurements: Goal: Postoperative complications will be avoided or minimized Outcome: Progressing   Problem: Pain Management: Goal: Pain level will decrease Outcome: Progressing   Problem: Education: Goal: Knowledge of risk factors and measures for prevention of condition will improve Outcome: Progressing   Problem: Respiratory: Goal: Will maintain a patent airway Outcome: Progressing Goal: Complications related to the disease process, condition or treatment will be avoided or minimized Outcome: Progressing

## 2020-03-10 NOTE — Progress Notes (Signed)
Physical Therapy Treatment Patient Details Name: Noah Adkins MRN: [id number] DOB: 20-May-1926 Today's Date: 03/10/2020    History of Present Illness Pt is 86 yo male with PMH including DM, HTN, complete heart block and sick sinus syndrome s/p pacemaker, CAD, and TIA.  He presented to ED s/p fall and found to have R comminuted intertrochanteric hip fx and is s/p IM nail on 03/03/20.  Pt is also COVID positive - fully vaccinated.    PT Comments    Pt requires increased time with bed mobility, but no physical assist, slightly unsteady until BLE planted on floor. Pt tolerates seated LAQ well, but increased pain with marching only tolerating 5 reps. Pt able to power up, cued for pushing through bil flat feet to activate quads and hamstrings, along with activating glutes and upright posture with fair carryover. Pt able to take a few sidesteps to [facility] with cues and assist, but increased RLE pain and minimal weight-shift onto R leg limiting gait. Assisted pt back into supine with all needs in reach. Pt will benefit from continued physical therapy in hospital and recommendations below to increase strength, balance, endurance for safe ADLs and gait.   Follow Up Recommendations  SNF     Equipment Recommendations  3in1 (PT);Rolling walker with 5" wheels;Wheelchair cushion (measurements PT);Wheelchair (measurements PT)    Recommendations for Other Services       Precautions / Restrictions Precautions Precautions: Fall Restrictions Weight Bearing Restrictions: No RLE Weight Bearing: Weight bearing as tolerated    Mobility  Bed Mobility Overal bed mobility: Needs Assistance Bed Mobility: Supine to Sit;Sit to Supine  Supine to sit: Min guard Sit to supine: Min guard   General bed mobility comments: slow, labored movement with increased time, no physical assist but verbal cues for hand placement and sequencing to reduce pain  Transfers Overall transfer level: Needs assistance Equipment used:  Rolling walker (2 wheeled) Transfers: Sit to/from Stand Sit to Stand: Mod assist;+2 physical assistance    General transfer comment: mod A +2 to power up from EOB, cues for quad and glute activation, weight-shifted to LLE  Ambulation/Gait Ambulation/Gait assistance: Min assist  Assistive device: Rolling walker (2 wheeled)  General Gait Details: minimal weight-shift to RLE, tolerates 3 small sidesteps L to [facility] before requiring seated rest break, cues for upright posture   Stairs             Wheelchair Mobility    Modified Rankin (Stroke Patients Only)       Balance Overall balance assessment: History of Falls;Needs assistance Sitting-balance support: No upper extremity supported Sitting balance-Leahy Scale: Fair  Standing balance support: Bilateral upper extremity supported;During functional activity Standing balance-Leahy Scale: Poor Standing balance comment: reliant on RW and support           Cognition Arousal/Alertness: Awake/alert Behavior During Therapy: WFL for tasks assessed/performed Overall Cognitive Status: Within Functional Limits for tasks assessed         Exercises General Exercises - Lower Extremity Long Arc Quad: AROM;Strengthening;Right;10 reps;Seated Hip Flexion/Marching: AROM;Strengthening;Right;5 reps;Seated    General Comments General comments (skin integrity, edema, etc.): on RA with SpO2 >92% with mobility      Pertinent Vitals/Pain Pain Assessment: 0-10 Pain Score:  (1/10 at rest, 4/10 with sit to supine, 6/10 with standing and sidesteps) Pain Location: R hip Pain Descriptors / Indicators: Aching;Sore Pain Intervention(s): Limited activity within patient's tolerance;Monitored during session;Repositioned;RN gave pain meds during session    Home Living  Prior Function            PT Goals (current goals can now be found in the care plan section) Acute Rehab PT Goals Patient Stated Goal: return to  walking PT Goal Formulation: With patient Time For Goal Achievement: 03/18/20 Potential to Achieve Goals: Good Progress towards PT goals: Progressing toward goals    Frequency    Min 3X/week      PT Plan Current plan remains appropriate    Co-evaluation              AM-PAC PT "6 Clicks" Mobility   Outcome Measure  Help needed turning from your back to your side while in a flat bed without using bedrails?: A Little Help needed moving from lying on your back to sitting on the side of a flat bed without using bedrails?: A Little Help needed moving to and from a bed to a chair (including a wheelchair)?: A Lot Help needed standing up from a chair using your arms (e.g., wheelchair or bedside chair)?: A Lot Help needed to walk in hospital room?: A Lot Help needed climbing 3-5 steps with a railing? : Total 6 Click Score: 13    End of Session Equipment Utilized During Treatment: Gait belt Activity Tolerance: Patient tolerated treatment well Patient left: in bed;with call bell/phone within reach;with bed alarm set Nurse Communication: Mobility status PT Visit Diagnosis: Unsteadiness on feet (R26.81);Other abnormalities of gait and mobility (R26.89);Muscle weakness (generalized) (M62.81);History of falling (Z91.81)     Time: 2876-8115 PT Time Calculation (min) (ACUTE ONLY): 17 min  Charges:  $Therapeutic Activity: 8-22 mins                      Tori Tere Mcconaughey PT, DPT 03/10/20, 3:36 PM

## 2020-03-10 NOTE — TOC Progression Note (Signed)
Transition of Care [facility]) - Progression Note    Patient Details  Name: Noah Adkins MRN: [id number] Date of Birth: November 10, 1926  Transition of [facility]) CM/SW Contact  Ida Rogue, [place] Phone Number: 03/10/2020, 3:56 PM  Clinical Narrative:   Patient received bed offers from both [facility] and [facility] in [place].  Confirmed with wife that she wanted him to stay in [place] at [place].  Contacted Kitty to ask her to initiate insurance authorization. TOC will continue to follow during the course of hospitalization.     Expected Discharge Plan: Skilled Nursing Facility Barriers to Discharge: SNF Pending bed offer  Expected Discharge Plan and Services Expected Discharge Plan: Skilled Nursing Facility   Discharge Planning Services: CM Consult Post Acute Care Choice: Skilled Nursing Facility Living arrangements for the past 2 months: Apartment                                       Social Determinants of Health (SDOH) Interventions    Readmission Risk Interventions No flowsheet data found.

## 2020-03-10 NOTE — Progress Notes (Signed)
PROGRESS NOTE  Noah Adkins NTZ:[id number] DOB: [birth year] DOA: 03/02/2020 PCP: Deatra James, MD   LOS: 8 days   Brief Narrative / Interim history: 86 year old male with history of CKD 3B, DM, HTN, complete heart block status post pacemaker, CAD, prior TIAs came into the hospital with a mechanical fall resulting in a right hip fracture.  Incidentally he was also found to be Covid positive.  Subjective / 24h Interval events: No distress, no shortness of breath, no chest pain, no abdominal pain, no nausea or vomiting  Assessment & Plan: Principal Problem Right hip fracture -Orthopedic surgery consulted, patient was taken to the OR and is status post intramedullary fixation of the right femur on 12/6.  Continue Lovenox for DVT prophylaxis in-house then 81 mg twice daily for 6 weeks. SNF placement pending   Active Problems Covid positive -No symptoms, appears to be incidental.  Received monoclonal antibodies on 12/7.  Given lack of symptoms, afebrile, needs total of 10-day isolation. He tested positive on 12/5 and can come off isolation 12/16 -Remains asymptomatic despite CRP elevation  Acute kidney injury on chronic kidney disease stage IIIb -His creatinine in 2019 was 1.4-1.8, and around 2.0 in 2017.  Currently his creatinine is 2.0-2.1, I suspect this is his new baseline -Has been on fluids so far, stop, encourage p.o. intake, avoid fluid overload -Renal function stable  Symptomatic bradycardia status post pacemaker -Noted  Mild dementia -Continue Aricept  CAD, hyperlipidemia -Continue statin  Type 2 diabetes mellitus, uncontrolled with hyperglycemia -Continue sliding scale, monitor CBGs  CBG (last 3)  Recent Labs    03/09/20 1656 03/09/20 1955 03/10/20 0723  GLUCAP 141* 161* 137*    Scheduled Meds: . acetaminophen  1,000 mg Oral Q8H  . allopurinol  300 mg Oral Daily  . aspirin  325 mg Oral Daily  . cholecalciferol  1,000 Units Oral Daily  . darifenacin  15 mg  Oral Daily  . donepezil  10 mg Oral Daily  . DULoxetine  60 mg Oral Daily  . enoxaparin (LOVENOX) injection  30 mg Subcutaneous Q24H  . insulin aspart  0-9 Units Subcutaneous TID WC  . oxybutynin  5 mg Oral BID  . pantoprazole  80 mg Oral Daily  . senna-docusate  1 tablet Oral BID  . simvastatin  10 mg Oral QHS   Continuous Infusions:  PRN Meds:.bisacodyl, menthol-cetylpyridinium **OR** phenol, naphazoline-glycerin, ondansetron **OR** ondansetron (ZOFRAN) IV, oxyCODONE  Diet Orders (From admission, onward)    Start     Ordered   03/03/20 2110  Diet Carb Modified Fluid consistency: Thin; Room service appropriate? Yes  Diet effective now       Question Answer Comment  Calorie Level Medium 1600-2000   Fluid consistency: Thin   Room service appropriate? Yes      03/03/20 2109          DVT prophylaxis: enoxaparin (LOVENOX) injection 30 mg Start: 03/04/20 0800 SCDs Start: 03/03/20 2110    Code Status: Full Code  Family Communication: No family at bedside, discussed with wife over the phone 12/11  Status is: Inpatient  Remains inpatient appropriate because:Inpatient level of care appropriate due to severity of illness  Dispo:  Patient From: Home  Planned Disposition: Skilled Nursing Facility  Expected discharge date: 03/10/2020  Medically stable for discharge: Yes  Consultants:  Orthopedic surgery   Procedures:  Right IM nail 12/6  Microbiology  None   Antimicrobials: None     Objective: Vitals:   03/09/20 0522 03/09/20 1429 03/09/20  1958 03/10/20 0350  BP: (!) 148/74 132/72 (!) 144/73 (!) 141/70  Pulse: 93 86 84 82  Resp: (!) 22 14 (!) 21 18  Temp: 97.6 F (36.4 C) 97.6 F (36.4 C) 97.9 F (36.6 C) 98.1 F (36.7 C)  TempSrc: Oral   Oral  SpO2: 96% 94% 96% 96%  Weight:      Height:        Intake/Output Summary (Last 24 hours) at 03/10/2020 1050 Last data filed at 03/09/2020 1345 Gross per 24 hour  Intake 0 ml  Output 400 ml  Net -400 ml    Filed Weights   03/03/20 1629  Weight: 73.2 kg    Examination:  Constitutional: NAD Respiratory: CTA Cardiovascular: RRR, no edema   Data Reviewed: I have independently reviewed following labs and imaging studies   CBC: Recent Labs  Lab 03/04/20 0420 03/05/20 0427 03/06/20 0404 03/08/20 0451 03/10/20 0348  WBC 8.8 6.4 5.6 8.0 12.2*  HGB 10.2* 10.1* 9.6* 10.2* 9.9*  HCT 30.1* 29.9* 28.5* 29.4* 29.6*  MCV 95.6 94.9 96.3 94.2 95.5  PLT 162 170 203 315 411*   Basic Metabolic Panel: Recent Labs  Lab 03/04/20 0420 03/06/20 0404 03/08/20 0451 03/10/20 0348  NA 134* 135 135 133*  K 3.4* 4.0 3.8 4.1  CL 97* 100 98 95*  CO2 25 26 23 23   GLUCOSE 266* 96 115* 144*  BUN 55* 49* 36* 34*  CREATININE 2.14* 2.07* 1.54* 1.51*  CALCIUM 7.8* 8.1* 8.3* 8.4*  MG 1.8  --   --   --    Liver Function Tests: Recent Labs  Lab 03/06/20 0404  AST 31  ALT 8  ALKPHOS 67  BILITOT 0.9  PROT 5.5*  ALBUMIN 2.5*   Coagulation Profile: No results for input(s): INR, PROTIME in the last 168 hours. HbA1C: No results for input(s): HGBA1C in the last 72 hours. CBG: Recent Labs  Lab 03/09/20 0802 03/09/20 1156 03/09/20 1656 03/09/20 1955 03/10/20 0723  GLUCAP 118* 112* 141* 161* 137*    Recent Results (from the past 240 hour(s))  Resp Panel by RT-PCR (Flu A&B, Covid) Nasopharyngeal Swab     Status: Abnormal   Collection Time: 03/02/20  2:10 AM   Specimen: Nasopharyngeal Swab; Nasopharyngeal(NP) swabs in vial transport medium  Result Value Ref Range Status   SARS Coronavirus 2 by RT PCR POSITIVE (A) NEGATIVE Final    Comment: CRITICAL RESULT CALLED TO, READ BACK BY AND VERIFIED WITH: 14/05/21 RN 03/02/20@0344  BY P.HENDERSON    Influenza A by PCR NEGATIVE NEGATIVE Final   Influenza B by PCR NEGATIVE NEGATIVE Final    Comment: (NOTE) The Xpert Xpress SARS-CoV-2/FLU/RSV plus assay is intended as an aid in the diagnosis of influenza from Nasopharyngeal swab specimens  and should not be used as a sole basis for treatment. Nasal washings and aspirates are unacceptable for Xpert Xpress SARS-CoV-2/FLU/RSV testing.  Fact Sheet for Patients: 14/05/21  Fact Sheet for Healthcare Providers: BloggerCourse.com  This test is not yet approved or cleared by the [employer] FDA and has been authorized for detection and/or diagnosis of SARS-CoV-2 by FDA under an Emergency Use Authorization (EUA). This EUA will remain in effect (meaning this test can be used) for the duration of the COVID-19 declaration under Section 564(b)(1) of the Act, 21 U.S.C. section 360bbb-3(b)(1), unless the authorization is terminated or revoked.  Performed at [facility], [address], [place], [place]   Surgical pcr screen  Status: None   Collection Time: 03/03/20 11:50 AM   Specimen: Nasal Mucosa; Nasal Swab  Result Value Ref Range Status   MRSA, PCR NEGATIVE NEGATIVE Final   Staphylococcus aureus NEGATIVE NEGATIVE Final    Comment: (NOTE) The Xpert SA Assay (FDA approved for NASAL specimens in patients 64 years of age and older), is one component of a comprehensive surveillance program. It is not intended to diagnose infection nor to guide or monitor treatment. Performed at [facility], [address] [place], [place] [address]      Radiology Studies: No results found.  Pamella Pert, MD, PhD [facility]  Between 7 am - 7 pm I am available, please contact me via Amion or Securechat  Between 7 pm - 7 am I am not available, please contact night coverage MD/APP via Amion

## 2020-03-11 DIAGNOSIS — S72141A Displaced intertrochanteric fracture of right femur, initial encounter for closed fracture: Secondary | ICD-10-CM | POA: Diagnosis not present

## 2020-03-11 DIAGNOSIS — R0902 Hypoxemia: Secondary | ICD-10-CM | POA: Diagnosis not present

## 2020-03-11 DIAGNOSIS — M2550 Pain in unspecified joint: Secondary | ICD-10-CM | POA: Diagnosis not present

## 2020-03-11 DIAGNOSIS — F339 Major depressive disorder, recurrent, unspecified: Secondary | ICD-10-CM | POA: Diagnosis not present

## 2020-03-11 DIAGNOSIS — E118 Type 2 diabetes mellitus with unspecified complications: Secondary | ICD-10-CM | POA: Diagnosis not present

## 2020-03-11 DIAGNOSIS — M810 Age-related osteoporosis without current pathological fracture: Secondary | ICD-10-CM | POA: Diagnosis not present

## 2020-03-11 DIAGNOSIS — Z9181 History of falling: Secondary | ICD-10-CM | POA: Diagnosis not present

## 2020-03-11 DIAGNOSIS — U071 COVID-19: Secondary | ICD-10-CM | POA: Diagnosis not present

## 2020-03-11 DIAGNOSIS — M6281 Muscle weakness (generalized): Secondary | ICD-10-CM | POA: Diagnosis not present

## 2020-03-11 DIAGNOSIS — Z23 Encounter for immunization: Secondary | ICD-10-CM | POA: Diagnosis not present

## 2020-03-11 DIAGNOSIS — S72009S Fracture of unspecified part of neck of unspecified femur, sequela: Secondary | ICD-10-CM | POA: Diagnosis not present

## 2020-03-11 DIAGNOSIS — N1832 Chronic kidney disease, stage 3b: Secondary | ICD-10-CM | POA: Diagnosis not present

## 2020-03-11 DIAGNOSIS — Z7401 Bed confinement status: Secondary | ICD-10-CM | POA: Diagnosis not present

## 2020-03-11 DIAGNOSIS — R1312 Dysphagia, oropharyngeal phase: Secondary | ICD-10-CM | POA: Diagnosis not present

## 2020-03-11 DIAGNOSIS — F0390 Unspecified dementia without behavioral disturbance: Secondary | ICD-10-CM | POA: Diagnosis not present

## 2020-03-11 DIAGNOSIS — E1122 Type 2 diabetes mellitus with diabetic chronic kidney disease: Secondary | ICD-10-CM | POA: Diagnosis not present

## 2020-03-11 DIAGNOSIS — I69391 Dysphagia following cerebral infarction: Secondary | ICD-10-CM | POA: Diagnosis not present

## 2020-03-11 DIAGNOSIS — D649 Anemia, unspecified: Secondary | ICD-10-CM | POA: Diagnosis not present

## 2020-03-11 DIAGNOSIS — F5101 Primary insomnia: Secondary | ICD-10-CM | POA: Diagnosis not present

## 2020-03-11 DIAGNOSIS — S72101D Unspecified trochanteric fracture of right femur, subsequent encounter for closed fracture with routine healing: Secondary | ICD-10-CM | POA: Diagnosis not present

## 2020-03-11 DIAGNOSIS — S72001S Fracture of unspecified part of neck of right femur, sequela: Secondary | ICD-10-CM | POA: Diagnosis not present

## 2020-03-11 DIAGNOSIS — Y92002 Bathroom of unspecified non-institutional (private) residence single-family (private) house as the place of occurrence of the external cause: Secondary | ICD-10-CM | POA: Diagnosis not present

## 2020-03-11 DIAGNOSIS — R2681 Unsteadiness on feet: Secondary | ICD-10-CM | POA: Diagnosis not present

## 2020-03-11 DIAGNOSIS — I499 Cardiac arrhythmia, unspecified: Secondary | ICD-10-CM | POA: Diagnosis not present

## 2020-03-11 DIAGNOSIS — S72001A Fracture of unspecified part of neck of right femur, initial encounter for closed fracture: Secondary | ICD-10-CM | POA: Diagnosis not present

## 2020-03-11 DIAGNOSIS — Z8673 Personal history of transient ischemic attack (TIA), and cerebral infarction without residual deficits: Secondary | ICD-10-CM | POA: Diagnosis not present

## 2020-03-11 DIAGNOSIS — S72141D Displaced intertrochanteric fracture of right femur, subsequent encounter for closed fracture with routine healing: Secondary | ICD-10-CM | POA: Diagnosis not present

## 2020-03-11 DIAGNOSIS — Z8739 Personal history of other diseases of the musculoskeletal system and connective tissue: Secondary | ICD-10-CM | POA: Diagnosis not present

## 2020-03-11 DIAGNOSIS — W010XXA Fall on same level from slipping, tripping and stumbling without subsequent striking against object, initial encounter: Secondary | ICD-10-CM | POA: Diagnosis not present

## 2020-03-11 DIAGNOSIS — M25551 Pain in right hip: Secondary | ICD-10-CM | POA: Diagnosis present

## 2020-03-11 DIAGNOSIS — I10 Essential (primary) hypertension: Secondary | ICD-10-CM | POA: Diagnosis not present

## 2020-03-11 LAB — GLUCOSE, CAPILLARY
Glucose-Capillary: 147 mg/dL — ABNORMAL HIGH (ref 70–99)
Glucose-Capillary: 137 mg/dL — ABNORMAL HIGH (ref 70–99)
Glucose-Capillary: 141 mg/dL — ABNORMAL HIGH (ref 70–99)

## 2020-03-11 NOTE — Discharge Summary (Signed)
Physician Discharge Summary  Noah Adkins ZOX:[id number] DOB: [birth year] DOA: 03/02/2020  PCP: Deatra James, MD  Admit date: 03/02/2020 Discharge date: 03/11/2020  Admitted From: home Disposition:  SNF  Recommendations for Outpatient Follow-up:  1. Follow up with PCP in 1-2 weeks  Home Health: none Equipment/Devices: none  Discharge Condition: stable CODE STATUS: Full code Diet recommendation: regular  HPI: Per admitting MD, Noah Adkins is a 86 y.o. male with a history of diabetes mellitus, hypertension, complete heart block and sick sinus syndrome s/p Pacemaker who follows with Dr. Ladona Ridgel, has known CAD & RBBB, h/o TIA with "jibberish" speech that resolved, who presents to the ED via EMS status post mechanical fall with complaints of right hip pain.  Patient states that he was in the bathroom and slipped on the wet floor falling onto his right hip.  He denies head injury or loss of consciousness.  He has not been able to ambulate since the fall.  He is complaining of pain only to his right hip, he states that he did scrape his elbows as well.  He denies headache, neck pain, back pain, chest pain, abdominal pain, numbness, or weakness.  He also mentions that he had a positive at-home COVID-19 swab, had a mild cough, tested positive about a week ago.  He denies any fever, or shortness of breath to me.  He has had his COVID vaccine At home, it takes him a long time to get to the bathroom, he is somewhat sedentary and likes to sit in his chair.  Can do ADL's though, doesn't do much that exerts himself. Not sure if he can climb the stairs. There was no preceding dizziness, weakness, lightheadedness or vertigo, no chest discomfort, palpitations, or dyspnea, nor are there any of those symptoms now. The patient denies active heart issues, angina, or history of MI but has documented CAD. He does not typically exert to an equivalent of 4 METS, but believes that she could without dyspnea.  He was  previously a smoker but doesn't use inhalers, wife and him don't think OSA.   has no history of COPD or symptoms of OSA, including snoring, daytime drowsiness, apnea.  He has no history of prolonged steroid use and does not use insulin.  She does not use alcohol, and has no history of withdrawal symptoms or delirium in the context of previous surgeries.  Hospital Course / Discharge diagnoses: Principal Problem Right hip fracture -Orthopedic surgery consulted, patient was taken to the OR and is status post intramedullary fixation of the right femur on 12/6.  Continue Lovenox for DVT prophylaxis in-house then 81 mg twice daily for 6 weeks. To go to SNF  Active Problems Covid positive -No symptoms, appears to be incidental.  Received monoclonal antibodies on 12/7.  Given lack of symptoms, afebrile, needs total of 10-day isolation. He tested positive on 12/5 and can come off isolation 12/16. Remains asymptomatic today Acute kidney injury on chronic kidney disease stage IIIb -His creatinine in 2019 was 1.4-1.8, and around 2.0 in 2017. Renal function stable. Hold ACEI on d/c and monitor BP Symptomatic bradycardia status post pacemaker -Noted Mild dementia -Continue Aricept CAD, hyperlipidemia -Continue statin Type 2 diabetes mellitus, uncontrolled with hyperglycemia -Continue sliding scale, monitor CBGs HTN-BP stable off ACEI. Hold due to AKI. Monitor BP as an outpatient  Sepsis ruled out    Discharge Instructions   Allergies as of 03/11/2020      Reactions   Atenolol Nausea Only, Other (See Comments)  Dizzy, headache   Dilacor Xr [diltiazem Hcl] Other (See Comments)   NOT EFFECTIVE   Isoptin Sr [verapamil Hcl Er] Other (See Comments)   Erectile dysfunction   Lopressor [metoprolol Tartrate] Nausea Only, Other (See Comments)   dizzy, headache   Norvasc [amlodipine Besylate] Other (See Comments)   Headache and erectile dysfunction      Medication List    STOP taking these medications    aspirin 325 MG EC tablet Replaced by: aspirin 81 MG chewable tablet   lisinopril-hydrochlorothiazide 20-25 MG tablet Commonly known as: ZESTORETIC   oxyCODONE-acetaminophen 5-325 MG tablet Commonly known as: PERCOCET/ROXICET   traMADol 50 MG tablet Commonly known as: ULTRAM     TAKE these medications   allopurinol 300 MG tablet Commonly known as: ZYLOPRIM Take 300 mg by mouth daily.   aspirin 81 MG chewable tablet Commonly known as: Aspirin Childrens Chew 1 tablet (81 mg total) by mouth 2 (two) times daily with a meal. Replaces: aspirin 325 MG EC tablet   diphenhydrAMINE 25 mg capsule Commonly known as: BENADRYL Take 25 mg by mouth every 6 (six) hours as needed for itching, allergies or sleep.   donepezil 10 MG tablet Commonly known as: ARICEPT Take 10 mg by mouth daily.   DULoxetine 60 MG capsule Commonly known as: CYMBALTA Take 60 mg by mouth daily.   fluticasone 50 MCG/ACT nasal spray Commonly known as: FLONASE Place 1 spray into both nostrils daily.   glipiZIDE 2.5 MG 24 hr tablet Commonly known as: GLUCOTROL XL Take 2.5 mg by mouth daily.   guaiFENesin-dextromethorphan 100-10 MG/5ML syrup Commonly known as: ROBITUSSIN DM Take 5 mLs by mouth every 4 (four) hours as needed for cough.   ibuprofen 200 MG tablet Commonly known as: ADVIL Take 200-400 mg by mouth every 6 (six) hours as needed for fever, headache, mild pain, moderate pain or cramping.   Melatonin 3 MG Caps Take 3 mg by mouth at bedtime.   omeprazole 40 MG capsule Commonly known as: PRILOSEC Take 40 mg by mouth daily.   oxybutynin 5 MG tablet Commonly known as: DITROPAN Take 5 mg by mouth 2 (two) times daily.   oxyCODONE 5 MG immediate release tablet Commonly known as: Oxy IR/ROXICODONE Take 1.5 tablets (7.5 mg total) by mouth every 4 (four) hours as needed for moderate pain or severe pain (5 for moderate pain, 7.5 for severe pain).   simvastatin 10 MG tablet Commonly known as:  ZOCOR Take 10 mg by mouth at bedtime.   solifenacin 10 MG tablet Commonly known as: VESICARE Take 10 mg by mouth at bedtime.   tetrahydrozoline 0.05 % ophthalmic solution Place 1 drop into both eyes as needed (dry eyes).   traZODone 150 MG tablet Commonly known as: DESYREL Take 150 mg by mouth at bedtime.       Contact information for follow-up providers    Swinteck, Arlys JohnBrian, MD. Schedule an appointment as soon as possible for a visit in 2 weeks.   Specialty: Orthopedic Surgery Why: For wound re-check, For suture removal Contact information: [address] [place] [id number] [phone]            Contact information for after-discharge care    Destination    [facility] Preferred SNF .   Service: Skilled Nursing Contact information: [address] [place] [id number] [phone]                  Consultations:  Orthopedic surgery  Procedures/Studies:  R IM nail 12/6  DG Chest Port 1 View  Result Date: 03/02/2020 CLINICAL DATA:  Preop for right hip surgery EXAM: PORTABLE CHEST 1 VIEW COMPARISON:  None. FINDINGS: The heart size and mediastinal contours are within normal limits. Both lungs are clear. The visualized skeletal structures are unremarkable. Unchanged position of left chest wall pacemaker leads. IMPRESSION: No active disease. Electronically Signed   By: Deatra Robinson M.D.   On: 03/02/2020 02:45   DG Knee Right Port  Result Date: 03/02/2020 CLINICAL DATA:  Fall 1 day ago.  Proximal femoral fracture.  Pain. EXAM: PORTABLE RIGHT KNEE - 1-2 VIEW COMPARISON:  None. FINDINGS: Vascular calcifications. No distal femoral fracture. No proximal tibial or fibular fracture. No joint effusion. Tricompartmental degenerative changes. IMPRESSION: Degenerative changes in the knee. No acute abnormalities in the knee. Electronically Signed   By: Gerome Sam III M.D   On: 03/02/2020 18:22   DG C-Arm 1-60  Min-No Report  Result Date: 03/03/2020 CLINICAL DATA:  Status post right intramedullary nail placement. EXAM: RIGHT FEMUR PORTABLE 2 VIEW; DG C-ARM 1-60 MIN-NO REPORT COMPARISON:  X-ray right hip 03/03/2020, x-ray right hip 03/02/2020. FINDINGS: Intraoperative radiographs (1.3seconds) Followed by postoperative radiograph in a patient status post intramedullary nail fixation of a right intertrochanteric femoral fracture. No new acute displaced fracture. No retained radiopaque foreign body. Partially visualized right knee demonstrates moderate severe tricompartmental degenerative changes. No radiographic findings to suggest surgical consultation. The postop radiograph demonstrates edema and emphysema within the subcutaneus soft tissues of the right thigh which is consistent with postsurgical changes. Overlying right hip skin staples are noted. Vascular calcifications. IMPRESSION: Intraoperative radiographs followed by postoperative radiograph in a patient status post intramedullary nail fixation of a right intertrochanteric femoral fracture. Electronically Signed   By: Tish Frederickson M.D.   On: 03/03/2020 22:23   DG HIP OPERATIVE UNILAT W OR W/O PELVIS RIGHT  Result Date: 03/03/2020 CLINICAL DATA:  Intraoperative open reduction and internal fixation of the proximal right femur. EXAM: OPERATIVE RIGHT HIP (WITH PELVIS IF PERFORMED)  VIEWS TECHNIQUE: Fluoroscopic spot image(s) were submitted for interpretation post-operatively. COMPARISON:  None. FINDINGS: The initial fluoroscopic spot film images are marked below for the presence of a comminuted inter trochanteric fracture of the proximal right femur. Subsequent spot film images demonstrate the presence of a radiopaque intramedullary rod with subsequent compression screw device placement. There is anatomic alignment of the previously noted fracture site. IMPRESSION: Intraoperative open reduction and internal fixation of the proximal right femur. Electronically  Signed   By: Aram Candela M.D.   On: 03/03/2020 20:06   DG Hip Unilat With Pelvis 2-3 Views Right  Result Date: 03/02/2020 CLINICAL DATA:  Fall EXAM: DG HIP (WITH OR WITHOUT PELVIS) 2-3V RIGHT COMPARISON:  None. FINDINGS: There is a comminuted intertrochanteric fracture of the right femur. No dislocation. Bony pelvis is otherwise intact. IMPRESSION: Comminuted intertrochanteric fracture of the right femur. Electronically Signed   By: Deatra Robinson M.D.   On: 03/02/2020 02:46   DG FEMUR PORT, MIN 2 VIEWS RIGHT  Result Date: 03/03/2020 CLINICAL DATA:  Status post right intramedullary nail placement. EXAM: RIGHT FEMUR PORTABLE 2 VIEW; DG C-ARM 1-60 MIN-NO REPORT COMPARISON:  X-ray right hip 03/03/2020, x-ray right hip 03/02/2020. FINDINGS: Intraoperative radiographs (1.3seconds) Followed by postoperative radiograph in a patient status post intramedullary nail fixation of a right intertrochanteric femoral fracture. No new acute displaced fracture. No retained radiopaque foreign body. Partially visualized right knee demonstrates moderate severe tricompartmental degenerative changes.  No radiographic findings to suggest surgical consultation. The postop radiograph demonstrates edema and emphysema within the subcutaneus soft tissues of the right thigh which is consistent with postsurgical changes. Overlying right hip skin staples are noted. Vascular calcifications. IMPRESSION: Intraoperative radiographs followed by postoperative radiograph in a patient status post intramedullary nail fixation of a right intertrochanteric femoral fracture. Electronically Signed   By: Tish Frederickson M.D.   On: 03/03/2020 22:23      Subjective: - no chest pain, shortness of breath, no abdominal pain, nausea or vomiting.   Discharge Exam: BP 116/69 (BP Location: Right Arm)   Pulse 87   Temp 97.7 F (36.5 C)   Resp 16   Ht 5\' 6"  (1.676 m)   Wt 73.2 kg   SpO2 93%   BMI 26.05 kg/m   General: Pt is alert, awake,  not in acute distress Cardiovascular: RRR, S1/S2 +, no rubs, no gallops Respiratory: CTA bilaterally, no wheezing, no rhonchi Abdominal: Soft, NT, ND, bowel sounds + Extremities: no edema, no cyanosis    The results of significant diagnostics from this hospitalization (including imaging, microbiology, ancillary and laboratory) are listed below for reference.     Microbiology: Recent Results (from the past 240 hour(s))  Resp Panel by RT-PCR (Flu A&B, Covid) Nasopharyngeal Swab     Status: Abnormal   Collection Time: 03/02/20  2:10 AM   Specimen: Nasopharyngeal Swab; Nasopharyngeal(NP) swabs in vial transport medium  Result Value Ref Range Status   SARS Coronavirus 2 by RT PCR POSITIVE (A) NEGATIVE Final    Comment: CRITICAL RESULT CALLED TO, READ BACK BY AND VERIFIED WITH: 14/05/21 RN 03/02/20@0344  BY P.HENDERSON    Influenza A by PCR NEGATIVE NEGATIVE Final   Influenza B by PCR NEGATIVE NEGATIVE Final    Comment: (NOTE) The Xpert Xpress SARS-CoV-2/FLU/RSV plus assay is intended as an aid in the diagnosis of influenza from Nasopharyngeal swab specimens and should not be used as a sole basis for treatment. Nasal washings and aspirates are unacceptable for Xpert Xpress SARS-CoV-2/FLU/RSV testing.  Fact Sheet for Patients: 14/05/21  Fact Sheet for Healthcare Providers: BloggerCourse.com  This test is not yet approved or cleared by the [employer] FDA and has been authorized for detection and/or diagnosis of SARS-CoV-2 by FDA under an Emergency Use Authorization (EUA). This EUA will remain in effect (meaning this test can be used) for the duration of the COVID-19 declaration under Section 564(b)(1) of the Act, 21 U.S.C. section 360bbb-3(b)(1), unless the authorization is terminated or revoked.  Performed at [facility], [address], [place], [place]   Surgical pcr screen      Status: None   Collection Time: 03/03/20 11:50 AM   Specimen: Nasal Mucosa; Nasal Swab  Result Value Ref Range Status   MRSA, PCR NEGATIVE NEGATIVE Final   Staphylococcus aureus NEGATIVE NEGATIVE Final    Comment: (NOTE) The Xpert SA Assay (FDA approved for NASAL specimens in patients 67 years of age and older), is one component of a comprehensive surveillance program. It is not intended to diagnose infection nor to guide or monitor treatment. Performed at [facility], [address] [place], [place]      Labs: Basic Metabolic Panel: Recent Labs  Lab 03/06/20 0404 03/08/20 0451 03/10/20 0348  NA 135 135 133*  K 4.0 3.8 4.1  CL 100 98 95*  CO2 26 23 23   GLUCOSE 96 115* 144*  BUN 49* 36* 34*  CREATININE 2.07* 1.54*  1.51*  CALCIUM 8.1* 8.3* 8.4*   Liver Function Tests: Recent Labs  Lab 03/06/20 0404  AST 31  ALT 8  ALKPHOS 67  BILITOT 0.9  PROT 5.5*  ALBUMIN 2.5*   CBC: Recent Labs  Lab 03/05/20 0427 03/06/20 0404 03/08/20 0451 03/10/20 0348  WBC 6.4 5.6 8.0 12.2*  HGB 10.1* 9.6* 10.2* 9.9*  HCT 29.9* 28.5* 29.4* 29.6*  MCV 94.9 96.3 94.2 95.5  PLT 170 203 315 411*   CBG: Recent Labs  Lab 03/10/20 1118 03/10/20 1620 03/10/20 2122 03/11/20 0714 03/11/20 1131  GLUCAP 123* 142* 156* 147* 137*   Hgb A1c No results for input(s): HGBA1C in the last 72 hours. Lipid Profile No results for input(s): CHOL, HDL, LDLCALC, TRIG, CHOLHDL, LDLDIRECT in the last 72 hours. Thyroid function studies No results for input(s): TSH, T4TOTAL, T3FREE, THYROIDAB in the last 72 hours.  Invalid input(s): FREET3 Urinalysis    Component Value Date/Time   COLORURINE YELLOW 11/11/2017 2105   APPEARANCEUR CLEAR 11/11/2017 2105   LABSPEC 1.014 11/11/2017 2105   PHURINE 5.0 11/11/2017 2105   GLUCOSEU 50 (A) 11/11/2017 2105   HGBUR SMALL (A) 11/11/2017 2105   BILIRUBINUR NEGATIVE 11/11/2017 2105   KETONESUR NEGATIVE 11/11/2017 2105    PROTEINUR 30 (A) 11/11/2017 2105   NITRITE NEGATIVE 11/11/2017 2105   LEUKOCYTESUR NEGATIVE 11/11/2017 2105    FURTHER DISCHARGE INSTRUCTIONS:   Get Medicines reviewed and adjusted: Please take all your medications with you for your next visit with your Primary MD   Laboratory/radiological data: Please request your Primary MD to go over all hospital tests and procedure/radiological results at the follow up, please ask your Primary MD to get all Hospital records sent to his/her office.   In some cases, they will be blood work, cultures and biopsy results pending at the time of your discharge. Please request that your primary care M.D. goes through all the records of your hospital data and follows up on these results.   Also Note the following: If you experience worsening of your admission symptoms, develop shortness of breath, life threatening emergency, suicidal or homicidal thoughts you must seek medical attention immediately by calling 911 or calling your MD immediately  if symptoms less severe.   You must read complete instructions/literature along with all the possible adverse reactions/side effects for all the Medicines you take and that have been prescribed to you. Take any new Medicines after you have completely understood and accpet all the possible adverse reactions/side effects.    Do not drive when taking Pain medications or sleeping medications (Benzodaizepines)   Do not take more than prescribed Pain, Sleep and Anxiety Medications. It is not advisable to combine anxiety,sleep and pain medications without talking with your primary care practitioner   Special Instructions: If you have smoked or chewed Tobacco  in the last 2 yrs please stop smoking, stop any regular Alcohol  and or any Recreational drug use.   Wear Seat belts while driving.   Please note: You were cared for by a hospitalist during your hospital stay. Once you are discharged, your primary care physician will  handle any further medical issues. Please note that NO REFILLS for any discharge medications will be authorized once you are discharged, as it is imperative that you return to your primary care physician (or establish a relationship with a primary care physician if you do not have one) for your post hospital discharge needs so that they can reassess your need for medications and monitor  your lab values.  Time coordinating discharge: 35 minutes  SIGNED:  Pamella Pert, MD, PhD 03/11/2020, 1:58 PM

## 2020-03-11 NOTE — Plan of Care (Signed)
  Problem: Education: Goal: Knowledge of General Education information will improve Description: Including pain rating scale, medication(s)/side effects and non-pharmacologic comfort measures Outcome: Progressing   Problem: Health Behavior/Discharge Planning: Goal: Ability to manage health-related needs will improve Outcome: Progressing   Problem: Clinical Measurements: Goal: Ability to maintain clinical measurements within normal limits will improve Outcome: Progressing Goal: Will remain free from infection Outcome: Progressing Goal: Diagnostic test results will improve Outcome: Progressing Goal: Respiratory complications will improve Outcome: Progressing Goal: Cardiovascular complication will be avoided Outcome: Progressing   Problem: Activity: Goal: Risk for activity intolerance will decrease Outcome: Progressing   Problem: Nutrition: Goal: Adequate nutrition will be maintained Outcome: Progressing   Problem: Coping: Goal: Level of anxiety will decrease Outcome: Progressing   Problem: Elimination: Goal: Will not experience complications related to bowel motility Outcome: Progressing Goal: Will not experience complications related to urinary retention Outcome: Progressing   Problem: Pain Managment: Goal: General experience of comfort will improve Outcome: Progressing   Problem: Safety: Goal: Ability to remain free from injury will improve Outcome: Progressing   Problem: Skin Integrity: Goal: Risk for impaired skin integrity will decrease Outcome: Progressing   Problem: Education: Goal: Verbalization of understanding the information provided (i.e., activity precautions, restrictions, etc) will improve Outcome: Progressing Goal: Individualized Educational Video(s) Outcome: Progressing   Problem: Activity: Goal: Ability to ambulate and perform ADLs will improve Outcome: Progressing   Problem: Clinical Measurements: Goal: Postoperative complications will be  avoided or minimized Outcome: Progressing   Problem: Self-Concept: Goal: Ability to maintain and perform role responsibilities to the fullest extent possible will improve Outcome: Progressing   Problem: Pain Management: Goal: Pain level will decrease Outcome: Progressing   Problem: Education: Goal: Knowledge of risk factors and measures for prevention of condition will improve Outcome: Progressing   Problem: Respiratory: Goal: Will maintain a patent airway Outcome: Progressing Goal: Complications related to the disease process, condition or treatment will be avoided or minimized Outcome: Progressing   Problem: Activity: Goal: Ability to ambulate and perform ADLs will improve Outcome: Progressing   Problem: Clinical Measurements: Goal: Postoperative complications will be avoided or minimized Outcome: Progressing   Problem: Pain Management: Goal: Pain level will decrease Outcome: Progressing   

## 2020-03-11 NOTE — Progress Notes (Deleted)
PROGRESS NOTE  CAN LUCCI SWN:[id number] DOB: [birth year] DOA: 03/02/2020 PCP: Deatra James, MD   LOS: 9 days   Brief Narrative / Interim history: 86 year old male with history of CKD 3B, DM, HTN, complete heart block status post pacemaker, CAD, prior TIAs came into the hospital with a mechanical fall resulting in a right hip fracture.  Incidentally he was also found to be Covid positive.  Subjective / 24h Interval events: Doing well, denies any respiratory symptoms.  No shortness of breath.  No chest pain, no abdominal pain  Assessment & Plan: Principal Problem Right hip fracture -Orthopedic surgery consulted, patient was taken to the OR and is status post intramedullary fixation of the right femur on 12/6.  Continue Lovenox for DVT prophylaxis in-house then 81 mg twice daily for 6 weeks. SNF placement pending, currently Covid isolated  Active Problems Covid positive -No symptoms, appears to be incidental.  Received monoclonal antibodies on 12/7.  Given lack of symptoms, afebrile, needs total of 10-day isolation. He tested positive on 12/5 and can come off isolation 12/16 -Remains asymptomatic today  Acute kidney injury on chronic kidney disease stage IIIb -His creatinine in 2019 was 1.4-1.8, and around 2.0 in 2017.  Currently his creatinine is 2.0-2.1, I suspect this is his new baseline -Has been on fluids so far, stop, encourage p.o. intake, avoid fluid overload -Renal function stable  Symptomatic bradycardia status post pacemaker -Noted  Mild dementia -Continue Aricept  CAD, hyperlipidemia -Continue statin  Type 2 diabetes mellitus, uncontrolled with hyperglycemia -Continue sliding scale, monitor CBGs  CBG (last 3)  Recent Labs    03/10/20 1620 03/10/20 2122 03/11/20 0714  GLUCAP 142* 156* 147*    Scheduled Meds: . acetaminophen  1,000 mg Oral Q8H  . allopurinol  300 mg Oral Daily  . aspirin  325 mg Oral Daily  . cholecalciferol  1,000 Units Oral Daily  .  darifenacin  15 mg Oral Daily  . donepezil  10 mg Oral Daily  . DULoxetine  60 mg Oral Daily  . enoxaparin (LOVENOX) injection  30 mg Subcutaneous Q24H  . insulin aspart  0-9 Units Subcutaneous TID WC  . oxybutynin  5 mg Oral BID  . pantoprazole  80 mg Oral Daily  . senna-docusate  1 tablet Oral BID  . simvastatin  10 mg Oral QHS   Continuous Infusions:  PRN Meds:.bisacodyl, menthol-cetylpyridinium **OR** phenol, naphazoline-glycerin, ondansetron **OR** ondansetron (ZOFRAN) IV, oxyCODONE  Diet Orders (From admission, onward)    Start     Ordered   03/03/20 2110  Diet Carb Modified Fluid consistency: Thin; Room service appropriate? Yes  Diet effective now       Question Answer Comment  Calorie Level Medium 1600-2000   Fluid consistency: Thin   Room service appropriate? Yes      03/03/20 2109          DVT prophylaxis: enoxaparin (LOVENOX) injection 30 mg Start: 03/04/20 0800 SCDs Start: 03/03/20 2110    Code Status: Full Code  Family Communication: No family at bedside, discussed with wife over the phone   Status is: Inpatient  Remains inpatient appropriate because:Inpatient level of care appropriate due to severity of illness  Dispo:  Patient From: Home  Planned Disposition: Skilled Nursing Facility  Expected discharge date: 03/11/2020  Medically stable for discharge: Yes  Consultants:  Orthopedic surgery   Procedures:  Right IM nail 12/6  Microbiology  None   Antimicrobials: None     Objective: Vitals:   03/10/20 0350 03/10/20  1407 03/10/20 2007 03/11/20 0424  BP: (!) 141/70 139/73 (!) 142/80 137/80  Pulse: 82 99 92 88  Resp: 18 17 18 18   Temp: 98.1 F (36.7 C) 98 F (36.7 C) 97.6 F (36.4 C) 97.9 F (36.6 C)  TempSrc: Oral Oral Oral Oral  SpO2: 96% 91% 97% 92%  Weight:      Height:        Intake/Output Summary (Last 24 hours) at 03/11/2020 1046 Last data filed at 03/11/2020 0801 Gross per 24 hour  Intake 472 ml  Output 1800 ml  Net  -1328 ml   Filed Weights   03/03/20 1629  Weight: 73.2 kg    Examination:  Constitutional: NAD Respiratory: CTA Cardiovascular: RRR, no edema   Data Reviewed: I have independently reviewed following labs and imaging studies   CBC: Recent Labs  Lab 03/05/20 0427 03/06/20 0404 03/08/20 0451 03/10/20 0348  WBC 6.4 5.6 8.0 12.2*  HGB 10.1* 9.6* 10.2* 9.9*  HCT 29.9* 28.5* 29.4* 29.6*  MCV 94.9 96.3 94.2 95.5  PLT 170 203 315 411*   Basic Metabolic Panel: Recent Labs  Lab 03/06/20 0404 03/08/20 0451 03/10/20 0348  NA 135 135 133*  K 4.0 3.8 4.1  CL 100 98 95*  CO2 26 23 23   GLUCOSE 96 115* 144*  BUN 49* 36* 34*  CREATININE 2.07* 1.54* 1.51*  CALCIUM 8.1* 8.3* 8.4*   Liver Function Tests: Recent Labs  Lab 03/06/20 0404  AST 31  ALT 8  ALKPHOS 67  BILITOT 0.9  PROT 5.5*  ALBUMIN 2.5*   Coagulation Profile: No results for input(s): INR, PROTIME in the last 168 hours. HbA1C: No results for input(s): HGBA1C in the last 72 hours. CBG: Recent Labs  Lab 03/10/20 0723 03/10/20 1118 03/10/20 1620 03/10/20 2122 03/11/20 0714  GLUCAP 137* 123* 142* 156* 147*    Recent Results (from the past 240 hour(s))  Resp Panel by RT-PCR (Flu A&B, Covid) Nasopharyngeal Swab     Status: Abnormal   Collection Time: 03/02/20  2:10 AM   Specimen: Nasopharyngeal Swab; Nasopharyngeal(NP) swabs in vial transport medium  Result Value Ref Range Status   SARS Coronavirus 2 by RT PCR POSITIVE (A) NEGATIVE Final    Comment: CRITICAL RESULT CALLED TO, READ BACK BY AND VERIFIED WITH: 03/13/20 RN 03/02/20@0344  BY P.HENDERSON    Influenza A by PCR NEGATIVE NEGATIVE Final   Influenza B by PCR NEGATIVE NEGATIVE Final    Comment: (NOTE) The Xpert Xpress SARS-CoV-2/FLU/RSV plus assay is intended as an aid in the diagnosis of influenza from Nasopharyngeal swab specimens and should not be used as a sole basis for treatment. Nasal washings and aspirates are unacceptable for  Xpert Xpress SARS-CoV-2/FLU/RSV testing.  Fact Sheet for Patients: Kathlene November  Fact Sheet for Healthcare Providers: 14/05/21  This test is not yet approved or cleared by the [employer] FDA and has been authorized for detection and/or diagnosis of SARS-CoV-2 by FDA under an Emergency Use Authorization (EUA). This EUA will remain in effect (meaning this test can be used) for the duration of the COVID-19 declaration under Section 564(b)(1) of the Act, 21 U.S.C. section 360bbb-3(b)(1), unless the authorization is terminated or revoked.  Performed at [facility], [address] [place], [place]   Surgical pcr screen     Status: None   Collection Time: 03/03/20 11:50 AM   Specimen: Nasal Mucosa; Nasal Swab  Result Value Ref Range Status   MRSA, PCR NEGATIVE NEGATIVE Final  Staphylococcus aureus NEGATIVE NEGATIVE Final    Comment: (NOTE) The Xpert SA Assay (FDA approved for NASAL specimens in patients 78 years of age and older), is one component of a comprehensive surveillance program. It is not intended to diagnose infection nor to guide or monitor treatment. Performed at [facility], [address], [place], [place] [address]      Radiology Studies: No results found.  Pamella Pert, MD, PhD [facility]  Between 7 am - 7 pm I am available, please contact me via Amion or Securechat  Between 7 pm - 7 am I am not available, please contact night coverage MD/APP via Amion

## 2020-03-11 NOTE — TOC Transition Note (Signed)
Transition of Care [facility]) - CM/SW Discharge Note   Patient Details  Name: HAMZA EMPSON MRN: [id number] Date of Birth: Jul 13, 1926  Transition of Care [facility]) CM/SW Contact:  Ida Rogue, LCSW Phone Number: 03/11/2020, 2:03 PM   Clinical Narrative:  Received call from Kitty at confirming that she received insurance authorization from [employer].  Patient informed. Wife informed. MD informed.  PTAR arranged. Nursing, please call report to [phone]. TOC sign off.      Final next level of care: Skilled Nursing Facility Barriers to Discharge: Barriers Resolved   Patient Goals and CMS Choice     Choice offered to / list presented to : Spouse  Discharge Placement                       Discharge Plan and Services   Discharge Planning Services: CM Consult Post Acute Care Choice: Skilled Nursing Facility                               Social Determinants of Health (SDOH) Interventions     Readmission Risk Interventions No flowsheet data found.

## 2020-03-11 NOTE — Progress Notes (Incomplete)
Patient discharge via stretcher, PTAR came to transport patient to [facility]. Pt. Is alert and oriented, not on any distress. Vital sign stable, pt. Is on room air, alert and oriented. EVS paper attached to discharge packet. Report called to [facility], unable to get connected/ no one is answering the calls.Will re attempt to call latter. All patient belongings are with the patient, wife is notified of the discharge.

## 2020-03-11 NOTE — Progress Notes (Signed)
Pt stable and transported to [facility] by PTAR around 2300.  Belongings sent with pt, no complaints at this time.

## 2020-03-11 NOTE — Progress Notes (Signed)
RN called for report after 3 attempts. Report given to [facility]. Pt. Awaiting for transport.

## 2020-03-12 ENCOUNTER — Encounter: Payer: Self-pay | Admitting: Adult Health

## 2020-03-12 ENCOUNTER — Non-Acute Institutional Stay (SKILLED_NURSING_FACILITY): Payer: Medicare HMO | Admitting: Adult Health

## 2020-03-12 ENCOUNTER — Telehealth (HOSPITAL_BASED_OUTPATIENT_CLINIC_OR_DEPARTMENT_OTHER): Payer: Self-pay | Admitting: Emergency Medicine

## 2020-03-12 DIAGNOSIS — E118 Type 2 diabetes mellitus with unspecified complications: Secondary | ICD-10-CM | POA: Diagnosis not present

## 2020-03-12 DIAGNOSIS — Z8739 Personal history of other diseases of the musculoskeletal system and connective tissue: Secondary | ICD-10-CM

## 2020-03-12 DIAGNOSIS — F339 Major depressive disorder, recurrent, unspecified: Secondary | ICD-10-CM

## 2020-03-12 DIAGNOSIS — S72001S Fracture of unspecified part of neck of right femur, sequela: Secondary | ICD-10-CM | POA: Diagnosis not present

## 2020-03-12 DIAGNOSIS — U071 COVID-19: Secondary | ICD-10-CM | POA: Diagnosis not present

## 2020-03-12 DIAGNOSIS — F5101 Primary insomnia: Secondary | ICD-10-CM

## 2020-03-12 NOTE — Progress Notes (Signed)
Location:  [facility] Room Number: [phone] Place of Service:  SNF (31) Provider:  Kenard GowerMedina-Vargas, Monina, DNP, FNP-BC  Patient Care Team: Deatra JamesSun, Vyvyan, MD as PCP - General (Family Medicine)  Extended Emergency Contact Information Primary Emergency Contact: Katrinka BlazingSmith, "Diane" Address: [address]          [place], [place] [address] [place] Home Phone: [phone] Mobile Phone: [phone] Relation: Spouse  Code Status:  FULL CODE  Goals of care: Advanced Directive information Advanced Directives 03/02/2020  Does Patient Have a Medical Advance Directive? No  Type of Advance Directive -  Does patient want to make changes to medical advance directive? -  Copy of Healthcare Power of Attorney in Chart? -  Would patient like information on creating a medical advance directive? No - Patient declined     Chief Complaint  Patient presents with  . Acute Visit    Patient is seen for hospital followup, status post [facility] for a mechanical fall with right hip fracture.     HPI:  Pt is a 86 y.o. male who is seen today for hospital follow-up. [facility] S/P mechanical fall sustaining a right hip fracture for which he had intramedullary fixation on 03/03/2020.  He stated that he was in the bathroom and slipped on the wet floor falling onto his right hip.  He tested positive for an at-home COVID-19 swab. He is asymptomatic. No reported fever, cough nor SOB.  He has a PMH of diabetes mellitus, hypertension, SSS S/P pacemaker and complete heart block.   Past Medical History:  Diagnosis Date  . Arthritis    "knees, hands" (10/10/2015)  . History of blood transfusion 1970s   "related to work related injury; broke rib; lost blood"  . History of gout   . Hypertension   . Kidney stones   . Presence of permanent cardiac pacemaker   . Type  II diabetes mellitus (HCC)    Past Surgical History:  Procedure Laterality Date  . CATARACT EXTRACTION Right 2015  . CYSTOSCOPY W/ STONE MANIPULATION  1970s  . EP IMPLANTABLE DEVICE N/A 10/10/2015   Procedure: Pacemaker Implant;  Surgeon: Marinus MawGregg W Taylor, MD;  [facility];  Service: Cardiovascular;  Laterality: N/A;  . INSERT / REPLACE / REMOVE PACEMAKER  10/10/2015  . INTRAMEDULLARY (IM) NAIL INTERTROCHANTERIC Right 03/03/2020   Procedure: INTRAMEDULLARY (IM) NAIL INTERTROCHANTRIC;  Surgeon: Samson FredericSwinteck, Brian, MD;  Location: [facility] ORS;  Service: Orthopedics;  Laterality: Right;    Allergies  Allergen Reactions  . Atenolol Nausea Only and Other (See Comments)    Dizzy, headache  . Dilacor Xr [Diltiazem Hcl] Other (See Comments)    NOT EFFECTIVE  . Isoptin Sr [Verapamil Hcl Er] Other (See Comments)    Erectile dysfunction  . Lopressor [Metoprolol Tartrate] Nausea Only and Other (See Comments)    dizzy, headache  . Norvasc [Amlodipine Besylate] Other (See Comments)    Headache and erectile dysfunction    Outpatient Encounter Medications as of 03/12/2020  Medication Sig  . allopurinol (ZYLOPRIM) 300 MG tablet Take 300 mg by mouth daily.   Marland Kitchen. aspirin (ASPIRIN CHILDRENS) 81 MG chewable tablet Chew 1 tablet (81 mg total) by mouth 2 (two) times daily with a meal.  . bisacodyl (DULCOLAX) 10 MG suppository Place 10 mg rectally daily as needed for moderate constipation.  Marland Kitchen. donepezil (ARICEPT) 10 MG tablet Take 10  mg by mouth daily.  . DULoxetine (CYMBALTA) 60 MG capsule Take 60 mg by mouth daily.   . fluticasone (FLONASE) 50 MCG/ACT nasal spray Place 1 spray into both nostrils daily.   Marland Kitchen glipiZIDE (GLUCOTROL XL) 2.5 MG 24 hr tablet Take 2.5 mg by mouth daily.  Marland Kitchen guaiFENesin-dextromethorphan (ROBITUSSIN DM) 100-10 MG/5ML syrup Take 5 mLs by mouth every 4 (four) hours as needed for cough.  . magnesium hydroxide (MILK OF MAGNESIA) 400 MG/5ML suspension Take by mouth daily as needed for  mild constipation.  . Melatonin 3 MG CAPS Take 3 mg by mouth at bedtime.  Marland Kitchen omeprazole (PRILOSEC) 40 MG capsule Take 40 mg by mouth daily.  Marland Kitchen oxybutynin (DITROPAN) 5 MG tablet Take 5 mg by mouth 2 (two) times daily.  Marland Kitchen oxyCODONE (OXY IR/ROXICODONE) 5 MG immediate release tablet Take 5 mg by mouth every 4 (four) hours as needed for severe pain or moderate pain.  . simvastatin (ZOCOR) 10 MG tablet Take 10 mg by mouth at bedtime.   . Sodium Phosphates (RA SALINE ENEMA RE) Place 1 each rectally daily as needed (Constipation).  Marland Kitchen tetrahydrozoline 0.05 % ophthalmic solution Place 1 drop into both eyes as needed (dry eyes).  . traZODone (DESYREL) 150 MG tablet Take 150 mg by mouth at bedtime.  . [DISCONTINUED] diphenhydrAMINE (BENADRYL) 25 mg capsule Take 25 mg by mouth every 6 (six) hours as needed for itching, allergies or sleep.  . [DISCONTINUED] ibuprofen (ADVIL,MOTRIN) 200 MG tablet Take 200-400 mg by mouth every 6 (six) hours as needed for fever, headache, mild pain, moderate pain or cramping.  . [DISCONTINUED] oxyCODONE (OXY IR/ROXICODONE) 5 MG immediate release tablet Take 1.5 tablets (7.5 mg total) by mouth every 4 (four) hours as needed for moderate pain or severe pain (5 for moderate pain, 7.5 for severe pain).  . [DISCONTINUED] solifenacin (VESICARE) 10 MG tablet Take 10 mg by mouth at bedtime.   No facility-administered encounter medications on file as of 03/12/2020.    Review of Systems  GENERAL: No change in appetite, no fatigue, no weight changes, no fever, chills or weakness MOUTH and THROAT: Denies oral discomfort, gingival pain or bleeding RESPIRATORY: no cough, SOB, DOE, wheezing, hemoptysis CARDIAC: No chest pain, edema or palpitations GI: No abdominal pain, diarrhea, constipation, heart burn, nausea or vomiting GU: Denies dysuria, frequency, hematuria, incontinence, or discharge NEUROLOGICAL: Denies dizziness, syncope, numbness, or headache PSYCHIATRIC: Denies feelings of  depression or anxiety. No report of hallucinations, insomnia, paranoia, or agitation   Immunization History  Administered Date(s) Administered  . Tdap 03/02/2020   Pertinent  Health Maintenance Due  Topic Date Due  . FOOT EXAM  Never done  . OPHTHALMOLOGY EXAM  Never done  . URINE MICROALBUMIN  Never done  . PNA vac Low Risk Adult (1 of 2 - PCV13) Never done  . INFLUENZA VACCINE  10/28/2019  . HEMOGLOBIN A1C  08/31/2020    Vitals:   03/12/20 0914  BP: 123/68  Pulse: 80  Resp: 18  Temp: 97.9 F (36.6 C)  TempSrc: Oral  SpO2: 95%  Weight: 161 lb 6.1 oz (73.2 kg)  Height: 5\' 6"  (1.676 m)   Body mass index is 26.05 kg/m.  Physical Exam  GENERAL APPEARANCE: Well nourished. In no acute distress. Normal body habitus SKIN:  Right thigh/hip with 3 surgical wounds, no erythema  MOUTH and THROAT: Lips are without lesions. Oral mucosa is moist and without lesions.  RESPIRATORY: Breathing is even & unlabored, BS CTAB CARDIAC: RRR, no  murmur,no extra heart sounds, no edema. Left chest pacemaker. GI: Abdomen soft, normal BS, no masses, no tenderness NEUROLOGICAL: There is no tremor. Speech is clear. Alert and oriented X 3. PSYCHIATRIC:  Affect and behavior are appropriate  Labs reviewed: Recent Labs    03/02/20 0759 03/03/20 0400 03/04/20 0420 03/06/20 0404 03/08/20 0451 03/10/20 0348  NA  --    < > 134* 135 135 133*  K  --    < > 3.4* 4.0 3.8 4.1  CL  --    < > 97* 100 98 95*  CO2  --    < > 25 26 23 23   GLUCOSE  --    < > 266* 96 115* 144*  BUN  --    < > 55* 49* 36* 34*  CREATININE  --    < > 2.14* 2.07* 1.54* 1.51*  CALCIUM  --    < > 7.8* 8.1* 8.3* 8.4*  MG 1.6*  --  1.8  --   --   --    < > = values in this interval not displayed.   Recent Labs    03/06/20 0404  AST 31  ALT 8  ALKPHOS 67  BILITOT 0.9  PROT 5.5*  ALBUMIN 2.5*   Recent Labs    03/02/20 0210 03/03/20 0400 03/06/20 0404 03/08/20 0451 03/10/20 0348  WBC 9.3   < > 5.6 8.0 12.2*   NEUTROABS 8.2*  --   --   --   --   HGB 14.5   < > 9.6* 10.2* 9.9*  HCT 42.1   < > 28.5* 29.4* 29.6*  MCV 94.8   < > 96.3 94.2 95.5  PLT 175   < > 203 315 411*   < > = values in this interval not displayed.   Lab Results  Component Value Date   TSH 0.500 10/10/2015   Lab Results  Component Value Date   HGBA1C 6.4 (H) 03/02/2020   Lab Results  Component Value Date   CHOL 109 04/11/2017   HDL 43 04/11/2017   LDLCALC 39 04/11/2017   TRIG 136 04/11/2017   CHOLHDL 2.5 04/11/2017    Significant Diagnostic Results in last 30 days:  DG Chest Port 1 View  Result Date: 03/02/2020 CLINICAL DATA:  Preop for right hip surgery EXAM: PORTABLE CHEST 1 VIEW COMPARISON:  None. FINDINGS: The heart size and mediastinal contours are within normal limits. Both lungs are clear. The visualized skeletal structures are unremarkable. Unchanged position of left chest wall pacemaker leads. IMPRESSION: No active disease. Electronically Signed   By: 14/07/2019 M.D.   On: 03/02/2020 02:45   DG Knee Right Port  Result Date: 03/02/2020 CLINICAL DATA:  Fall 1 day ago.  Proximal femoral fracture.  Pain. EXAM: PORTABLE RIGHT KNEE - 1-2 VIEW COMPARISON:  None. FINDINGS: Vascular calcifications. No distal femoral fracture. No proximal tibial or fibular fracture. No joint effusion. Tricompartmental degenerative changes. IMPRESSION: Degenerative changes in the knee. No acute abnormalities in the knee. Electronically Signed   By: 14/07/2019 III M.D   On: 03/02/2020 18:22   DG C-Arm 1-60 Min-No Report  Result Date: 03/03/2020 CLINICAL DATA:  Status post right intramedullary nail placement. EXAM: RIGHT FEMUR PORTABLE 2 VIEW; DG C-ARM 1-60 MIN-NO REPORT COMPARISON:  X-ray right hip 03/03/2020, x-ray right hip 03/02/2020. FINDINGS: Intraoperative radiographs (1.3seconds) Followed by postoperative radiograph in a patient status post intramedullary nail fixation of a right intertrochanteric femoral fracture. No new  acute displaced  fracture. No retained radiopaque foreign body. Partially visualized right knee demonstrates moderate severe tricompartmental degenerative changes. No radiographic findings to suggest surgical consultation. The postop radiograph demonstrates edema and emphysema within the subcutaneus soft tissues of the right thigh which is consistent with postsurgical changes. Overlying right hip skin staples are noted. Vascular calcifications. IMPRESSION: Intraoperative radiographs followed by postoperative radiograph in a patient status post intramedullary nail fixation of a right intertrochanteric femoral fracture. Electronically Signed   By: Tish Frederickson M.D.   On: 03/03/2020 22:23   DG HIP OPERATIVE UNILAT W OR W/O PELVIS RIGHT  Result Date: 03/03/2020 CLINICAL DATA:  Intraoperative open reduction and internal fixation of the proximal right femur. EXAM: OPERATIVE RIGHT HIP (WITH PELVIS IF PERFORMED)  VIEWS TECHNIQUE: Fluoroscopic spot image(s) were submitted for interpretation post-operatively. COMPARISON:  None. FINDINGS: The initial fluoroscopic spot film images are marked below for the presence of a comminuted inter trochanteric fracture of the proximal right femur. Subsequent spot film images demonstrate the presence of a radiopaque intramedullary rod with subsequent compression screw device placement. There is anatomic alignment of the previously noted fracture site. IMPRESSION: Intraoperative open reduction and internal fixation of the proximal right femur. Electronically Signed   By: Aram Candela M.D.   On: 03/03/2020 20:06   DG Hip Unilat With Pelvis 2-3 Views Right  Result Date: 03/02/2020 CLINICAL DATA:  Fall EXAM: DG HIP (WITH OR WITHOUT PELVIS) 2-3V RIGHT COMPARISON:  None. FINDINGS: There is a comminuted intertrochanteric fracture of the right femur. No dislocation. Bony pelvis is otherwise intact. IMPRESSION: Comminuted intertrochanteric fracture of the right femur. Electronically  Signed   By: Deatra Robinson M.D.   On: 03/02/2020 02:46   DG FEMUR PORT, MIN 2 VIEWS RIGHT  Result Date: 03/03/2020 CLINICAL DATA:  Status post right intramedullary nail placement. EXAM: RIGHT FEMUR PORTABLE 2 VIEW; DG C-ARM 1-60 MIN-NO REPORT COMPARISON:  X-ray right hip 03/03/2020, x-ray right hip 03/02/2020. FINDINGS: Intraoperative radiographs (1.3seconds) Followed by postoperative radiograph in a patient status post intramedullary nail fixation of a right intertrochanteric femoral fracture. No new acute displaced fracture. No retained radiopaque foreign body. Partially visualized right knee demonstrates moderate severe tricompartmental degenerative changes. No radiographic findings to suggest surgical consultation. The postop radiograph demonstrates edema and emphysema within the subcutaneus soft tissues of the right thigh which is consistent with postsurgical changes. Overlying right hip skin staples are noted. Vascular calcifications. IMPRESSION: Intraoperative radiographs followed by postoperative radiograph in a patient status post intramedullary nail fixation of a right intertrochanteric femoral fracture. Electronically Signed   By: Tish Frederickson M.D.   On: 03/03/2020 22:23    Assessment/Plan  1. Closed right hip fracture, sequela -  S/P intramedullary fixation on 03/03/2020 -Continue aspirin EC 81 mg BID x6 weeks for DVT prophylaxis, oxycodone 5 mg every 4 hours PRN for pain -Follow-up with orthopedics  2. Lab test positive for detection of COVID-19 virus -Received monoclonal antibodies on 03/04/2020 -He is asymptomatic needs a total of 10 days isolation  3. History of gout -Continue allopurinol  4. Type 2 diabetes mellitus with unspecified complications (HCC) Lab Results  Component Value Date   HGBA1C 6.4 (H) 03/02/2020   - Continue glipizide and CBG checks  5. Primary insomnia -Continue melatonin and trazodone  6. Major depression, recurrent, chronic (HCC) -Mood is stable,  continue duloxetine     Family/ staff Communication: Discussed plan of care with resident and charge nurse.  Labs/tests ordered: None  Goals of care:   Short-term care  Durenda Age, DNP, MSN, FNP-BC [facility] [phone] (Monday-Friday 8:00 a.m. - 5:00 p.m.) [phone] (after hours)

## 2020-03-13 ENCOUNTER — Encounter: Payer: Self-pay | Admitting: Internal Medicine

## 2020-03-13 ENCOUNTER — Non-Acute Institutional Stay (SKILLED_NURSING_FACILITY): Payer: Medicare HMO | Admitting: Internal Medicine

## 2020-03-13 DIAGNOSIS — D649 Anemia, unspecified: Secondary | ICD-10-CM | POA: Diagnosis not present

## 2020-03-13 DIAGNOSIS — S72009S Fracture of unspecified part of neck of unspecified femur, sequela: Secondary | ICD-10-CM

## 2020-03-13 DIAGNOSIS — N1832 Chronic kidney disease, stage 3b: Secondary | ICD-10-CM

## 2020-03-13 DIAGNOSIS — E118 Type 2 diabetes mellitus with unspecified complications: Secondary | ICD-10-CM | POA: Diagnosis not present

## 2020-03-13 DIAGNOSIS — Z8739 Personal history of other diseases of the musculoskeletal system and connective tissue: Secondary | ICD-10-CM | POA: Diagnosis not present

## 2020-03-13 DIAGNOSIS — U071 COVID-19: Secondary | ICD-10-CM | POA: Insufficient documentation

## 2020-03-13 NOTE — Assessment & Plan Note (Addendum)
Creatinine peaked at 2.17 while hospitalized with a right hip fracture At discharge creatinine was 1.51, BUN 34, and GFR 44 Allopurinol dose will be decreased.

## 2020-03-13 NOTE — Assessment & Plan Note (Signed)
Because of CKD stage IIIb; allopurinol will be decreased to 100 mg/day.  Should he have any gouty symptoms; uric acid will be checked.

## 2020-03-13 NOTE — Assessment & Plan Note (Signed)
Glucoses while hospitalized ranged from a low of 96 up to 266.  The latter was an outlier as most are less than 144.  Observe for hypoglycemia as he is on a sulfonylurea.

## 2020-03-13 NOTE — Patient Instructions (Signed)
See assessment and plan under each diagnosis in the problem list and acutely for this visit 

## 2020-03-13 NOTE — Assessment & Plan Note (Signed)
Isolation to be discontinued 12/16.

## 2020-03-13 NOTE — Assessment & Plan Note (Signed)
Current H/H9.9/29.6 with normochromic, normocytic indices. No bleeding dyscrasias reported.

## 2020-03-13 NOTE — Assessment & Plan Note (Signed)
Orthopedic follow-up as scheduled PT/OT at the SNF

## 2020-03-13 NOTE — Progress Notes (Signed)
NURSING HOME LOCATION:  [facility] ROOM NUMBER:  [id number]  CODE STATUS:  FULL CODE  PCP:  Deatra James, MD  [phone] [address] [place] [address]  This is a comprehensive admission note to [facility] performed on this date less than 30 days from date of admission. Included are preadmission medical/surgical history; reconciled medication list; family history; social history and comprehensive review of systems.  Corrections and additions to the records were documented. Comprehensive physical exam was also performed. Additionally a clinical summary was entered for each active diagnosis pertinent to this admission in the Problem List to enhance continuity of care.  HPI: Patient was hospitalized 12/5-12/14/2021 admitted from home following a mechanical fall with complaints of right hip pain.  He slipped on the wet shower floor, falling on the right hip; there were no neuro or cardiac prodrome symptoms or signs.  Imaging revealed right hip fracture; on 12/6 intramedullary fixation of the right femur was completed by Dr Samson Frederic.. Postop he was to be on Lovenox for DVT prophylaxis while hospitalized and then 81 mg of aspirin twice daily for 6 weeks.  He had tested positive for Covid at home 12/5; symptoms consisted of a mild cough.  He has been vaccinated.  He received monoclonal antibodies on 12/7.  Also present was AKI on CKD stage IIIb.  ACE inhibitor was held at discharge.  He is on allopurinol 300 mg daily. He received sliding scale insulin for his diabetes.  Past medical and surgical history: Includes diabetes with renal & vascular complications, essential hypertension, dyslipidemia, complete heart block, sick sinus syndrome, history of nephrolithiasis, history of gout, history of TIA, vascular dementia, and CAD.   Surgeries and procedures include pacemaker placement & cystoscopy with stone manipulation.  Social history: Former smoker; nondrinker.  Family  history: Noncontributory due to advanced age.   Review of systems: He is profoundly hard of hearing which hindered taking the review of systems history.  He describes being sleepy all day and his mouth being dry.  He also describes intermittent numbness of the arms which gets better when he moves them.  He does validate pain in the hip and also his back.  He previously had diarrhea but this has apparently resolved.  He denies any active gout symptoms.  He states that he was checking his sugars at home and fasting blood sugars were approximately 130.  He denies any hypoglycemia.  He denies any constitutional or symptoms of Covid at this time. Staff reports that he is eating about 50% of his meals.  Constitutional: No fever, significant weight change  Eyes: No redness, discharge, pain, vision change ENT/mouth: No nasal congestion, purulent discharge, earache, change in hearing, sore throat  Cardiovascular: No chest pain, palpitations, paroxysmal nocturnal dyspnea, claudication, edema  Respiratory: No cough, sputum production, hemoptysis, DOE, significant snoring, apnea  Gastrointestinal: No heartburn, dysphagia, abdominal pain, nausea /vomiting, rectal bleeding, melena Genitourinary: No dysuria, hematuria, pyuria, incontinence, nocturia Dermatologic: No rash, pruritus, change in appearance of skin Neurologic: No dizziness, headache, syncope, seizures Psychiatric: No significant anxiety, depression,anorexia Endocrine: No change in hair/skin/nails, excessive thirst, excessive hunger, excessive urination  Hematologic/lymphatic: No significant bruising, lymphadenopathy, abnormal bleeding Allergy/immunology: No itchy/watery eyes, significant sneezing, urticaria, angioedema  Physical exam:  Pertinent or positive findings: He appears his age and somewhat chronically ill.  As noted he is profoundly hard of hearing.  He has pattern alopecia.  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  There are deep creases above  the eyebrows.  Initially was asleep and exhibited involuntary tremor of the mandible which mimicked almost subaudible speech pattern.  Ptosis is present on the left.  He is edentulous; tongue is clinically moist.  Slight gallop cadence is present.  Breath sounds are decreased.  Foley catheter is present and the urine is pinkish.  Pedal pulses are decreased.  He has trace edema at the ankles.  He was weak to opposition the upper extremities.  The lower extremity were not tested for strength because of the recent surgery.  He does have isolated DIP arthritic changes with some flexion.  General appearance: no acute distress, increased work of breathing is present.   Lymphatic: No lymphadenopathy about the head, neck, axilla. Eyes: No conjunctival inflammation or lid edema is present. There is no scleral icterus. Ears:  External ear exam shows no significant lesions or deformities.   Nose:  External nasal examination shows no deformity or inflammation. Nasal mucosa are pink and moist without lesions, exudates Oral exam: Lips and gums are healthy appearing.There is no oropharyngeal erythema or exudate. Neck:  No thyromegaly, masses, tenderness noted.    Heart:  No murmur, click, rub.  Lungs: without wheezes, rhonchi, rales, rubs. Abdomen: Bowel sounds are normal.  Abdomen is soft and nontender with no organomegaly, hernias, masses. GU: Deferred  Extremities:  No cyanosis, clubbing. Neurologic exam: Balance, Rhomberg, finger to nose testing could not be completed due to clinical state Skin: Warm & dry w/o tenting. No significant lesions or rash.  See clinical summary under each active problem in the Problem List with associated updated therapeutic plan

## 2020-03-14 LAB — HEMOGLOBIN A1C: Hemoglobin A1C: 6.4

## 2020-03-24 ENCOUNTER — Telehealth: Payer: Self-pay

## 2020-03-24 NOTE — Telephone Encounter (Signed)
Alert received from [employer] that pt data has not transmitted for >21 days.  It appears pt was hospitalized and now possibly in Rehab or SNF.  Attempted to reach # on record, no answer, left VM requesting callback to device clinic.

## 2020-03-27 NOTE — Telephone Encounter (Signed)
Spoke to [place]  at [phone]). She will have the wife bring the patient's monitor to the facility.

## 2020-04-01 ENCOUNTER — Other Ambulatory Visit: Payer: Self-pay | Admitting: Adult Health

## 2020-04-01 MED ORDER — OXYCODONE HCL 5 MG PO TABS: 5.0000 mg | ORAL_TABLET | ORAL | 0 refills | Status: DC | PRN

## 2020-04-02 DIAGNOSIS — S72141A Displaced intertrochanteric fracture of right femur, initial encounter for closed fracture: Secondary | ICD-10-CM | POA: Insufficient documentation

## 2020-04-03 ENCOUNTER — Encounter: Payer: Self-pay | Admitting: Adult Health

## 2020-04-03 ENCOUNTER — Non-Acute Institutional Stay (SKILLED_NURSING_FACILITY): Payer: Medicare HMO | Admitting: Adult Health

## 2020-04-03 DIAGNOSIS — E118 Type 2 diabetes mellitus with unspecified complications: Secondary | ICD-10-CM | POA: Diagnosis not present

## 2020-04-03 DIAGNOSIS — F339 Major depressive disorder, recurrent, unspecified: Secondary | ICD-10-CM

## 2020-04-03 DIAGNOSIS — D649 Anemia, unspecified: Secondary | ICD-10-CM

## 2020-04-03 DIAGNOSIS — S72009S Fracture of unspecified part of neck of unspecified femur, sequela: Secondary | ICD-10-CM

## 2020-04-03 NOTE — Progress Notes (Signed)
Location:  [facility] Room Number: 211/A Place of Service:  SNF (31) Provider:  Kenard Gower, DNP, FNP-BC  Patient Care Team: Deatra James, MD as PCP - General (Family Medicine)  Extended Emergency Contact Information Primary Emergency Contact: Katrinka BlazingDiane" Address: [address]          [place], [place] [address] [name] of [place] Home Phone: [phone] Mobile Phone: [phone] Relation: Spouse  Code Status:  Full Code  Goals of care: Advanced Directive information Advanced Directives 04/03/2020  Does Patient Have a Medical Advance Directive? No  Type of Advance Directive -  Does patient want to make changes to medical advance directive? -  Copy of Healthcare Power of Attorney in Chart? -  Would patient like information on creating a medical advance directive? No - Patient declined     Chief Complaint  Patient presents with  . Acute Visit    Rehab Visit    HPI:   Pt is a 87 y.o. male seen today for a short-term rehabilitation visit. He has a PMH of diabetes mellitus, hypertension, SSS S/P pacemaker and complete heart block.  He was admitted to [facility] on 03/11/2020 post [facility] hospitalization 03/02/2020 to 03/11/2020 S/P mechanical fall sustaining a right hip fracture for which she had intramedullary fixation on 03/03/2020.  He tested positive for an at-home COVID-19 swab.  He is asymptomatic.  No reported fever, cough nor SOB.  He was seen in the room today. Right hip wound is dry and no erythema.  He verbalized that his pain is well controlled.  He is currently taking oxycodone 5 mg every 4 hours PRN.  CBGs ranging from 102 to 166.  He takes glipizide ER 2.5 mg 1 tab daily for diabetes mellitus.    Past Medical History:  Diagnosis Date  . Arthritis    "knees, hands" (10/10/2015)  . History of blood transfusion 1970s   "related to work related injury; broke rib; lost blood"  . History of gout    . Hypertension   . Kidney stones   . Presence of permanent cardiac pacemaker   . Type II diabetes mellitus (HCC)    Past Surgical History:  Procedure Laterality Date  . CATARACT EXTRACTION Right 2015  . CYSTOSCOPY W/ STONE MANIPULATION  1970s  . EP IMPLANTABLE DEVICE N/A 10/10/2015   Procedure: Pacemaker Implant;  Surgeon: Marinus Maw, MD;  Location: [facility] INVASIVE CV LAB;  Service: Cardiovascular;  Laterality: N/A;  . INSERT / REPLACE / REMOVE PACEMAKER  10/10/2015  . INTRAMEDULLARY (IM) NAIL INTERTROCHANTERIC Right 03/03/2020   Procedure: INTRAMEDULLARY (IM) NAIL INTERTROCHANTRIC;  Surgeon: Samson Frederic, MD;  Location: [facility] ORS;  Service: Orthopedics;  Laterality: Right;    Allergies  Allergen Reactions  . Atenolol Nausea Only and Other (See Comments)    Dizzy, headache  . Dilacor Xr [Diltiazem Hcl] Other (See Comments)    NOT EFFECTIVE  . Isoptin Sr [Verapamil Hcl Er] Other (See Comments)    Erectile dysfunction  . Lopressor [Metoprolol Tartrate] Nausea Only and Other (See Comments)    dizzy, headache  . Norvasc [Amlodipine Besylate] Other (See Comments)    Headache and erectile dysfunction    Outpatient Encounter Medications as of 04/03/2020  Medication Sig  . allopurinol (ZYLOPRIM) 100 MG tablet Take 100 mg by mouth daily.  Marland Kitchen aspirin (ASPIRIN CHILDRENS) 81 MG chewable tablet Chew 1 tablet (81 mg total) by mouth 2 (two) times daily with a meal.  .  bisacodyl (DULCOLAX) 10 MG suppository Place 10 mg rectally daily as needed for moderate constipation.  Marland Kitchen donepezil (ARICEPT) 10 MG tablet Take 10 mg by mouth daily.  . DULoxetine (CYMBALTA) 60 MG capsule Take 60 mg by mouth daily.   . fluticasone (FLONASE) 50 MCG/ACT nasal spray Place 1 spray into both nostrils daily.   Marland Kitchen glipiZIDE (GLUCOTROL XL) 2.5 MG 24 hr tablet Take 2.5 mg by mouth daily.  Marland Kitchen guaiFENesin-dextromethorphan (ROBITUSSIN DM) 100-10 MG/5ML syrup Take 5 mLs by mouth every 4 (four) hours as needed for cough.  .  magnesium hydroxide (MILK OF MAGNESIA) 400 MG/5ML suspension Take by mouth daily as needed for mild constipation.  . Melatonin 3 MG CAPS Take 3 mg by mouth at bedtime.  . NON FORMULARY 120ML MED PASS SF DAILY  . omeprazole (PRILOSEC) 40 MG capsule Take 40 mg by mouth daily.  Marland Kitchen oxybutynin (DITROPAN) 5 MG tablet Take 5 mg by mouth 2 (two) times daily.  Marland Kitchen oxyCODONE (OXY IR/ROXICODONE) 5 MG immediate release tablet Take 1 tablet (5 mg total) by mouth every 4 (four) hours as needed for severe pain or moderate pain.  . OXYGEN Inhale 2 L into the lungs continuous.  . simvastatin (ZOCOR) 10 MG tablet Take 10 mg by mouth at bedtime.   . Sodium Phosphates (RA SALINE ENEMA RE) Place 1 each rectally daily as needed (Constipation).  Marland Kitchen tetrahydrozoline 0.05 % ophthalmic solution Place 1 drop into both eyes as needed (dry eyes).  . traZODone (DESYREL) 150 MG tablet Take 150 mg by mouth at bedtime.  . [DISCONTINUED] allopurinol (ZYLOPRIM) 300 MG tablet Take 300 mg by mouth daily.    No facility-administered encounter medications on file as of 04/03/2020.    Review of Systems  GENERAL: No change in appetite, no fatigue, no weight changes, no fever, chills or weakness MOUTH and THROAT: Denies oral discomfort, gingival pain or bleeding RESPIRATORY: no cough, SOB, DOE, wheezing, hemoptysis CARDIAC: No chest pain, edema or palpitations GI: No abdominal pain, diarrhea, constipation, heart burn, nausea or vomiting GU: Denies dysuria, frequency, hematuria, incontinence, or discharge NEUROLOGICAL: Denies dizziness, syncope, numbness, or headache PSYCHIATRIC: Denies feelings of depression or anxiety. No report of hallucinations, insomnia, paranoia, or agitation    Immunization History  Administered Date(s) Administered  . Tdap 03/02/2020   Pertinent  Health Maintenance Due  Topic Date Due  . FOOT EXAM  Never done  . OPHTHALMOLOGY EXAM  Never done  . URINE MICROALBUMIN  Never done  . PNA vac Low Risk Adult  (1 of 2 - PCV13) Never done  . INFLUENZA VACCINE  10/28/2019  . HEMOGLOBIN A1C  08/31/2020   No flowsheet data found.   Vitals:   04/03/20 1535  BP: 105/67  Pulse: 93  Resp: 17  Temp: (!) 96.4 F (35.8 C)  SpO2: 95%  Weight: 150 lb (68 kg)  Height: 5\' 5"  (1.651 m)   Body mass index is 24.96 kg/m.  Physical Exam  GENERAL APPEARANCE: Well nourished. In no acute distress. Normal body habitus SKIN:  Right hip surgical incision is dry, no erythema MOUTH and THROAT: Lips are without lesions. Oral mucosa is moist and without lesions. Tongue is normal in shape, size, and color and without lesions RESPIRATORY: Breathing is even & unlabored, BS CTAB CARDIAC: RRR, no murmur,no extra heart sounds, no edema GI: Abdomen soft, normal BS, no masses, no tenderness EXTREMITIES:  Able to move X 4 extremities NEUROLOGICAL: There is no tremor. Speech is clear. Alert and oriented  X 3. PSYCHIATRIC:  Affect and behavior are appropriate  Labs reviewed: Recent Labs    03/02/20 0759 03/03/20 0400 03/04/20 0420 03/06/20 0404 03/08/20 0451 03/10/20 0348  NA  --    < > 134* 135 135 133*  K  --    < > 3.4* 4.0 3.8 4.1  CL  --    < > 97* 100 98 95*  CO2  --    < > 25 26 23 23   GLUCOSE  --    < > 266* 96 115* 144*  BUN  --    < > 55* 49* 36* 34*  CREATININE  --    < > 2.14* 2.07* 1.54* 1.51*  CALCIUM  --    < > 7.8* 8.1* 8.3* 8.4*  MG 1.6*  --  1.8  --   --   --    < > = values in this interval not displayed.   Recent Labs    03/06/20 0404  AST 31  ALT 8  ALKPHOS 67  BILITOT 0.9  PROT 5.5*  ALBUMIN 2.5*   Recent Labs    03/02/20 0210 03/03/20 0400 03/06/20 0404 03/08/20 0451 03/10/20 0348  WBC 9.3   < > 5.6 8.0 12.2*  NEUTROABS 8.2*  --   --   --   --   HGB 14.5   < > 9.6* 10.2* 9.9*  HCT 42.1   < > 28.5* 29.4* 29.6*  MCV 94.8   < > 96.3 94.2 95.5  PLT 175   < > 203 315 411*   < > = values in this interval not displayed.   Lab Results  Component Value Date   TSH 0.500  10/10/2015   Lab Results  Component Value Date   HGBA1C 6.4 (H) 03/02/2020   Lab Results  Component Value Date   CHOL 109 04/11/2017   HDL 43 04/11/2017   LDLCALC 39 04/11/2017   TRIG 136 04/11/2017   CHOLHDL 2.5 04/11/2017     Assessment/Plan  1. Type 2 diabetes mellitus with unspecified complications (HCC) Lab Results  Component Value Date   HGBA1C 6.4 (H) 03/02/2020   -Stable, continue glipizide ER and CBG checks  2. Closed hip fracture requiring operative repair, sequela - S/P intramedullary fixation on 03/03/2020 -Continue aspirin 381 mg twice a day with stop date 04/15/2020 for DVT prophylaxis -Will decrease oxycodone 5 mg every 4 hours PRN to every 8 hours PRN for pain -Follow-up with orthopedics  3. Normochromic normocytic anemia Lab Results  Component Value Date   HGB 9.9 (L) 03/10/2020   -  stable  4. Major depression, recurrent, chronic (HCC) -Mood is stable, continue duloxetine     Family/ staff Communication: Discussed plan of care with resident and charge nurse.  Labs/tests ordered: None  Goals of care:   Short-term care    03/12/2020, DNP, MSN, FNP-BC [facility] [phone] (Monday-Friday 8:00 a.m. - 5:00 p.m.) [phone] (after hours)

## 2020-04-04 ENCOUNTER — Non-Acute Institutional Stay (SKILLED_NURSING_FACILITY): Payer: Medicare HMO | Admitting: Adult Health

## 2020-04-04 ENCOUNTER — Encounter: Payer: Self-pay | Admitting: Adult Health

## 2020-04-04 DIAGNOSIS — R1312 Dysphagia, oropharyngeal phase: Secondary | ICD-10-CM | POA: Diagnosis not present

## 2020-04-04 DIAGNOSIS — S72001S Fracture of unspecified part of neck of right femur, sequela: Secondary | ICD-10-CM | POA: Diagnosis not present

## 2020-04-04 DIAGNOSIS — F339 Major depressive disorder, recurrent, unspecified: Secondary | ICD-10-CM | POA: Diagnosis not present

## 2020-04-04 DIAGNOSIS — Z8739 Personal history of other diseases of the musculoskeletal system and connective tissue: Secondary | ICD-10-CM | POA: Diagnosis not present

## 2020-04-04 DIAGNOSIS — N1832 Chronic kidney disease, stage 3b: Secondary | ICD-10-CM

## 2020-04-04 DIAGNOSIS — F0390 Unspecified dementia without behavioral disturbance: Secondary | ICD-10-CM

## 2020-04-04 DIAGNOSIS — U071 COVID-19: Secondary | ICD-10-CM | POA: Diagnosis not present

## 2020-04-04 DIAGNOSIS — F5101 Primary insomnia: Secondary | ICD-10-CM | POA: Diagnosis not present

## 2020-04-04 DIAGNOSIS — E1122 Type 2 diabetes mellitus with diabetic chronic kidney disease: Secondary | ICD-10-CM

## 2020-04-04 MED ORDER — OXYCODONE HCL 5 MG PO TABS: 5.0000 mg | ORAL_TABLET | Freq: Three times a day (TID) | ORAL | 0 refills | Status: DC | PRN

## 2020-04-04 MED ORDER — MELATONIN 3 MG PO CAPS
3.0000 mg | ORAL_CAPSULE | Freq: Every day | ORAL | 0 refills | Status: DC
Start: 2020-04-04 — End: 2021-08-03

## 2020-04-04 MED ORDER — GLIPIZIDE ER 2.5 MG PO TB24
2.5000 mg | ORAL_TABLET | Freq: Every day | ORAL | 0 refills | Status: DC
Start: 2020-04-04 — End: 2021-08-03

## 2020-04-04 MED ORDER — SIMVASTATIN 10 MG PO TABS: 10.0000 mg | ORAL_TABLET | Freq: Every day | ORAL | 0 refills | Status: AC

## 2020-04-04 MED ORDER — FLUTICASONE PROPIONATE 50 MCG/ACT NA SUSP: 1.0000 | Freq: Every day | NASAL | 0 refills | Status: AC

## 2020-04-04 MED ORDER — ALLOPURINOL 100 MG PO TABS
100.0000 mg | ORAL_TABLET | Freq: Every day | ORAL | 0 refills | Status: DC
Start: 2020-04-04 — End: 2021-08-03

## 2020-04-04 MED ORDER — DULOXETINE HCL 60 MG PO CPEP: 60.0000 mg | ORAL_CAPSULE | Freq: Every day | ORAL | 0 refills | Status: AC

## 2020-04-04 MED ORDER — OMEPRAZOLE 40 MG PO CPDR
40.0000 mg | DELAYED_RELEASE_CAPSULE | Freq: Every day | ORAL | 0 refills | Status: AC
Start: 2020-04-04 — End: ?

## 2020-04-04 MED ORDER — DONEPEZIL HCL 10 MG PO TABS
10.0000 mg | ORAL_TABLET | Freq: Every day | ORAL | 0 refills | Status: AC
Start: 2020-04-04 — End: ?

## 2020-04-04 MED ORDER — OXYBUTYNIN CHLORIDE 5 MG PO TABS: 5.0000 mg | ORAL_TABLET | Freq: Two times a day (BID) | ORAL | 0 refills | Status: AC

## 2020-04-04 MED ORDER — TRAZODONE HCL 150 MG PO TABS
150.0000 mg | ORAL_TABLET | Freq: Every day | ORAL | 0 refills | Status: AC
Start: 2020-04-04 — End: ?

## 2020-04-04 NOTE — Progress Notes (Addendum)
Location:  [facility] Room Number: 211/A Place of Service:  SNF (31) Provider:  Kenard Gower, DNP, FNP-BC  Patient Care Team: Deatra James, MD as PCP - General (Family Medicine)  Extended Emergency Contact Information Primary Emergency Contact: Katrinka BlazingDiane" Address: [address]          [place], [place] [address] Noah Adkins of [place] Home Phone: [phone] Mobile Phone: [phone] Relation: Spouse  Code Status:   Full Code  Goals of care: Advanced Directive information Advanced Directives 04/04/2020  Does Patient Have a Medical Advance Directive? No  Type of Advance Directive -  Does patient want to make changes to medical advance directive? -  Copy of Healthcare Power of Attorney in Chart? -  Would patient like information on creating a medical advance directive? No - Patient declined     Chief Complaint  Patient presents with  . Discharge Note    Discharge Visit     HPI:  Pt is a 87 y.o. male who is for discharge home with Home health PT and OT .  He has been admitted to [facility] on 03/11/20 post [facility] hospitalization 03/02/20 to 03/11/20 S/P mechanical fall sustaining a right hip fracture for which she had intramedullary fixation on 03/03/2020.  He tested positive for an at-home COVID-19 swab.  He is asymptomatic.  No reported fever, cough nor SOB.  Patient was admitted to this facility for short-term rehabilitation after the patient's recent hospitalization.  Patient has completed SNF rehabilitation and therapy has cleared the patient for discharge.   Past Medical History:  Diagnosis Date  . Arthritis    "knees, hands" (10/10/2015)  . History of blood transfusion 1970s   "related to work related injury; broke rib; lost blood"  . History of gout   . Hypertension   . Kidney stones   . Presence of permanent cardiac pacemaker   . Type II diabetes mellitus (HCC)    Past Surgical History:   Procedure Laterality Date  . CATARACT EXTRACTION Right 2015  . CYSTOSCOPY W/ STONE MANIPULATION  1970s  . EP IMPLANTABLE DEVICE N/A 10/10/2015   Procedure: Pacemaker Implant;  Surgeon: Marinus Maw, MD;  Location: [facility] INVASIVE CV LAB;  Service: Cardiovascular;  Laterality: N/A;  . INSERT / REPLACE / REMOVE PACEMAKER  10/10/2015  . INTRAMEDULLARY (IM) NAIL INTERTROCHANTERIC Right 03/03/2020   Procedure: INTRAMEDULLARY (IM) NAIL INTERTROCHANTRIC;  Surgeon: Samson Frederic, MD;  Location: [facility] ORS;  Service: Orthopedics;  Laterality: Right;    Allergies  Allergen Reactions  . Atenolol Nausea Only and Other (See Comments)    Dizzy, headache  . Dilacor Xr [Diltiazem Hcl] Other (See Comments)    NOT EFFECTIVE  . Isoptin Sr [Verapamil Hcl Er] Other (See Comments)    Erectile dysfunction  . Lopressor [Metoprolol Tartrate] Nausea Only and Other (See Comments)    dizzy, headache  . Norvasc [Amlodipine Besylate] Other (See Comments)    Headache and erectile dysfunction    Outpatient Encounter Medications as of 04/04/2020  Medication Sig  . allopurinol (ZYLOPRIM) 100 MG tablet Take 100 mg by mouth daily.  Marland Kitchen aspirin (ASPIRIN CHILDRENS) 81 MG chewable tablet Chew 1 tablet (81 mg total) by mouth 2 (two) times daily with a meal.  . bisacodyl (DULCOLAX) 10 MG suppository Place 10 mg rectally daily as needed for moderate constipation.  Marland Kitchen donepezil (ARICEPT) 10 MG tablet Take 10 mg by mouth daily.  . DULoxetine (CYMBALTA) 60 MG capsule Take 60  mg by mouth daily.   . fluticasone (FLONASE) 50 MCG/ACT nasal spray Place 1 spray into both nostrils daily.   Marland Kitchen glipiZIDE (GLUCOTROL XL) 2.5 MG 24 hr tablet Take 2.5 mg by mouth daily.  Marland Kitchen guaiFENesin-dextromethorphan (ROBITUSSIN DM) 100-10 MG/5ML syrup Take 5 mLs by mouth every 4 (four) hours as needed for cough.  . magnesium hydroxide (MILK OF MAGNESIA) 400 MG/5ML suspension Take by mouth daily as needed for mild constipation.  . Melatonin 3 MG CAPS Take 3 mg by  mouth at bedtime.  . NON FORMULARY MED PASS SF DAILY  . omeprazole (PRILOSEC) 40 MG capsule Take 40 mg by mouth daily.  Marland Kitchen oxybutynin (DITROPAN) 5 MG tablet Take 5 mg by mouth 2 (two) times daily.  Marland Kitchen oxyCODONE (OXY IR/ROXICODONE) 5 MG immediate release tablet Take 1 tablet (5 mg total) by mouth every 4 (four) hours as needed for severe pain or moderate pain.  . OXYGEN Inhale 2 L into the lungs continuous.  . simvastatin (ZOCOR) 10 MG tablet Take 10 mg by mouth at bedtime.   . Sodium Phosphates (RA SALINE ENEMA RE) Place 1 each rectally daily as needed (Constipation).  Marland Kitchen tetrahydrozoline 0.05 % ophthalmic solution Place 1 drop into both eyes as needed (dry eyes).  . traZODone (DESYREL) 150 MG tablet Take 150 mg by mouth at bedtime.   No facility-administered encounter medications on file as of 04/04/2020.    Review of Systems  GENERAL: No change in appetite, no fatigue, no weight changes, no fever, chills or weakness MOUTH and THROAT: Denies oral discomfort, gingival pain or bleeding RESPIRATORY: no cough, SOB, DOE, wheezing, hemoptysis CARDIAC: No chest pain, edema or palpitations GI: No abdominal pain, diarrhea, constipation, heart burn, nausea or vomiting GU: Denies dysuria, frequency, hematuria, incontinence, or discharge NEUROLOGICAL: Denies dizziness, syncope, numbness, or headache PSYCHIATRIC: Denies feelings of depression or anxiety. No report of hallucinations, insomnia, paranoia, or agitation    Immunization History  Administered Date(s) Administered  . Tdap 03/02/2020   Pertinent  Health Maintenance Due  Topic Date Due  . FOOT EXAM  Never done  . OPHTHALMOLOGY EXAM  Never done  . URINE MICROALBUMIN  Never done  . PNA vac Low Risk Adult (1 of 2 - PCV13) Never done  . INFLUENZA VACCINE  10/28/2019  . HEMOGLOBIN A1C  08/31/2020   No flowsheet data found.   Vitals:   04/04/20 1332  BP: 105/67  Pulse: 93  Resp: 17  Temp: (!) 96.4 F (35.8 C)  SpO2: 95%   Weight: 150 lb (68 kg)  Height: 5\' 5"  (1.651 m)   Body mass index is 24.96 kg/m.  Physical Exam  GENERAL APPEARANCE: Well nourished. In no acute distress. Normal body habitus SKIN:  Right hip surgical wound is dry, no erythema MOUTH and THROAT: Lips are without lesions. Oral mucosa is moist and without lesions. Tongue is normal in shape, size, and color and without lesions RESPIRATORY: Breathing is even & unlabored, BS CTAB CARDIAC: RRR, no murmur,no extra heart sounds, no edema GI: Abdomen soft, normal BS, no masses, no tenderness EXTREMITIES:  Able to move X 4 extremities NEUROLOGICAL: There is no tremor. Speech is clear. Alert and oriented X 3. PSYCHIATRIC:  Affect and behavior are appropriate  Labs reviewed: Recent Labs    03/02/20 0759 03/03/20 0400 03/04/20 0420 03/06/20 0404 03/08/20 0451 03/10/20 0348  NA  --    < > 134* 135 135 133*  K  --    < > 3.4*  4.0 3.8 4.1  CL  --    < > 97* 100 98 95*  CO2  --    < > 25 26 23 23   GLUCOSE  --    < > 266* 96 115* 144*  BUN  --    < > 55* 49* 36* 34*  CREATININE  --    < > 2.14* 2.07* 1.54* 1.51*  CALCIUM  --    < > 7.8* 8.1* 8.3* 8.4*  MG 1.6*  --  1.8  --   --   --    < > = values in this interval not displayed.   Recent Labs    03/06/20 0404  AST 31  ALT 8  ALKPHOS 67  BILITOT 0.9  PROT 5.5*  ALBUMIN 2.5*   Recent Labs    03/02/20 0210 03/03/20 0400 03/06/20 0404 03/08/20 0451 03/10/20 0348  WBC 9.3   < > 5.6 8.0 12.2*  NEUTROABS 8.2*  --   --   --   --   HGB 14.5   < > 9.6* 10.2* 9.9*  HCT 42.1   < > 28.5* 29.4* 29.6*  MCV 94.8   < > 96.3 94.2 95.5  PLT 175   < > 203 315 411*   < > = values in this interval not displayed.   Lab Results  Component Value Date   TSH 0.500 10/10/2015   Lab Results  Component Value Date   HGBA1C 6.4 (H) 03/02/2020   Lab Results  Component Value Date   CHOL 109 04/11/2017   HDL 43 04/11/2017   LDLCALC 39 04/11/2017   TRIG 136 04/11/2017   CHOLHDL 2.5  04/11/2017     Assessment/Plan  1. Closed right hip fracture, sequela -  S/P intramedullary fixation on 03/03/2020 -Continue aspirin EC 81 mg twice daily for 6 weeks, end date 04/15/2020 for DVT prophylaxis - oxyCODONE (OXY IR/ROXICODONE) 5 MG immediate release tablet; Take 1 tablet (5 mg total) by mouth every 8 (eight) hours as needed for severe pain.  Dispense: 21 tablet; Refill: 0 -Follow-up with orthopedics -For home health PT and OT, for therapeutic strengthening exercises  2. Lab test positive for detection of COVID-19 virus - received monoclonal antibodies on 03/04/2020 -He is asymptomatic and completed 10 days of isolation  3. Type 2 diabetes mellitus with stage 3b chronic kidney disease, without long-term current use of insulin (HCC) Lab Results  Component Value Date   HGBA1C 6.4 (H) 03/02/2020   - glipiZIDE (GLUCOTROL XL) 2.5 MG 24 hr tablet; Take 1 tablet (2.5 mg total) by mouth daily.  Dispense: 30 tablet; Refill: 0  4. Major depression, recurrent, chronic (HCC) - DULoxetine (CYMBALTA) 60 MG capsule; Take 1 capsule (60 mg total) by mouth daily.  Dispense: 30 capsule; Refill: 0  5. History of gout - allopurinol (ZYLOPRIM) 100 MG tablet; Take 1 tablet (100 mg total) by mouth daily.  Dispense: 30 tablet; Refill: 0  6. Primary insomnia - Melatonin 3 MG CAPS; Take 1 capsule (3 mg total) by mouth at bedtime.  Dispense: 30 capsule; Refill: 0 - traZODone (DESYREL) 150 MG tablet; Take 1 tablet (150 mg total) by mouth at bedtime.  Dispense: 30 tablet; Refill: 0  7. Oropharyngeal dysphagia - will need a semi-electric hospital bed to prevent aspiration  8. Dementia without behavioral disturbance, unspecified dementia type (HCC) - donepezil (ARICEPT) 10 MG tablet; Take 1 tablet (10 mg total) by mouth daily.  Dispense: 30 tablet; Refill: 0  I have filled out patient's discharge paperwork and e-prescribed medications.  Patient will have home health PT and OT.  DME provided:  3-in-1, semi-electric hospital bed and rolling walker  Patient has dysphagia which requires his chest and head to be positioned in ways not feasible a normal bed.  Head must be elevated at least 30 degrees or aspiration will occur.  Dysphagia requires frequent and immediate changes in body position which cannot be achieved with a normal bed.  Total discharge time: Greater than 30 minutes Greater than 50% was spent in counseling and coordination of care.   Discharge time involved coordination of the discharge process with social worker, nursing staff and therapy department. Medical justification for home health services/DME verified.   Durenda Age, DNP, MSN, FNP-BC [facility] [phone] (Monday-Friday 8:00 a.m. - 5:00 p.m.) [phone] (after hours)

## 2020-04-07 DIAGNOSIS — S72141D Displaced intertrochanteric fracture of right femur, subsequent encounter for closed fracture with routine healing: Secondary | ICD-10-CM | POA: Diagnosis not present

## 2020-04-08 NOTE — Telephone Encounter (Signed)
Monitor transmitting.

## 2020-04-09 ENCOUNTER — Ambulatory Visit (INDEPENDENT_AMBULATORY_CARE_PROVIDER_SITE_OTHER): Payer: Medicare HMO

## 2020-04-09 DIAGNOSIS — I129 Hypertensive chronic kidney disease with stage 1 through stage 4 chronic kidney disease, or unspecified chronic kidney disease: Secondary | ICD-10-CM | POA: Diagnosis not present

## 2020-04-09 DIAGNOSIS — R55 Syncope and collapse: Secondary | ICD-10-CM | POA: Diagnosis not present

## 2020-04-09 DIAGNOSIS — E1122 Type 2 diabetes mellitus with diabetic chronic kidney disease: Secondary | ICD-10-CM | POA: Diagnosis not present

## 2020-04-09 DIAGNOSIS — I495 Sick sinus syndrome: Secondary | ICD-10-CM | POA: Diagnosis not present

## 2020-04-09 DIAGNOSIS — D631 Anemia in chronic kidney disease: Secondary | ICD-10-CM | POA: Diagnosis not present

## 2020-04-09 DIAGNOSIS — M19041 Primary osteoarthritis, right hand: Secondary | ICD-10-CM | POA: Diagnosis not present

## 2020-04-09 DIAGNOSIS — M170 Bilateral primary osteoarthritis of knee: Secondary | ICD-10-CM | POA: Diagnosis not present

## 2020-04-09 DIAGNOSIS — M80051D Age-related osteoporosis with current pathological fracture, right femur, subsequent encounter for fracture with routine healing: Secondary | ICD-10-CM | POA: Diagnosis not present

## 2020-04-09 DIAGNOSIS — N183 Chronic kidney disease, stage 3 unspecified: Secondary | ICD-10-CM | POA: Diagnosis not present

## 2020-04-09 DIAGNOSIS — I442 Atrioventricular block, complete: Secondary | ICD-10-CM | POA: Diagnosis not present

## 2020-04-09 DIAGNOSIS — I4510 Unspecified right bundle-branch block: Secondary | ICD-10-CM | POA: Diagnosis not present

## 2020-04-10 ENCOUNTER — Other Ambulatory Visit: Payer: Self-pay

## 2020-04-10 DIAGNOSIS — M80051D Age-related osteoporosis with current pathological fracture, right femur, subsequent encounter for fracture with routine healing: Secondary | ICD-10-CM | POA: Diagnosis not present

## 2020-04-10 DIAGNOSIS — I4510 Unspecified right bundle-branch block: Secondary | ICD-10-CM | POA: Diagnosis not present

## 2020-04-10 DIAGNOSIS — I129 Hypertensive chronic kidney disease with stage 1 through stage 4 chronic kidney disease, or unspecified chronic kidney disease: Secondary | ICD-10-CM | POA: Diagnosis not present

## 2020-04-10 DIAGNOSIS — D631 Anemia in chronic kidney disease: Secondary | ICD-10-CM | POA: Diagnosis not present

## 2020-04-10 DIAGNOSIS — E1122 Type 2 diabetes mellitus with diabetic chronic kidney disease: Secondary | ICD-10-CM | POA: Diagnosis not present

## 2020-04-10 DIAGNOSIS — M170 Bilateral primary osteoarthritis of knee: Secondary | ICD-10-CM | POA: Diagnosis not present

## 2020-04-10 DIAGNOSIS — I442 Atrioventricular block, complete: Secondary | ICD-10-CM | POA: Diagnosis not present

## 2020-04-10 DIAGNOSIS — N183 Chronic kidney disease, stage 3 unspecified: Secondary | ICD-10-CM | POA: Diagnosis not present

## 2020-04-10 DIAGNOSIS — I495 Sick sinus syndrome: Secondary | ICD-10-CM | POA: Diagnosis not present

## 2020-04-10 NOTE — Patient Outreach (Signed)
[facility]) Care Management  04/10/2020  DIAMONTE STAVELY 10/15/1926 [id number]   Referral Date: 04/10/20 Referral Source: [employer] Report Date of Discharge: 04/07/20 Facility:  [facility] Insurance: [employer]   Referral received.  No outreach warranted at this time.  Transition of Care calls being completed via EMMI. RN CM will outreach patient for any red flags received.    Plan: RN CM will close case.    Bary Leriche, RN, MSN [facility] Care Management Care Management Coordinator Direct Line [phone] Toll Free: [phone]  Fax: [phone]

## 2020-04-11 LAB — CUP PACEART REMOTE DEVICE CHECK
Date Time Interrogation Session: 20220112075332
Implantable Lead Implant Date: 20170714
Implantable Lead Implant Date: 20170714
Implantable Lead Location: 753859
Implantable Lead Location: 753860
Implantable Lead Model: 377
Implantable Lead Model: 377
Implantable Lead Serial Number: 49489173
Implantable Lead Serial Number: 49549002
Implantable Pulse Generator Implant Date: 20170714
Pulse Gen Model: 394969
Pulse Gen Serial Number: 68764743

## 2020-04-17 DIAGNOSIS — M80051D Age-related osteoporosis with current pathological fracture, right femur, subsequent encounter for fracture with routine healing: Secondary | ICD-10-CM | POA: Diagnosis not present

## 2020-04-17 DIAGNOSIS — I442 Atrioventricular block, complete: Secondary | ICD-10-CM | POA: Diagnosis not present

## 2020-04-17 DIAGNOSIS — N183 Chronic kidney disease, stage 3 unspecified: Secondary | ICD-10-CM | POA: Diagnosis not present

## 2020-04-17 DIAGNOSIS — D631 Anemia in chronic kidney disease: Secondary | ICD-10-CM | POA: Diagnosis not present

## 2020-04-17 DIAGNOSIS — I129 Hypertensive chronic kidney disease with stage 1 through stage 4 chronic kidney disease, or unspecified chronic kidney disease: Secondary | ICD-10-CM | POA: Diagnosis not present

## 2020-04-17 DIAGNOSIS — I495 Sick sinus syndrome: Secondary | ICD-10-CM | POA: Diagnosis not present

## 2020-04-17 DIAGNOSIS — I4510 Unspecified right bundle-branch block: Secondary | ICD-10-CM | POA: Diagnosis not present

## 2020-04-17 DIAGNOSIS — M170 Bilateral primary osteoarthritis of knee: Secondary | ICD-10-CM | POA: Diagnosis not present

## 2020-04-17 DIAGNOSIS — E1122 Type 2 diabetes mellitus with diabetic chronic kidney disease: Secondary | ICD-10-CM | POA: Diagnosis not present

## 2020-04-20 ENCOUNTER — Other Ambulatory Visit: Payer: Self-pay

## 2020-04-20 ENCOUNTER — Emergency Department (HOSPITAL_COMMUNITY)
Admission: EM | Admit: 2020-04-20 | Discharge: 2020-04-20 | Disposition: A | Discharge status: Home / Self Care | Payer: Medicare HMO | Attending: Emergency Medicine | Admitting: Emergency Medicine

## 2020-04-20 ENCOUNTER — Emergency Department (HOSPITAL_COMMUNITY): Payer: Medicare HMO

## 2020-04-20 ENCOUNTER — Encounter (HOSPITAL_COMMUNITY): Payer: Self-pay | Admitting: Emergency Medicine

## 2020-04-20 DIAGNOSIS — M25551 Pain in right hip: Secondary | ICD-10-CM | POA: Diagnosis not present

## 2020-04-20 DIAGNOSIS — Z7984 Long term (current) use of oral hypoglycemic drugs: Secondary | ICD-10-CM | POA: Diagnosis not present

## 2020-04-20 DIAGNOSIS — Z950 Presence of cardiac pacemaker: Secondary | ICD-10-CM | POA: Insufficient documentation

## 2020-04-20 DIAGNOSIS — N320 Bladder-neck obstruction: Secondary | ICD-10-CM | POA: Diagnosis not present

## 2020-04-20 DIAGNOSIS — K8020 Calculus of gallbladder without cholecystitis without obstruction: Secondary | ICD-10-CM | POA: Diagnosis not present

## 2020-04-20 DIAGNOSIS — Z87891 Personal history of nicotine dependence: Secondary | ICD-10-CM | POA: Insufficient documentation

## 2020-04-20 DIAGNOSIS — E1122 Type 2 diabetes mellitus with diabetic chronic kidney disease: Secondary | ICD-10-CM | POA: Insufficient documentation

## 2020-04-20 DIAGNOSIS — K5641 Fecal impaction: Secondary | ICD-10-CM | POA: Insufficient documentation

## 2020-04-20 DIAGNOSIS — N183 Chronic kidney disease, stage 3 unspecified: Secondary | ICD-10-CM | POA: Insufficient documentation

## 2020-04-20 DIAGNOSIS — Z79899 Other long term (current) drug therapy: Secondary | ICD-10-CM | POA: Diagnosis not present

## 2020-04-20 DIAGNOSIS — I129 Hypertensive chronic kidney disease with stage 1 through stage 4 chronic kidney disease, or unspecified chronic kidney disease: Secondary | ICD-10-CM | POA: Insufficient documentation

## 2020-04-20 DIAGNOSIS — Z8673 Personal history of transient ischemic attack (TIA), and cerebral infarction without residual deficits: Secondary | ICD-10-CM | POA: Diagnosis not present

## 2020-04-20 DIAGNOSIS — N3289 Other specified disorders of bladder: Secondary | ICD-10-CM | POA: Diagnosis not present

## 2020-04-20 DIAGNOSIS — R109 Unspecified abdominal pain: Secondary | ICD-10-CM | POA: Diagnosis not present

## 2020-04-20 DIAGNOSIS — Z8616 Personal history of COVID-19: Secondary | ICD-10-CM | POA: Diagnosis not present

## 2020-04-20 DIAGNOSIS — K5900 Constipation, unspecified: Secondary | ICD-10-CM | POA: Diagnosis not present

## 2020-04-20 LAB — CBC WITH DIFFERENTIAL/PLATELET
Abs Immature Granulocytes: 0.02 10*3/uL (ref 0.00–0.07)
Basophils Absolute: 0.1 10*3/uL (ref 0.0–0.1)
Basophils Relative: 1 %
Eosinophils Absolute: 0.2 10*3/uL (ref 0.0–0.5)
Eosinophils Relative: 3 %
HCT: 38.2 % — ABNORMAL LOW (ref 39.0–52.0)
Hemoglobin: 12.6 g/dL — ABNORMAL LOW (ref 13.0–17.0)
Immature Granulocytes: 0 %
Lymphocytes Relative: 10 %
Lymphs Abs: 0.9 10*3/uL (ref 0.7–4.0)
MCH: 31.9 pg (ref 26.0–34.0)
MCHC: 33 g/dL (ref 30.0–36.0)
MCV: 96.7 fL (ref 80.0–100.0)
Monocytes Absolute: 0.6 10*3/uL (ref 0.1–1.0)
Monocytes Relative: 6 %
Neutro Abs: 7.7 10*3/uL (ref 1.7–7.7)
Neutrophils Relative %: 80 %
Platelets: 329 10*3/uL (ref 150–400)
RBC: 3.95 MIL/uL — ABNORMAL LOW (ref 4.22–5.81)
RDW: 15.1 % (ref 11.5–15.5)
WBC: 9.5 10*3/uL (ref 4.0–10.5)
nRBC: 0 % (ref 0.0–0.2)

## 2020-04-20 LAB — COMPREHENSIVE METABOLIC PANEL
ALT: 14 U/L (ref 0–44)
AST: 19 U/L (ref 15–41)
Albumin: 3.4 g/dL — ABNORMAL LOW (ref 3.5–5.0)
Alkaline Phosphatase: 93 U/L (ref 38–126)
Anion gap: 11 (ref 5–15)
BUN: 68 mg/dL — ABNORMAL HIGH (ref 8–23)
CO2: 26 mmol/L (ref 22–32)
Calcium: 9.1 mg/dL (ref 8.9–10.3)
Chloride: 99 mmol/L (ref 98–111)
Creatinine, Ser: 1.97 mg/dL — ABNORMAL HIGH (ref 0.61–1.24)
GFR, Estimated: 31 mL/min — ABNORMAL LOW (ref >60)
Glucose, Bld: 132 mg/dL — ABNORMAL HIGH (ref 70–99)
Potassium: 4 mmol/L (ref 3.5–5.1)
Sodium: 136 mmol/L (ref 135–145)
Total Bilirubin: 1.2 mg/dL (ref 0.3–1.2)
Total Protein: 6.7 g/dL (ref 6.5–8.1)

## 2020-04-20 MED ORDER — ACETAMINOPHEN 325 MG PO TABS
650.0000 mg | ORAL_TABLET | Freq: Once | ORAL | Status: AC
  Administered 2020-04-20: 650 mg via ORAL
  Filled 2020-04-20: qty 2

## 2020-04-20 NOTE — Discharge Instructions (Addendum)
Thank you for allowing me to care for you today in the Emergency Department.   Please stop taking ibuprofen/Motrin/Aleve, or any other medications that are called NSAIDs as these can be harmful to your kidneys.  Your kidney function was slightly elevated today.  I would like you to follow-up with primary care to have your blood work rechecked within the next week to make sure that it is improving after you avoid these medications.  The pain medication that you have been taking since her surgery can cause constipation.  You can resume taking the Linzess that was prescribed by primary care.  Make sure that you are drinking at least 64 ounces of water a day to prevent constipation.  You can also take an over-the-counter fiber supplement, such as Metamucil.  Taking MiraLAX daily can also help to prevent constipation.  If your symptoms continue to worsen, you can insert 1 bisacodyl suppository in the rectum to help with constipation.  Return to the emergency department if you have severe abdominal pain, if you become constipated and stop passing gas, develop uncontrollable vomiting, severe abdominal pain with high fevers, or other new, concerning symptoms.

## 2020-04-20 NOTE — ED Triage Notes (Signed)
Pt presents with R hip pain. Hx of fracture in same hip earlier this month. No new injury.

## 2020-04-20 NOTE — ED Provider Notes (Signed)
[facility]-EMERGENCY DEPT Provider Note   CSN: [id number] Arrival date & time: 04/20/20  [id number]     History Chief Complaint  Patient presents with  . Hip Pain    R    Noah Adkins is a 87 y.o. male with a history of gout, HTN, diabetes mellitus type 2, COVID-19, right hip fracture s/p IM nail 12/21 who presents to the emergency department with a chief complaint of constipation.  The patient underwent had a fall on December 5 and was found to have a right hip fracture s/p IM intertrochanteric nail on 12/6. Since that time, he has been on Percocet.  The patient reports that he has not had a bowel movement in 1 week. Over that time, he has had progressively worsening, constant, cramping abdominal pain. He has been unable to sleep due to the pain, and tonight he asked his wife to take him to the emergency department for further evaluation. No fever, chills, vomiting, diarrhea, dysuria, hematuria, difficulty urinating, cough, shortness of breath, chest pain, or leg swelling.  His wife reports that he was started on Linzess 3 days ago and has started to pass "wet gas", but has had no stool passed. He reports that he continues to have flatus. Earlier this week, they have tried multiple over-the-counter constipation medications. His wife reports that they have been attempting to treat his pain with Aleve without improvement. No enemas. No history of prior episodes of constipation. No history of prior abdominal surgeries.  The history is provided by the patient and medical records. No language interpreter was used.       Past Medical History:  Diagnosis Date  . Arthritis    "knees, hands" (10/10/2015)  . History of blood transfusion 1970s   "related to work related injury; broke rib; lost blood"  . History of gout   . Hypertension   . Kidney stones   . Presence of permanent cardiac pacemaker   . Type II diabetes mellitus [employer](HCC)     Patient Active Problem List    Diagnosis Date Noted  . Normochromic normocytic anemia 03/13/2020  . Real time reverse transcriptase PCR positive for severe acute respiratory syndrome coronavirus 2 (SARS-CoV-2) 03/13/2020  . History of gout 03/13/2020  . Closed hip fracture requiring operative repair, sequela 03/02/2020  . History of TIA (transient ischemic attack) 03/02/2020  . Essential hypertension 03/02/2020  . Osteoporosis 03/02/2020  . Hypokalemia 03/02/2020  . Hypocalcemia 03/02/2020  . CKD (chronic kidney disease), stage III (HCC) 03/02/2020  . Pacemaker 03/02/2020  . Type 2 diabetes mellitus with unspecified complications (HCC) 03/02/2020  . TIA (transient ischemic attack) 04/10/2017  . Syncopal seizure (HCC)   . Syncope 10/10/2015  . Symptomatic bradycardia 10/10/2015  . Postural hypotension 10/30/2013  . Preop cardiovascular exam 10/30/2013  . RBBB 10/30/2013    Past Surgical History:  Procedure Laterality Date  . CATARACT EXTRACTION Right 2015  . CYSTOSCOPY W/ STONE MANIPULATION  1970s  . EP IMPLANTABLE DEVICE N/A 10/10/2015   Procedure: Pacemaker Implant;  Surgeon: Marinus MawGregg W Taylor, MD;  [facility];  Service: Cardiovascular;  Laterality: N/A;  . INSERT / REPLACE / REMOVE PACEMAKER  10/10/2015  . INTRAMEDULLARY (IM) NAIL INTERTROCHANTERIC Right 03/03/2020   Procedure: INTRAMEDULLARY (IM) NAIL INTERTROCHANTRIC;  Surgeon: Samson FredericSwinteck, Brian, MD;  Location: [facility] ORS;  Service: Orthopedics;  Laterality: Right;       Family History  Problem Relation Age of Onset  . Hypertension Father   . Diabetes Mother  Social History   Tobacco Use  . Smoking status: Former Smoker    Years: 30.00    Types: Pipe, Cigars  . Smokeless tobacco: Former Neurosurgeon    Types: Chew  . Tobacco comment: 10/10/2015 "quit smoking cigarettes & chewing tobacco in the 1980s"  Vaping Use  . Vaping Use: Never used  Substance Use Topics  . Alcohol use: No    Alcohol/week: 0.0 standard drinks    Comment: 10/10/2015  "last year I had 2 beers"  . Drug use: No    Home Medications Prior to Admission medications   Medication Sig Start Date End Date Taking? Authorizing Provider  allopurinol (ZYLOPRIM) 100 MG tablet Take 1 tablet (100 mg total) by mouth daily. 04/04/20   Medina-Vargas, Monina C, NP  bisacodyl (DULCOLAX) 10 MG suppository Place 10 mg rectally daily as needed for moderate constipation.    [provider]  donepezil (ARICEPT) 10 MG tablet Take 1 tablet (10 mg total) by mouth daily. 04/04/20   Medina-Vargas, Monina C, NP  DULoxetine (CYMBALTA) 60 MG capsule Take 1 capsule (60 mg total) by mouth daily. 04/04/20   Medina-Vargas, Monina C, NP  fluticasone (FLONASE) 50 MCG/ACT nasal spray Place 1 spray into both nostrils daily. 04/04/20   Medina-Vargas, Monina C, NP  glipiZIDE (GLUCOTROL XL) 2.5 MG 24 hr tablet Take 1 tablet (2.5 mg total) by mouth daily. 04/04/20   Medina-Vargas, Monina C, NP  guaiFENesin-dextromethorphan (ROBITUSSIN DM) 100-10 MG/5ML syrup Take 5 mLs by mouth every 4 (four) hours as needed for cough.    [provider]  magnesium hydroxide (MILK OF MAGNESIA) 400 MG/5ML suspension Take by mouth daily as needed for mild constipation.    [provider]  Melatonin 3 MG CAPS Take 1 capsule (3 mg total) by mouth at bedtime. 04/04/20   Medina-Vargas, Monina C, NP  NON FORMULARY MED PASS SF DAILY 03/12/20   [provider]  omeprazole (PRILOSEC) 40 MG capsule Take 1 capsule (40 mg total) by mouth daily. 04/04/20   Medina-Vargas, Monina C, NP  oxybutynin (DITROPAN) 5 MG tablet Take 1 tablet (5 mg total) by mouth 2 (two) times daily. 04/04/20   Medina-Vargas, Monina C, NP  oxyCODONE (OXY IR/ROXICODONE) 5 MG immediate release tablet Take 1 tablet (5 mg total) by mouth every 8 (eight) hours as needed for severe pain. 04/04/20   Medina-Vargas, Monina C, NP  OXYGEN Inhale 2 L into the lungs continuous. 03/11/20   [provider]  simvastatin (ZOCOR) 10 MG tablet Take  1 tablet (10 mg total) by mouth at bedtime. 04/04/20   Medina-Vargas, Monina C, NP  Sodium Phosphates (RA SALINE ENEMA RE) Place 1 each rectally daily as needed (Constipation).    [provider]  tetrahydrozoline 0.05 % ophthalmic solution Place 1 drop into both eyes as needed (dry eyes).    [provider]  traZODone (DESYREL) 150 MG tablet Take 1 tablet (150 mg total) by mouth at bedtime. 04/04/20   Medina-Vargas, Monina C, NP    Allergies    Atenolol, Dilacor xr [diltiazem hcl], Isoptin sr [verapamil hcl er], Lopressor [metoprolol tartrate], and Norvasc [amlodipine besylate]  Review of Systems   Review of Systems  Constitutional: Negative for appetite change, chills and fever.  HENT: Negative for congestion and sore throat.   Respiratory: Negative for cough, shortness of breath and wheezing.   Cardiovascular: Negative for chest pain and palpitations.  Gastrointestinal: Positive for abdominal pain, constipation and rectal pain. Negative for diarrhea, nausea and  vomiting.  Genitourinary: Negative for dysuria.  Musculoskeletal: Negative for back pain, myalgias, neck pain and neck stiffness.  Skin: Negative for rash and wound.  Allergic/Immunologic: Negative for immunocompromised state.  Neurological: Negative for seizures, syncope, weakness, numbness and headaches.  Psychiatric/Behavioral: Negative for confusion.    Physical Exam Updated Vital Signs BP 121/66   Pulse 80   Resp 15   Ht 5\' 5"  (1.651 m)   Wt 68 kg   SpO2 95%   BMI 24.96 kg/m   Physical Exam Vitals and nursing note reviewed.  Constitutional:      Appearance: He is well-developed. He is not toxic-appearing.     Comments: Uncomfortable appearing.   HENT:     Head: Normocephalic.  Eyes:     Conjunctiva/sclera: Conjunctivae normal.  Cardiovascular:     Rate and Rhythm: Normal rate and regular rhythm.     Pulses: Normal pulses.     Heart sounds: Normal heart sounds. No murmur heard. No friction  rub. No gallop.   Pulmonary:     Effort: Pulmonary effort is normal. No respiratory distress.     Breath sounds: No stridor. No wheezing, rhonchi or rales.  Chest:     Chest wall: No tenderness.  Abdominal:     General: There is no distension.     Palpations: Abdomen is soft. There is no mass.     Tenderness: There is abdominal tenderness. There is no right CVA tenderness, left CVA tenderness, guarding or rebound.     Hernia: No hernia is present.     Comments: Diffusely tender to palpation throughout the abdomen.  Abdomen is soft and nondistended.  Hyperactive bowel sounds in all 4 quadrants.  No rebound or guarding.  Genitourinary:    Comments: Chaperoned exam.  A hard stool ball is felt in the rectal vault.  Normal rectal tone. Musculoskeletal:     Cervical back: Neck supple.     Right lower leg: No edema.     Left lower leg: No edema.  Skin:    General: Skin is warm and dry.  Neurological:     Mental Status: He is alert.  Psychiatric:        Behavior: Behavior normal.     ED Results / Procedures / Treatments   Labs (all labs ordered are listed, but only abnormal results are displayed) Labs Reviewed  CBC WITH DIFFERENTIAL/PLATELET - Abnormal; Notable for the following components:      Result Value   RBC 3.95 (*)    Hemoglobin 12.6 (*)    HCT 38.2 (*)    All other components within normal limits  COMPREHENSIVE METABOLIC PANEL - Abnormal; Notable for the following components:   Glucose, Bld 132 (*)    BUN 68 (*)    Creatinine, Ser 1.97 (*)    Albumin 3.4 (*)    GFR, Estimated 31 (*)    All other components within normal limits    EKG None  Radiology CT ABDOMEN PELVIS WO CONTRAST  Result Date: 04/20/2020 CLINICAL DATA:  Right hip pain.  Bowel obstruction suspected EXAM: CT ABDOMEN AND PELVIS WITHOUT CONTRAST TECHNIQUE: Multidetector CT imaging of the abdomen and pelvis was performed following the standard protocol without IV contrast. COMPARISON:  Right hip  radiography 03/03/2020. FINDINGS: Lower chest:  Dual-chamber pacer leads in place.  Atherosclerosis. Scarring or atelectasis at the lung bases. Hepatobiliary: No focal liver abnormality.Small volume layering high-density material in the gallbladder, likely small calculi. No evidence of acute cholecystitis. Pancreas: Unremarkable for  age Spleen: Unremarkable. Adrenals/Urinary Tract: Negative adrenals. Bilateral renal atrophy. 3.1 cm high-density lesion exophytic from the lower right kidney. This correlates with a mildly complicated cyst on 2019 ultrasound with mild size enlargement from that time. Other renal lesions on both sides are cystic density or too small for accurate densitometry. No hydronephrosis or ureteral calculus. Bilateral laterally projecting bladder diverticula which generalized wall thickening of the bladder. Bladder is moderately distended today. Stomach/Bowel: Negative for bowel obstruction. Moderate stool retention mainly noted at the distended rectum. No superimposed rectal inflammation. Rectal diameter is 7.6 cm. Vascular/Lymphatic: No acute vascular abnormality. No mass or adenopathy. Reproductive:Mildly enlarged prostate which has a symmetric appearance. Other: No ascites or pneumoperitoneum. Musculoskeletal: No acute abnormalities. Advanced lumbar disc and facet degeneration. Intertrochanteric right femur fracture with signs of early healing. No evident hardware displacement or loosening. IMPRESSION: 1. Moderate stool retention, mainly at the distended rectum, without obstruction or visible inflammation. 2. 3.1 cm right renal lesion which which is likely a hemorrhagic cyst based on 2019 abdominal ultrasound, with interval enlargement from that time. 3. Sequela of chronic bladder outlet obstruction with wall thickening and diverticula. The bladder is moderately distended today. 4. Cholelithiasis.  No acute cholecystitis. Electronically Signed   By: Marnee Spring M.D.   On: 04/20/2020 06:06    DG Abdomen Acute W/Chest  Result Date: 04/20/2020 CLINICAL DATA:  Constipation, right hip pain EXAM: DG ABDOMEN ACUTE WITH 1 VIEW CHEST COMPARISON:  None. FINDINGS: There is no evidence of dilated bowel loops or free intraperitoneal air. No radiopaque calculi or other significant radiographic abnormality is seen. Heart size and mediastinal contours are within normal limits. Left subclavian dual lead pacemaker is in place with leads overlying the expected right atrium and right ventricle. Both lungs are clear. Degenerative changes are seen within the lumbar spine. Right femoral neck ORIF has been performed. IMPRESSION: Negative abdominal radiographs.  No acute cardiopulmonary disease. Electronically Signed   By: Helyn Numbers MD   On: 04/20/2020 04:07    Procedures Fecal disimpaction  Date/Time: 04/20/2020 6:15 AM Performed by: Barkley Boards, PA-C Authorized by: Barkley Boards, PA-C  Consent: Verbal consent obtained. Consent given by: patient and guardian Patient understanding: patient states understanding of the procedure being performed Patient consent: the patient's understanding of the procedure matches consent given Patient identity confirmed: verbally with patient and arm band Patient tolerance: patient tolerated the procedure well with no immediate complications    (including critical care time)  Medications Ordered in ED Medications  acetaminophen (TYLENOL) tablet 650 mg (650 mg Oral Given 04/20/20 0524)    ED Course  I have reviewed the triage vital signs and the nursing notes.  Pertinent labs & imaging results that were available during my care of the patient were reviewed by me and considered in my medical decision making (see chart for details).    MDM Rules/Calculators/A&P                          87 year old male with a history of gout, HTN, diabetes mellitus type 2, COVID-19, right hip fracture s/p IM nail 12/21 who presents to the emergency department with  abdominal pain and constipation despite starting Linzess and over-the-counter constipation treatments at home.  No history of abdominal surgery or bowel obstruction.  Vital signs are reassuring.  The patient was seen and independently evaluated by Dr. Pilar Plate, attending physician.  Differential diagnosis includes diverticulitis, bowel obstruction, ileus, stercoral colitis, pyelonephritis, cholecystitis.  Labs and imaging have been reviewed and independently interpreted by me.  Creatinine is 1.87, up from 1.82-month ago.  I suspect that this may be from NSAID use as he has been taking Aleve at home for abdominal pain secondary to constipation in addition to his home Percocet.  Since BUN is also elevated, there is likely a component of dehydration.  However, patient is tolerating p.o. fluids.  We defer IV fluids at this time.  No metabolic derangements.  Hemoglobin is stable.  Abdominal x-ray is unremarkable.  No evidence of free air.  CT with moderate stool retention mainly at the distended rectum.  No obvious obstruction or visible inflammation.  He has a 3.1 cm right renal region that is likely a hemorrhagic cyst based on previous imaging.  Discussed with the patient and his wife at bedside that the patient would likely benefit from fecal impaction.  Risk and benefits discussed.  They would like to proceed with procedure.  Bedside fecal impaction was performed by me.  A large amount of brown stool was removed from the rectal vault.  Following which, patient reported significant improvement in his pain.  I discussed with family that his symptoms are most likely secondary to opioid use from his procedure.  At home, recommended increase fluid intake, Metamucil, and MiraLAX as needed.  I have also advised that they follow-up with primary care for recheck of his labs.  Family reports that they have an appointment with his PCP in 2 days, which I encouraged him to keep.  All questions answered.  The patient is  hemodynamically stable no acute distress.  Safe for discharge home with outpatient follow-up as indicated.  Final Clinical Impression(s) / ED Diagnoses Final diagnoses:  Fecal impaction [facility])    Rx / DC Orders ED Discharge Orders    None       Barkley Boards, PA-C 04/20/20 0848    Sabas Sous, MD 04/20/20 [id number]

## 2020-04-21 DIAGNOSIS — I442 Atrioventricular block, complete: Secondary | ICD-10-CM | POA: Diagnosis not present

## 2020-04-21 DIAGNOSIS — M80051D Age-related osteoporosis with current pathological fracture, right femur, subsequent encounter for fracture with routine healing: Secondary | ICD-10-CM | POA: Diagnosis not present

## 2020-04-21 DIAGNOSIS — N183 Chronic kidney disease, stage 3 unspecified: Secondary | ICD-10-CM | POA: Diagnosis not present

## 2020-04-21 DIAGNOSIS — M170 Bilateral primary osteoarthritis of knee: Secondary | ICD-10-CM | POA: Diagnosis not present

## 2020-04-21 DIAGNOSIS — I495 Sick sinus syndrome: Secondary | ICD-10-CM | POA: Diagnosis not present

## 2020-04-21 DIAGNOSIS — I4510 Unspecified right bundle-branch block: Secondary | ICD-10-CM | POA: Diagnosis not present

## 2020-04-21 DIAGNOSIS — E1122 Type 2 diabetes mellitus with diabetic chronic kidney disease: Secondary | ICD-10-CM | POA: Diagnosis not present

## 2020-04-21 DIAGNOSIS — D631 Anemia in chronic kidney disease: Secondary | ICD-10-CM | POA: Diagnosis not present

## 2020-04-21 DIAGNOSIS — I129 Hypertensive chronic kidney disease with stage 1 through stage 4 chronic kidney disease, or unspecified chronic kidney disease: Secondary | ICD-10-CM | POA: Diagnosis not present

## 2020-04-22 DIAGNOSIS — Z8781 Personal history of (healed) traumatic fracture: Secondary | ICD-10-CM | POA: Diagnosis not present

## 2020-04-22 DIAGNOSIS — N183 Chronic kidney disease, stage 3 unspecified: Secondary | ICD-10-CM | POA: Diagnosis not present

## 2020-04-22 DIAGNOSIS — I4510 Unspecified right bundle-branch block: Secondary | ICD-10-CM | POA: Diagnosis not present

## 2020-04-22 DIAGNOSIS — Z7984 Long term (current) use of oral hypoglycemic drugs: Secondary | ICD-10-CM | POA: Diagnosis not present

## 2020-04-22 DIAGNOSIS — M170 Bilateral primary osteoarthritis of knee: Secondary | ICD-10-CM | POA: Diagnosis not present

## 2020-04-22 DIAGNOSIS — M80051D Age-related osteoporosis with current pathological fracture, right femur, subsequent encounter for fracture with routine healing: Secondary | ICD-10-CM | POA: Diagnosis not present

## 2020-04-22 DIAGNOSIS — E1122 Type 2 diabetes mellitus with diabetic chronic kidney disease: Secondary | ICD-10-CM | POA: Diagnosis not present

## 2020-04-22 DIAGNOSIS — D631 Anemia in chronic kidney disease: Secondary | ICD-10-CM | POA: Diagnosis not present

## 2020-04-22 DIAGNOSIS — E440 Moderate protein-calorie malnutrition: Secondary | ICD-10-CM | POA: Diagnosis not present

## 2020-04-22 DIAGNOSIS — I442 Atrioventricular block, complete: Secondary | ICD-10-CM | POA: Diagnosis not present

## 2020-04-22 DIAGNOSIS — N1832 Chronic kidney disease, stage 3b: Secondary | ICD-10-CM | POA: Diagnosis not present

## 2020-04-22 DIAGNOSIS — Z9889 Other specified postprocedural states: Secondary | ICD-10-CM | POA: Diagnosis not present

## 2020-04-22 DIAGNOSIS — I129 Hypertensive chronic kidney disease with stage 1 through stage 4 chronic kidney disease, or unspecified chronic kidney disease: Secondary | ICD-10-CM | POA: Diagnosis not present

## 2020-04-22 DIAGNOSIS — I495 Sick sinus syndrome: Secondary | ICD-10-CM | POA: Diagnosis not present

## 2020-04-22 DIAGNOSIS — F320 Major depressive disorder, single episode, mild: Secondary | ICD-10-CM | POA: Diagnosis not present

## 2020-04-23 DIAGNOSIS — I442 Atrioventricular block, complete: Secondary | ICD-10-CM | POA: Diagnosis not present

## 2020-04-23 DIAGNOSIS — I495 Sick sinus syndrome: Secondary | ICD-10-CM | POA: Diagnosis not present

## 2020-04-23 DIAGNOSIS — N183 Chronic kidney disease, stage 3 unspecified: Secondary | ICD-10-CM | POA: Diagnosis not present

## 2020-04-23 DIAGNOSIS — M170 Bilateral primary osteoarthritis of knee: Secondary | ICD-10-CM | POA: Diagnosis not present

## 2020-04-23 DIAGNOSIS — I129 Hypertensive chronic kidney disease with stage 1 through stage 4 chronic kidney disease, or unspecified chronic kidney disease: Secondary | ICD-10-CM | POA: Diagnosis not present

## 2020-04-23 DIAGNOSIS — M80051D Age-related osteoporosis with current pathological fracture, right femur, subsequent encounter for fracture with routine healing: Secondary | ICD-10-CM | POA: Diagnosis not present

## 2020-04-23 DIAGNOSIS — E1122 Type 2 diabetes mellitus with diabetic chronic kidney disease: Secondary | ICD-10-CM | POA: Diagnosis not present

## 2020-04-23 DIAGNOSIS — D631 Anemia in chronic kidney disease: Secondary | ICD-10-CM | POA: Diagnosis not present

## 2020-04-23 DIAGNOSIS — I4510 Unspecified right bundle-branch block: Secondary | ICD-10-CM | POA: Diagnosis not present

## 2020-04-23 NOTE — Progress Notes (Signed)
Remote pacemaker transmission.   

## 2020-04-28 DIAGNOSIS — I442 Atrioventricular block, complete: Secondary | ICD-10-CM | POA: Diagnosis not present

## 2020-04-28 DIAGNOSIS — M80051D Age-related osteoporosis with current pathological fracture, right femur, subsequent encounter for fracture with routine healing: Secondary | ICD-10-CM | POA: Diagnosis not present

## 2020-04-28 DIAGNOSIS — M170 Bilateral primary osteoarthritis of knee: Secondary | ICD-10-CM | POA: Diagnosis not present

## 2020-04-28 DIAGNOSIS — I495 Sick sinus syndrome: Secondary | ICD-10-CM | POA: Diagnosis not present

## 2020-04-28 DIAGNOSIS — D631 Anemia in chronic kidney disease: Secondary | ICD-10-CM | POA: Diagnosis not present

## 2020-04-28 DIAGNOSIS — E1122 Type 2 diabetes mellitus with diabetic chronic kidney disease: Secondary | ICD-10-CM | POA: Diagnosis not present

## 2020-04-28 DIAGNOSIS — N183 Chronic kidney disease, stage 3 unspecified: Secondary | ICD-10-CM | POA: Diagnosis not present

## 2020-04-28 DIAGNOSIS — I129 Hypertensive chronic kidney disease with stage 1 through stage 4 chronic kidney disease, or unspecified chronic kidney disease: Secondary | ICD-10-CM | POA: Diagnosis not present

## 2020-04-28 DIAGNOSIS — I4510 Unspecified right bundle-branch block: Secondary | ICD-10-CM | POA: Diagnosis not present

## 2020-04-29 DIAGNOSIS — M80051D Age-related osteoporosis with current pathological fracture, right femur, subsequent encounter for fracture with routine healing: Secondary | ICD-10-CM | POA: Diagnosis not present

## 2020-04-29 DIAGNOSIS — D631 Anemia in chronic kidney disease: Secondary | ICD-10-CM | POA: Diagnosis not present

## 2020-04-29 DIAGNOSIS — N183 Chronic kidney disease, stage 3 unspecified: Secondary | ICD-10-CM | POA: Diagnosis not present

## 2020-04-29 DIAGNOSIS — I442 Atrioventricular block, complete: Secondary | ICD-10-CM | POA: Diagnosis not present

## 2020-04-29 DIAGNOSIS — I129 Hypertensive chronic kidney disease with stage 1 through stage 4 chronic kidney disease, or unspecified chronic kidney disease: Secondary | ICD-10-CM | POA: Diagnosis not present

## 2020-04-29 DIAGNOSIS — I4510 Unspecified right bundle-branch block: Secondary | ICD-10-CM | POA: Diagnosis not present

## 2020-04-29 DIAGNOSIS — I495 Sick sinus syndrome: Secondary | ICD-10-CM | POA: Diagnosis not present

## 2020-04-29 DIAGNOSIS — M170 Bilateral primary osteoarthritis of knee: Secondary | ICD-10-CM | POA: Diagnosis not present

## 2020-04-29 DIAGNOSIS — E1122 Type 2 diabetes mellitus with diabetic chronic kidney disease: Secondary | ICD-10-CM | POA: Diagnosis not present

## 2020-04-30 DIAGNOSIS — I4510 Unspecified right bundle-branch block: Secondary | ICD-10-CM | POA: Diagnosis not present

## 2020-04-30 DIAGNOSIS — I442 Atrioventricular block, complete: Secondary | ICD-10-CM | POA: Diagnosis not present

## 2020-04-30 DIAGNOSIS — M80051D Age-related osteoporosis with current pathological fracture, right femur, subsequent encounter for fracture with routine healing: Secondary | ICD-10-CM | POA: Diagnosis not present

## 2020-04-30 DIAGNOSIS — N183 Chronic kidney disease, stage 3 unspecified: Secondary | ICD-10-CM | POA: Diagnosis not present

## 2020-04-30 DIAGNOSIS — I495 Sick sinus syndrome: Secondary | ICD-10-CM | POA: Diagnosis not present

## 2020-04-30 DIAGNOSIS — E1122 Type 2 diabetes mellitus with diabetic chronic kidney disease: Secondary | ICD-10-CM | POA: Diagnosis not present

## 2020-04-30 DIAGNOSIS — D631 Anemia in chronic kidney disease: Secondary | ICD-10-CM | POA: Diagnosis not present

## 2020-04-30 DIAGNOSIS — M170 Bilateral primary osteoarthritis of knee: Secondary | ICD-10-CM | POA: Diagnosis not present

## 2020-04-30 DIAGNOSIS — I129 Hypertensive chronic kidney disease with stage 1 through stage 4 chronic kidney disease, or unspecified chronic kidney disease: Secondary | ICD-10-CM | POA: Diagnosis not present

## 2020-05-06 DIAGNOSIS — I129 Hypertensive chronic kidney disease with stage 1 through stage 4 chronic kidney disease, or unspecified chronic kidney disease: Secondary | ICD-10-CM | POA: Diagnosis not present

## 2020-05-06 DIAGNOSIS — I442 Atrioventricular block, complete: Secondary | ICD-10-CM | POA: Diagnosis not present

## 2020-05-06 DIAGNOSIS — N183 Chronic kidney disease, stage 3 unspecified: Secondary | ICD-10-CM | POA: Diagnosis not present

## 2020-05-06 DIAGNOSIS — I4510 Unspecified right bundle-branch block: Secondary | ICD-10-CM | POA: Diagnosis not present

## 2020-05-06 DIAGNOSIS — M170 Bilateral primary osteoarthritis of knee: Secondary | ICD-10-CM | POA: Diagnosis not present

## 2020-05-06 DIAGNOSIS — M80051D Age-related osteoporosis with current pathological fracture, right femur, subsequent encounter for fracture with routine healing: Secondary | ICD-10-CM | POA: Diagnosis not present

## 2020-05-06 DIAGNOSIS — E1122 Type 2 diabetes mellitus with diabetic chronic kidney disease: Secondary | ICD-10-CM | POA: Diagnosis not present

## 2020-05-06 DIAGNOSIS — D631 Anemia in chronic kidney disease: Secondary | ICD-10-CM | POA: Diagnosis not present

## 2020-05-06 DIAGNOSIS — I495 Sick sinus syndrome: Secondary | ICD-10-CM | POA: Diagnosis not present

## 2020-05-08 DIAGNOSIS — R1312 Dysphagia, oropharyngeal phase: Secondary | ICD-10-CM | POA: Diagnosis not present

## 2020-05-08 DIAGNOSIS — F0390 Unspecified dementia without behavioral disturbance: Secondary | ICD-10-CM | POA: Diagnosis not present

## 2020-05-09 DIAGNOSIS — E1122 Type 2 diabetes mellitus with diabetic chronic kidney disease: Secondary | ICD-10-CM | POA: Diagnosis not present

## 2020-05-09 DIAGNOSIS — D631 Anemia in chronic kidney disease: Secondary | ICD-10-CM | POA: Diagnosis not present

## 2020-05-09 DIAGNOSIS — I495 Sick sinus syndrome: Secondary | ICD-10-CM | POA: Diagnosis not present

## 2020-05-09 DIAGNOSIS — N183 Chronic kidney disease, stage 3 unspecified: Secondary | ICD-10-CM | POA: Diagnosis not present

## 2020-05-09 DIAGNOSIS — M80051D Age-related osteoporosis with current pathological fracture, right femur, subsequent encounter for fracture with routine healing: Secondary | ICD-10-CM | POA: Diagnosis not present

## 2020-05-09 DIAGNOSIS — I442 Atrioventricular block, complete: Secondary | ICD-10-CM | POA: Diagnosis not present

## 2020-05-09 DIAGNOSIS — I129 Hypertensive chronic kidney disease with stage 1 through stage 4 chronic kidney disease, or unspecified chronic kidney disease: Secondary | ICD-10-CM | POA: Diagnosis not present

## 2020-05-09 DIAGNOSIS — M170 Bilateral primary osteoarthritis of knee: Secondary | ICD-10-CM | POA: Diagnosis not present

## 2020-05-09 DIAGNOSIS — I4510 Unspecified right bundle-branch block: Secondary | ICD-10-CM | POA: Diagnosis not present

## 2020-05-12 DIAGNOSIS — I442 Atrioventricular block, complete: Secondary | ICD-10-CM | POA: Diagnosis not present

## 2020-05-12 DIAGNOSIS — E1122 Type 2 diabetes mellitus with diabetic chronic kidney disease: Secondary | ICD-10-CM | POA: Diagnosis not present

## 2020-05-12 DIAGNOSIS — N183 Chronic kidney disease, stage 3 unspecified: Secondary | ICD-10-CM | POA: Diagnosis not present

## 2020-05-12 DIAGNOSIS — D631 Anemia in chronic kidney disease: Secondary | ICD-10-CM | POA: Diagnosis not present

## 2020-05-12 DIAGNOSIS — I129 Hypertensive chronic kidney disease with stage 1 through stage 4 chronic kidney disease, or unspecified chronic kidney disease: Secondary | ICD-10-CM | POA: Diagnosis not present

## 2020-05-12 DIAGNOSIS — M170 Bilateral primary osteoarthritis of knee: Secondary | ICD-10-CM | POA: Diagnosis not present

## 2020-05-12 DIAGNOSIS — M80051D Age-related osteoporosis with current pathological fracture, right femur, subsequent encounter for fracture with routine healing: Secondary | ICD-10-CM | POA: Diagnosis not present

## 2020-05-12 DIAGNOSIS — I495 Sick sinus syndrome: Secondary | ICD-10-CM | POA: Diagnosis not present

## 2020-05-12 DIAGNOSIS — I4510 Unspecified right bundle-branch block: Secondary | ICD-10-CM | POA: Diagnosis not present

## 2020-05-13 DIAGNOSIS — E1122 Type 2 diabetes mellitus with diabetic chronic kidney disease: Secondary | ICD-10-CM | POA: Diagnosis not present

## 2020-05-13 DIAGNOSIS — N183 Chronic kidney disease, stage 3 unspecified: Secondary | ICD-10-CM | POA: Diagnosis not present

## 2020-05-13 DIAGNOSIS — D631 Anemia in chronic kidney disease: Secondary | ICD-10-CM | POA: Diagnosis not present

## 2020-05-13 DIAGNOSIS — M80051D Age-related osteoporosis with current pathological fracture, right femur, subsequent encounter for fracture with routine healing: Secondary | ICD-10-CM | POA: Diagnosis not present

## 2020-05-13 DIAGNOSIS — I442 Atrioventricular block, complete: Secondary | ICD-10-CM | POA: Diagnosis not present

## 2020-05-13 DIAGNOSIS — I495 Sick sinus syndrome: Secondary | ICD-10-CM | POA: Diagnosis not present

## 2020-05-13 DIAGNOSIS — I129 Hypertensive chronic kidney disease with stage 1 through stage 4 chronic kidney disease, or unspecified chronic kidney disease: Secondary | ICD-10-CM | POA: Diagnosis not present

## 2020-05-13 DIAGNOSIS — M170 Bilateral primary osteoarthritis of knee: Secondary | ICD-10-CM | POA: Diagnosis not present

## 2020-05-13 DIAGNOSIS — I4510 Unspecified right bundle-branch block: Secondary | ICD-10-CM | POA: Diagnosis not present

## 2020-05-19 DIAGNOSIS — I4510 Unspecified right bundle-branch block: Secondary | ICD-10-CM | POA: Diagnosis not present

## 2020-05-19 DIAGNOSIS — I442 Atrioventricular block, complete: Secondary | ICD-10-CM | POA: Diagnosis not present

## 2020-05-19 DIAGNOSIS — D631 Anemia in chronic kidney disease: Secondary | ICD-10-CM | POA: Diagnosis not present

## 2020-05-19 DIAGNOSIS — M80051D Age-related osteoporosis with current pathological fracture, right femur, subsequent encounter for fracture with routine healing: Secondary | ICD-10-CM | POA: Diagnosis not present

## 2020-05-19 DIAGNOSIS — N183 Chronic kidney disease, stage 3 unspecified: Secondary | ICD-10-CM | POA: Diagnosis not present

## 2020-05-19 DIAGNOSIS — I129 Hypertensive chronic kidney disease with stage 1 through stage 4 chronic kidney disease, or unspecified chronic kidney disease: Secondary | ICD-10-CM | POA: Diagnosis not present

## 2020-05-19 DIAGNOSIS — M170 Bilateral primary osteoarthritis of knee: Secondary | ICD-10-CM | POA: Diagnosis not present

## 2020-05-19 DIAGNOSIS — I495 Sick sinus syndrome: Secondary | ICD-10-CM | POA: Diagnosis not present

## 2020-05-19 DIAGNOSIS — E1122 Type 2 diabetes mellitus with diabetic chronic kidney disease: Secondary | ICD-10-CM | POA: Diagnosis not present

## 2020-05-20 DIAGNOSIS — M80051D Age-related osteoporosis with current pathological fracture, right femur, subsequent encounter for fracture with routine healing: Secondary | ICD-10-CM | POA: Diagnosis not present

## 2020-05-20 DIAGNOSIS — N183 Chronic kidney disease, stage 3 unspecified: Secondary | ICD-10-CM | POA: Diagnosis not present

## 2020-05-20 DIAGNOSIS — I129 Hypertensive chronic kidney disease with stage 1 through stage 4 chronic kidney disease, or unspecified chronic kidney disease: Secondary | ICD-10-CM | POA: Diagnosis not present

## 2020-05-20 DIAGNOSIS — I495 Sick sinus syndrome: Secondary | ICD-10-CM | POA: Diagnosis not present

## 2020-05-20 DIAGNOSIS — E1122 Type 2 diabetes mellitus with diabetic chronic kidney disease: Secondary | ICD-10-CM | POA: Diagnosis not present

## 2020-05-20 DIAGNOSIS — D631 Anemia in chronic kidney disease: Secondary | ICD-10-CM | POA: Diagnosis not present

## 2020-05-20 DIAGNOSIS — I4510 Unspecified right bundle-branch block: Secondary | ICD-10-CM | POA: Diagnosis not present

## 2020-05-20 DIAGNOSIS — I442 Atrioventricular block, complete: Secondary | ICD-10-CM | POA: Diagnosis not present

## 2020-05-20 DIAGNOSIS — M170 Bilateral primary osteoarthritis of knee: Secondary | ICD-10-CM | POA: Diagnosis not present

## 2020-05-26 DIAGNOSIS — E1122 Type 2 diabetes mellitus with diabetic chronic kidney disease: Secondary | ICD-10-CM | POA: Diagnosis not present

## 2020-05-26 DIAGNOSIS — I129 Hypertensive chronic kidney disease with stage 1 through stage 4 chronic kidney disease, or unspecified chronic kidney disease: Secondary | ICD-10-CM | POA: Diagnosis not present

## 2020-05-26 DIAGNOSIS — M170 Bilateral primary osteoarthritis of knee: Secondary | ICD-10-CM | POA: Diagnosis not present

## 2020-05-26 DIAGNOSIS — M80051D Age-related osteoporosis with current pathological fracture, right femur, subsequent encounter for fracture with routine healing: Secondary | ICD-10-CM | POA: Diagnosis not present

## 2020-05-26 DIAGNOSIS — I442 Atrioventricular block, complete: Secondary | ICD-10-CM | POA: Diagnosis not present

## 2020-05-26 DIAGNOSIS — I4510 Unspecified right bundle-branch block: Secondary | ICD-10-CM | POA: Diagnosis not present

## 2020-05-26 DIAGNOSIS — N183 Chronic kidney disease, stage 3 unspecified: Secondary | ICD-10-CM | POA: Diagnosis not present

## 2020-05-26 DIAGNOSIS — D631 Anemia in chronic kidney disease: Secondary | ICD-10-CM | POA: Diagnosis not present

## 2020-05-26 DIAGNOSIS — I495 Sick sinus syndrome: Secondary | ICD-10-CM | POA: Diagnosis not present

## 2020-05-27 DIAGNOSIS — M170 Bilateral primary osteoarthritis of knee: Secondary | ICD-10-CM | POA: Diagnosis not present

## 2020-05-27 DIAGNOSIS — I495 Sick sinus syndrome: Secondary | ICD-10-CM | POA: Diagnosis not present

## 2020-05-27 DIAGNOSIS — I129 Hypertensive chronic kidney disease with stage 1 through stage 4 chronic kidney disease, or unspecified chronic kidney disease: Secondary | ICD-10-CM | POA: Diagnosis not present

## 2020-05-27 DIAGNOSIS — N183 Chronic kidney disease, stage 3 unspecified: Secondary | ICD-10-CM | POA: Diagnosis not present

## 2020-05-27 DIAGNOSIS — I4510 Unspecified right bundle-branch block: Secondary | ICD-10-CM | POA: Diagnosis not present

## 2020-05-27 DIAGNOSIS — M80051D Age-related osteoporosis with current pathological fracture, right femur, subsequent encounter for fracture with routine healing: Secondary | ICD-10-CM | POA: Diagnosis not present

## 2020-05-27 DIAGNOSIS — E1122 Type 2 diabetes mellitus with diabetic chronic kidney disease: Secondary | ICD-10-CM | POA: Diagnosis not present

## 2020-05-27 DIAGNOSIS — I442 Atrioventricular block, complete: Secondary | ICD-10-CM | POA: Diagnosis not present

## 2020-05-27 DIAGNOSIS — D631 Anemia in chronic kidney disease: Secondary | ICD-10-CM | POA: Diagnosis not present

## 2020-06-03 DIAGNOSIS — I442 Atrioventricular block, complete: Secondary | ICD-10-CM | POA: Diagnosis not present

## 2020-06-03 DIAGNOSIS — I129 Hypertensive chronic kidney disease with stage 1 through stage 4 chronic kidney disease, or unspecified chronic kidney disease: Secondary | ICD-10-CM | POA: Diagnosis not present

## 2020-06-03 DIAGNOSIS — N183 Chronic kidney disease, stage 3 unspecified: Secondary | ICD-10-CM | POA: Diagnosis not present

## 2020-06-03 DIAGNOSIS — M80051D Age-related osteoporosis with current pathological fracture, right femur, subsequent encounter for fracture with routine healing: Secondary | ICD-10-CM | POA: Diagnosis not present

## 2020-06-03 DIAGNOSIS — I495 Sick sinus syndrome: Secondary | ICD-10-CM | POA: Diagnosis not present

## 2020-06-03 DIAGNOSIS — E1122 Type 2 diabetes mellitus with diabetic chronic kidney disease: Secondary | ICD-10-CM | POA: Diagnosis not present

## 2020-06-03 DIAGNOSIS — I4510 Unspecified right bundle-branch block: Secondary | ICD-10-CM | POA: Diagnosis not present

## 2020-06-03 DIAGNOSIS — D631 Anemia in chronic kidney disease: Secondary | ICD-10-CM | POA: Diagnosis not present

## 2020-06-03 DIAGNOSIS — M170 Bilateral primary osteoarthritis of knee: Secondary | ICD-10-CM | POA: Diagnosis not present

## 2020-06-05 DIAGNOSIS — F0390 Unspecified dementia without behavioral disturbance: Secondary | ICD-10-CM | POA: Diagnosis not present

## 2020-06-05 DIAGNOSIS — R1312 Dysphagia, oropharyngeal phase: Secondary | ICD-10-CM | POA: Diagnosis not present

## 2020-06-08 DIAGNOSIS — D631 Anemia in chronic kidney disease: Secondary | ICD-10-CM | POA: Diagnosis not present

## 2020-06-08 DIAGNOSIS — I129 Hypertensive chronic kidney disease with stage 1 through stage 4 chronic kidney disease, or unspecified chronic kidney disease: Secondary | ICD-10-CM | POA: Diagnosis not present

## 2020-06-08 DIAGNOSIS — I4510 Unspecified right bundle-branch block: Secondary | ICD-10-CM | POA: Diagnosis not present

## 2020-06-08 DIAGNOSIS — E1122 Type 2 diabetes mellitus with diabetic chronic kidney disease: Secondary | ICD-10-CM | POA: Diagnosis not present

## 2020-06-08 DIAGNOSIS — N183 Chronic kidney disease, stage 3 unspecified: Secondary | ICD-10-CM | POA: Diagnosis not present

## 2020-06-08 DIAGNOSIS — M170 Bilateral primary osteoarthritis of knee: Secondary | ICD-10-CM | POA: Diagnosis not present

## 2020-06-08 DIAGNOSIS — I442 Atrioventricular block, complete: Secondary | ICD-10-CM | POA: Diagnosis not present

## 2020-06-08 DIAGNOSIS — I495 Sick sinus syndrome: Secondary | ICD-10-CM | POA: Diagnosis not present

## 2020-06-08 DIAGNOSIS — M80051D Age-related osteoporosis with current pathological fracture, right femur, subsequent encounter for fracture with routine healing: Secondary | ICD-10-CM | POA: Diagnosis not present

## 2020-06-11 DIAGNOSIS — I442 Atrioventricular block, complete: Secondary | ICD-10-CM | POA: Diagnosis not present

## 2020-06-11 DIAGNOSIS — E1122 Type 2 diabetes mellitus with diabetic chronic kidney disease: Secondary | ICD-10-CM | POA: Diagnosis not present

## 2020-06-11 DIAGNOSIS — M80051D Age-related osteoporosis with current pathological fracture, right femur, subsequent encounter for fracture with routine healing: Secondary | ICD-10-CM | POA: Diagnosis not present

## 2020-06-11 DIAGNOSIS — I129 Hypertensive chronic kidney disease with stage 1 through stage 4 chronic kidney disease, or unspecified chronic kidney disease: Secondary | ICD-10-CM | POA: Diagnosis not present

## 2020-06-11 DIAGNOSIS — N183 Chronic kidney disease, stage 3 unspecified: Secondary | ICD-10-CM | POA: Diagnosis not present

## 2020-06-11 DIAGNOSIS — I4510 Unspecified right bundle-branch block: Secondary | ICD-10-CM | POA: Diagnosis not present

## 2020-06-11 DIAGNOSIS — M170 Bilateral primary osteoarthritis of knee: Secondary | ICD-10-CM | POA: Diagnosis not present

## 2020-06-11 DIAGNOSIS — I495 Sick sinus syndrome: Secondary | ICD-10-CM | POA: Diagnosis not present

## 2020-06-11 DIAGNOSIS — D631 Anemia in chronic kidney disease: Secondary | ICD-10-CM | POA: Diagnosis not present

## 2020-06-12 DIAGNOSIS — N183 Chronic kidney disease, stage 3 unspecified: Secondary | ICD-10-CM | POA: Diagnosis not present

## 2020-06-12 DIAGNOSIS — I495 Sick sinus syndrome: Secondary | ICD-10-CM | POA: Diagnosis not present

## 2020-06-12 DIAGNOSIS — I442 Atrioventricular block, complete: Secondary | ICD-10-CM | POA: Diagnosis not present

## 2020-06-12 DIAGNOSIS — M170 Bilateral primary osteoarthritis of knee: Secondary | ICD-10-CM | POA: Diagnosis not present

## 2020-06-12 DIAGNOSIS — E1122 Type 2 diabetes mellitus with diabetic chronic kidney disease: Secondary | ICD-10-CM | POA: Diagnosis not present

## 2020-06-12 DIAGNOSIS — M80051D Age-related osteoporosis with current pathological fracture, right femur, subsequent encounter for fracture with routine healing: Secondary | ICD-10-CM | POA: Diagnosis not present

## 2020-06-12 DIAGNOSIS — D631 Anemia in chronic kidney disease: Secondary | ICD-10-CM | POA: Diagnosis not present

## 2020-06-12 DIAGNOSIS — I129 Hypertensive chronic kidney disease with stage 1 through stage 4 chronic kidney disease, or unspecified chronic kidney disease: Secondary | ICD-10-CM | POA: Diagnosis not present

## 2020-06-12 DIAGNOSIS — I4510 Unspecified right bundle-branch block: Secondary | ICD-10-CM | POA: Diagnosis not present

## 2020-06-19 DIAGNOSIS — M80051D Age-related osteoporosis with current pathological fracture, right femur, subsequent encounter for fracture with routine healing: Secondary | ICD-10-CM | POA: Diagnosis not present

## 2020-06-19 DIAGNOSIS — I129 Hypertensive chronic kidney disease with stage 1 through stage 4 chronic kidney disease, or unspecified chronic kidney disease: Secondary | ICD-10-CM | POA: Diagnosis not present

## 2020-06-19 DIAGNOSIS — N183 Chronic kidney disease, stage 3 unspecified: Secondary | ICD-10-CM | POA: Diagnosis not present

## 2020-06-19 DIAGNOSIS — D631 Anemia in chronic kidney disease: Secondary | ICD-10-CM | POA: Diagnosis not present

## 2020-06-19 DIAGNOSIS — I495 Sick sinus syndrome: Secondary | ICD-10-CM | POA: Diagnosis not present

## 2020-06-19 DIAGNOSIS — E1122 Type 2 diabetes mellitus with diabetic chronic kidney disease: Secondary | ICD-10-CM | POA: Diagnosis not present

## 2020-06-19 DIAGNOSIS — I442 Atrioventricular block, complete: Secondary | ICD-10-CM | POA: Diagnosis not present

## 2020-06-19 DIAGNOSIS — M170 Bilateral primary osteoarthritis of knee: Secondary | ICD-10-CM | POA: Diagnosis not present

## 2020-06-19 DIAGNOSIS — I4510 Unspecified right bundle-branch block: Secondary | ICD-10-CM | POA: Diagnosis not present

## 2020-06-20 DIAGNOSIS — I4510 Unspecified right bundle-branch block: Secondary | ICD-10-CM | POA: Diagnosis not present

## 2020-06-20 DIAGNOSIS — N183 Chronic kidney disease, stage 3 unspecified: Secondary | ICD-10-CM | POA: Diagnosis not present

## 2020-06-20 DIAGNOSIS — E1122 Type 2 diabetes mellitus with diabetic chronic kidney disease: Secondary | ICD-10-CM | POA: Diagnosis not present

## 2020-06-20 DIAGNOSIS — M80051D Age-related osteoporosis with current pathological fracture, right femur, subsequent encounter for fracture with routine healing: Secondary | ICD-10-CM | POA: Diagnosis not present

## 2020-06-20 DIAGNOSIS — I495 Sick sinus syndrome: Secondary | ICD-10-CM | POA: Diagnosis not present

## 2020-06-20 DIAGNOSIS — I129 Hypertensive chronic kidney disease with stage 1 through stage 4 chronic kidney disease, or unspecified chronic kidney disease: Secondary | ICD-10-CM | POA: Diagnosis not present

## 2020-06-20 DIAGNOSIS — D631 Anemia in chronic kidney disease: Secondary | ICD-10-CM | POA: Diagnosis not present

## 2020-06-20 DIAGNOSIS — M170 Bilateral primary osteoarthritis of knee: Secondary | ICD-10-CM | POA: Diagnosis not present

## 2020-06-20 DIAGNOSIS — I442 Atrioventricular block, complete: Secondary | ICD-10-CM | POA: Diagnosis not present

## 2020-06-25 DIAGNOSIS — M170 Bilateral primary osteoarthritis of knee: Secondary | ICD-10-CM | POA: Diagnosis not present

## 2020-06-25 DIAGNOSIS — I4510 Unspecified right bundle-branch block: Secondary | ICD-10-CM | POA: Diagnosis not present

## 2020-06-25 DIAGNOSIS — E1122 Type 2 diabetes mellitus with diabetic chronic kidney disease: Secondary | ICD-10-CM | POA: Diagnosis not present

## 2020-06-25 DIAGNOSIS — I495 Sick sinus syndrome: Secondary | ICD-10-CM | POA: Diagnosis not present

## 2020-06-25 DIAGNOSIS — M80051D Age-related osteoporosis with current pathological fracture, right femur, subsequent encounter for fracture with routine healing: Secondary | ICD-10-CM | POA: Diagnosis not present

## 2020-06-25 DIAGNOSIS — I129 Hypertensive chronic kidney disease with stage 1 through stage 4 chronic kidney disease, or unspecified chronic kidney disease: Secondary | ICD-10-CM | POA: Diagnosis not present

## 2020-06-25 DIAGNOSIS — D631 Anemia in chronic kidney disease: Secondary | ICD-10-CM | POA: Diagnosis not present

## 2020-06-25 DIAGNOSIS — I442 Atrioventricular block, complete: Secondary | ICD-10-CM | POA: Diagnosis not present

## 2020-06-25 DIAGNOSIS — N183 Chronic kidney disease, stage 3 unspecified: Secondary | ICD-10-CM | POA: Diagnosis not present

## 2020-06-27 DIAGNOSIS — M80051D Age-related osteoporosis with current pathological fracture, right femur, subsequent encounter for fracture with routine healing: Secondary | ICD-10-CM | POA: Diagnosis not present

## 2020-06-27 DIAGNOSIS — E1122 Type 2 diabetes mellitus with diabetic chronic kidney disease: Secondary | ICD-10-CM | POA: Diagnosis not present

## 2020-06-27 DIAGNOSIS — D631 Anemia in chronic kidney disease: Secondary | ICD-10-CM | POA: Diagnosis not present

## 2020-06-27 DIAGNOSIS — I495 Sick sinus syndrome: Secondary | ICD-10-CM | POA: Diagnosis not present

## 2020-06-27 DIAGNOSIS — I4510 Unspecified right bundle-branch block: Secondary | ICD-10-CM | POA: Diagnosis not present

## 2020-06-27 DIAGNOSIS — N183 Chronic kidney disease, stage 3 unspecified: Secondary | ICD-10-CM | POA: Diagnosis not present

## 2020-06-27 DIAGNOSIS — I129 Hypertensive chronic kidney disease with stage 1 through stage 4 chronic kidney disease, or unspecified chronic kidney disease: Secondary | ICD-10-CM | POA: Diagnosis not present

## 2020-06-27 DIAGNOSIS — I442 Atrioventricular block, complete: Secondary | ICD-10-CM | POA: Diagnosis not present

## 2020-06-27 DIAGNOSIS — M170 Bilateral primary osteoarthritis of knee: Secondary | ICD-10-CM | POA: Diagnosis not present

## 2020-06-30 DIAGNOSIS — N183 Chronic kidney disease, stage 3 unspecified: Secondary | ICD-10-CM | POA: Diagnosis not present

## 2020-06-30 DIAGNOSIS — I129 Hypertensive chronic kidney disease with stage 1 through stage 4 chronic kidney disease, or unspecified chronic kidney disease: Secondary | ICD-10-CM | POA: Diagnosis not present

## 2020-06-30 DIAGNOSIS — E1122 Type 2 diabetes mellitus with diabetic chronic kidney disease: Secondary | ICD-10-CM | POA: Diagnosis not present

## 2020-06-30 DIAGNOSIS — M170 Bilateral primary osteoarthritis of knee: Secondary | ICD-10-CM | POA: Diagnosis not present

## 2020-06-30 DIAGNOSIS — I4510 Unspecified right bundle-branch block: Secondary | ICD-10-CM | POA: Diagnosis not present

## 2020-06-30 DIAGNOSIS — I442 Atrioventricular block, complete: Secondary | ICD-10-CM | POA: Diagnosis not present

## 2020-06-30 DIAGNOSIS — I495 Sick sinus syndrome: Secondary | ICD-10-CM | POA: Diagnosis not present

## 2020-06-30 DIAGNOSIS — M80051D Age-related osteoporosis with current pathological fracture, right femur, subsequent encounter for fracture with routine healing: Secondary | ICD-10-CM | POA: Diagnosis not present

## 2020-06-30 DIAGNOSIS — D631 Anemia in chronic kidney disease: Secondary | ICD-10-CM | POA: Diagnosis not present

## 2020-07-01 DIAGNOSIS — D631 Anemia in chronic kidney disease: Secondary | ICD-10-CM | POA: Diagnosis not present

## 2020-07-01 DIAGNOSIS — I129 Hypertensive chronic kidney disease with stage 1 through stage 4 chronic kidney disease, or unspecified chronic kidney disease: Secondary | ICD-10-CM | POA: Diagnosis not present

## 2020-07-01 DIAGNOSIS — I4510 Unspecified right bundle-branch block: Secondary | ICD-10-CM | POA: Diagnosis not present

## 2020-07-01 DIAGNOSIS — N183 Chronic kidney disease, stage 3 unspecified: Secondary | ICD-10-CM | POA: Diagnosis not present

## 2020-07-01 DIAGNOSIS — M170 Bilateral primary osteoarthritis of knee: Secondary | ICD-10-CM | POA: Diagnosis not present

## 2020-07-01 DIAGNOSIS — I495 Sick sinus syndrome: Secondary | ICD-10-CM | POA: Diagnosis not present

## 2020-07-01 DIAGNOSIS — E1122 Type 2 diabetes mellitus with diabetic chronic kidney disease: Secondary | ICD-10-CM | POA: Diagnosis not present

## 2020-07-01 DIAGNOSIS — M80051D Age-related osteoporosis with current pathological fracture, right femur, subsequent encounter for fracture with routine healing: Secondary | ICD-10-CM | POA: Diagnosis not present

## 2020-07-01 DIAGNOSIS — I442 Atrioventricular block, complete: Secondary | ICD-10-CM | POA: Diagnosis not present

## 2020-07-07 DIAGNOSIS — N183 Chronic kidney disease, stage 3 unspecified: Secondary | ICD-10-CM | POA: Diagnosis not present

## 2020-07-07 DIAGNOSIS — D631 Anemia in chronic kidney disease: Secondary | ICD-10-CM | POA: Diagnosis not present

## 2020-07-07 DIAGNOSIS — M80051D Age-related osteoporosis with current pathological fracture, right femur, subsequent encounter for fracture with routine healing: Secondary | ICD-10-CM | POA: Diagnosis not present

## 2020-07-07 DIAGNOSIS — I4510 Unspecified right bundle-branch block: Secondary | ICD-10-CM | POA: Diagnosis not present

## 2020-07-07 DIAGNOSIS — E1122 Type 2 diabetes mellitus with diabetic chronic kidney disease: Secondary | ICD-10-CM | POA: Diagnosis not present

## 2020-07-07 DIAGNOSIS — I129 Hypertensive chronic kidney disease with stage 1 through stage 4 chronic kidney disease, or unspecified chronic kidney disease: Secondary | ICD-10-CM | POA: Diagnosis not present

## 2020-07-07 DIAGNOSIS — I442 Atrioventricular block, complete: Secondary | ICD-10-CM | POA: Diagnosis not present

## 2020-07-07 DIAGNOSIS — M170 Bilateral primary osteoarthritis of knee: Secondary | ICD-10-CM | POA: Diagnosis not present

## 2020-07-07 DIAGNOSIS — I495 Sick sinus syndrome: Secondary | ICD-10-CM | POA: Diagnosis not present

## 2020-07-08 DIAGNOSIS — M80051D Age-related osteoporosis with current pathological fracture, right femur, subsequent encounter for fracture with routine healing: Secondary | ICD-10-CM | POA: Diagnosis not present

## 2020-07-08 DIAGNOSIS — N183 Chronic kidney disease, stage 3 unspecified: Secondary | ICD-10-CM | POA: Diagnosis not present

## 2020-07-08 DIAGNOSIS — I129 Hypertensive chronic kidney disease with stage 1 through stage 4 chronic kidney disease, or unspecified chronic kidney disease: Secondary | ICD-10-CM | POA: Diagnosis not present

## 2020-07-08 DIAGNOSIS — I4510 Unspecified right bundle-branch block: Secondary | ICD-10-CM | POA: Diagnosis not present

## 2020-07-08 DIAGNOSIS — I495 Sick sinus syndrome: Secondary | ICD-10-CM | POA: Diagnosis not present

## 2020-07-08 DIAGNOSIS — D631 Anemia in chronic kidney disease: Secondary | ICD-10-CM | POA: Diagnosis not present

## 2020-07-08 DIAGNOSIS — I442 Atrioventricular block, complete: Secondary | ICD-10-CM | POA: Diagnosis not present

## 2020-07-08 DIAGNOSIS — M170 Bilateral primary osteoarthritis of knee: Secondary | ICD-10-CM | POA: Diagnosis not present

## 2020-07-08 DIAGNOSIS — E1122 Type 2 diabetes mellitus with diabetic chronic kidney disease: Secondary | ICD-10-CM | POA: Diagnosis not present

## 2020-07-09 ENCOUNTER — Ambulatory Visit (INDEPENDENT_AMBULATORY_CARE_PROVIDER_SITE_OTHER): Payer: Medicare HMO

## 2020-07-09 DIAGNOSIS — I495 Sick sinus syndrome: Secondary | ICD-10-CM | POA: Diagnosis not present

## 2020-07-10 DIAGNOSIS — N183 Chronic kidney disease, stage 3 unspecified: Secondary | ICD-10-CM | POA: Diagnosis not present

## 2020-07-10 DIAGNOSIS — D631 Anemia in chronic kidney disease: Secondary | ICD-10-CM | POA: Diagnosis not present

## 2020-07-10 DIAGNOSIS — I442 Atrioventricular block, complete: Secondary | ICD-10-CM | POA: Diagnosis not present

## 2020-07-10 DIAGNOSIS — I495 Sick sinus syndrome: Secondary | ICD-10-CM | POA: Diagnosis not present

## 2020-07-10 DIAGNOSIS — M80051D Age-related osteoporosis with current pathological fracture, right femur, subsequent encounter for fracture with routine healing: Secondary | ICD-10-CM | POA: Diagnosis not present

## 2020-07-10 DIAGNOSIS — I4510 Unspecified right bundle-branch block: Secondary | ICD-10-CM | POA: Diagnosis not present

## 2020-07-10 DIAGNOSIS — I129 Hypertensive chronic kidney disease with stage 1 through stage 4 chronic kidney disease, or unspecified chronic kidney disease: Secondary | ICD-10-CM | POA: Diagnosis not present

## 2020-07-10 DIAGNOSIS — M170 Bilateral primary osteoarthritis of knee: Secondary | ICD-10-CM | POA: Diagnosis not present

## 2020-07-10 DIAGNOSIS — E1122 Type 2 diabetes mellitus with diabetic chronic kidney disease: Secondary | ICD-10-CM | POA: Diagnosis not present

## 2020-07-11 LAB — CUP PACEART REMOTE DEVICE CHECK
Date Time Interrogation Session: 20220412065033
Implantable Lead Implant Date: 20170714
Implantable Lead Implant Date: 20170714
Implantable Lead Location: 753859
Implantable Lead Location: 753860
Implantable Lead Model: 377
Implantable Lead Model: 377
Implantable Lead Serial Number: 49489173
Implantable Lead Serial Number: 49549002
Implantable Pulse Generator Implant Date: 20170714
Pulse Gen Model: 394969
Pulse Gen Serial Number: 68764743

## 2020-07-14 DIAGNOSIS — D631 Anemia in chronic kidney disease: Secondary | ICD-10-CM | POA: Diagnosis not present

## 2020-07-14 DIAGNOSIS — I129 Hypertensive chronic kidney disease with stage 1 through stage 4 chronic kidney disease, or unspecified chronic kidney disease: Secondary | ICD-10-CM | POA: Diagnosis not present

## 2020-07-14 DIAGNOSIS — I4510 Unspecified right bundle-branch block: Secondary | ICD-10-CM | POA: Diagnosis not present

## 2020-07-14 DIAGNOSIS — N183 Chronic kidney disease, stage 3 unspecified: Secondary | ICD-10-CM | POA: Diagnosis not present

## 2020-07-14 DIAGNOSIS — I442 Atrioventricular block, complete: Secondary | ICD-10-CM | POA: Diagnosis not present

## 2020-07-14 DIAGNOSIS — M170 Bilateral primary osteoarthritis of knee: Secondary | ICD-10-CM | POA: Diagnosis not present

## 2020-07-14 DIAGNOSIS — I495 Sick sinus syndrome: Secondary | ICD-10-CM | POA: Diagnosis not present

## 2020-07-14 DIAGNOSIS — M80051D Age-related osteoporosis with current pathological fracture, right femur, subsequent encounter for fracture with routine healing: Secondary | ICD-10-CM | POA: Diagnosis not present

## 2020-07-14 DIAGNOSIS — E1122 Type 2 diabetes mellitus with diabetic chronic kidney disease: Secondary | ICD-10-CM | POA: Diagnosis not present

## 2020-07-16 DIAGNOSIS — D631 Anemia in chronic kidney disease: Secondary | ICD-10-CM | POA: Diagnosis not present

## 2020-07-16 DIAGNOSIS — M170 Bilateral primary osteoarthritis of knee: Secondary | ICD-10-CM | POA: Diagnosis not present

## 2020-07-16 DIAGNOSIS — I129 Hypertensive chronic kidney disease with stage 1 through stage 4 chronic kidney disease, or unspecified chronic kidney disease: Secondary | ICD-10-CM | POA: Diagnosis not present

## 2020-07-16 DIAGNOSIS — M80051D Age-related osteoporosis with current pathological fracture, right femur, subsequent encounter for fracture with routine healing: Secondary | ICD-10-CM | POA: Diagnosis not present

## 2020-07-16 DIAGNOSIS — I4510 Unspecified right bundle-branch block: Secondary | ICD-10-CM | POA: Diagnosis not present

## 2020-07-16 DIAGNOSIS — E1122 Type 2 diabetes mellitus with diabetic chronic kidney disease: Secondary | ICD-10-CM | POA: Diagnosis not present

## 2020-07-16 DIAGNOSIS — N183 Chronic kidney disease, stage 3 unspecified: Secondary | ICD-10-CM | POA: Diagnosis not present

## 2020-07-16 DIAGNOSIS — I495 Sick sinus syndrome: Secondary | ICD-10-CM | POA: Diagnosis not present

## 2020-07-16 DIAGNOSIS — I442 Atrioventricular block, complete: Secondary | ICD-10-CM | POA: Diagnosis not present

## 2020-07-23 DIAGNOSIS — I442 Atrioventricular block, complete: Secondary | ICD-10-CM | POA: Diagnosis not present

## 2020-07-23 DIAGNOSIS — I495 Sick sinus syndrome: Secondary | ICD-10-CM | POA: Diagnosis not present

## 2020-07-23 DIAGNOSIS — D631 Anemia in chronic kidney disease: Secondary | ICD-10-CM | POA: Diagnosis not present

## 2020-07-23 DIAGNOSIS — M80051D Age-related osteoporosis with current pathological fracture, right femur, subsequent encounter for fracture with routine healing: Secondary | ICD-10-CM | POA: Diagnosis not present

## 2020-07-23 DIAGNOSIS — I129 Hypertensive chronic kidney disease with stage 1 through stage 4 chronic kidney disease, or unspecified chronic kidney disease: Secondary | ICD-10-CM | POA: Diagnosis not present

## 2020-07-23 DIAGNOSIS — M170 Bilateral primary osteoarthritis of knee: Secondary | ICD-10-CM | POA: Diagnosis not present

## 2020-07-23 DIAGNOSIS — N183 Chronic kidney disease, stage 3 unspecified: Secondary | ICD-10-CM | POA: Diagnosis not present

## 2020-07-23 DIAGNOSIS — I4510 Unspecified right bundle-branch block: Secondary | ICD-10-CM | POA: Diagnosis not present

## 2020-07-23 DIAGNOSIS — E1122 Type 2 diabetes mellitus with diabetic chronic kidney disease: Secondary | ICD-10-CM | POA: Diagnosis not present

## 2020-07-24 NOTE — Progress Notes (Signed)
Remote pacemaker transmission.   

## 2020-07-28 DIAGNOSIS — N183 Chronic kidney disease, stage 3 unspecified: Secondary | ICD-10-CM | POA: Diagnosis not present

## 2020-07-28 DIAGNOSIS — D631 Anemia in chronic kidney disease: Secondary | ICD-10-CM | POA: Diagnosis not present

## 2020-07-28 DIAGNOSIS — I4510 Unspecified right bundle-branch block: Secondary | ICD-10-CM | POA: Diagnosis not present

## 2020-07-28 DIAGNOSIS — M170 Bilateral primary osteoarthritis of knee: Secondary | ICD-10-CM | POA: Diagnosis not present

## 2020-07-28 DIAGNOSIS — I495 Sick sinus syndrome: Secondary | ICD-10-CM | POA: Diagnosis not present

## 2020-07-28 DIAGNOSIS — I442 Atrioventricular block, complete: Secondary | ICD-10-CM | POA: Diagnosis not present

## 2020-07-28 DIAGNOSIS — E1122 Type 2 diabetes mellitus with diabetic chronic kidney disease: Secondary | ICD-10-CM | POA: Diagnosis not present

## 2020-07-28 DIAGNOSIS — I129 Hypertensive chronic kidney disease with stage 1 through stage 4 chronic kidney disease, or unspecified chronic kidney disease: Secondary | ICD-10-CM | POA: Diagnosis not present

## 2020-07-28 DIAGNOSIS — M80051D Age-related osteoporosis with current pathological fracture, right femur, subsequent encounter for fracture with routine healing: Secondary | ICD-10-CM | POA: Diagnosis not present

## 2020-08-04 DIAGNOSIS — Z7984 Long term (current) use of oral hypoglycemic drugs: Secondary | ICD-10-CM | POA: Diagnosis not present

## 2020-08-04 DIAGNOSIS — N1832 Chronic kidney disease, stage 3b: Secondary | ICD-10-CM | POA: Diagnosis not present

## 2020-08-04 DIAGNOSIS — F324 Major depressive disorder, single episode, in partial remission: Secondary | ICD-10-CM | POA: Diagnosis not present

## 2020-08-04 DIAGNOSIS — E1122 Type 2 diabetes mellitus with diabetic chronic kidney disease: Secondary | ICD-10-CM | POA: Diagnosis not present

## 2020-08-04 DIAGNOSIS — G894 Chronic pain syndrome: Secondary | ICD-10-CM | POA: Diagnosis not present

## 2020-08-04 DIAGNOSIS — I10 Essential (primary) hypertension: Secondary | ICD-10-CM | POA: Diagnosis not present

## 2020-08-04 DIAGNOSIS — R413 Other amnesia: Secondary | ICD-10-CM | POA: Diagnosis not present

## 2020-08-04 DIAGNOSIS — E113299 Type 2 diabetes mellitus with mild nonproliferative diabetic retinopathy without macular edema, unspecified eye: Secondary | ICD-10-CM | POA: Diagnosis not present

## 2020-08-04 DIAGNOSIS — E7800 Pure hypercholesterolemia, unspecified: Secondary | ICD-10-CM | POA: Diagnosis not present

## 2020-08-05 DIAGNOSIS — I442 Atrioventricular block, complete: Secondary | ICD-10-CM | POA: Diagnosis not present

## 2020-08-05 DIAGNOSIS — E1122 Type 2 diabetes mellitus with diabetic chronic kidney disease: Secondary | ICD-10-CM | POA: Diagnosis not present

## 2020-08-05 DIAGNOSIS — D631 Anemia in chronic kidney disease: Secondary | ICD-10-CM | POA: Diagnosis not present

## 2020-08-05 DIAGNOSIS — M80051D Age-related osteoporosis with current pathological fracture, right femur, subsequent encounter for fracture with routine healing: Secondary | ICD-10-CM | POA: Diagnosis not present

## 2020-08-05 DIAGNOSIS — I495 Sick sinus syndrome: Secondary | ICD-10-CM | POA: Diagnosis not present

## 2020-08-05 DIAGNOSIS — I4510 Unspecified right bundle-branch block: Secondary | ICD-10-CM | POA: Diagnosis not present

## 2020-08-05 DIAGNOSIS — N183 Chronic kidney disease, stage 3 unspecified: Secondary | ICD-10-CM | POA: Diagnosis not present

## 2020-08-05 DIAGNOSIS — M170 Bilateral primary osteoarthritis of knee: Secondary | ICD-10-CM | POA: Diagnosis not present

## 2020-08-05 DIAGNOSIS — I129 Hypertensive chronic kidney disease with stage 1 through stage 4 chronic kidney disease, or unspecified chronic kidney disease: Secondary | ICD-10-CM | POA: Diagnosis not present

## 2020-08-18 ENCOUNTER — Encounter: Payer: Self-pay | Admitting: Podiatry

## 2020-09-05 ENCOUNTER — Other Ambulatory Visit: Payer: Self-pay

## 2020-09-05 ENCOUNTER — Ambulatory Visit: Payer: Medicare HMO | Admitting: Podiatry

## 2020-09-05 DIAGNOSIS — E1142 Type 2 diabetes mellitus with diabetic polyneuropathy: Secondary | ICD-10-CM | POA: Diagnosis not present

## 2020-09-05 DIAGNOSIS — G4700 Insomnia, unspecified: Secondary | ICD-10-CM | POA: Insufficient documentation

## 2020-09-05 DIAGNOSIS — M179 Osteoarthritis of knee, unspecified: Secondary | ICD-10-CM | POA: Insufficient documentation

## 2020-09-05 DIAGNOSIS — N3281 Overactive bladder: Secondary | ICD-10-CM | POA: Insufficient documentation

## 2020-09-05 DIAGNOSIS — I69391 Dysphagia following cerebral infarction: Secondary | ICD-10-CM | POA: Insufficient documentation

## 2020-09-05 DIAGNOSIS — E11319 Type 2 diabetes mellitus with unspecified diabetic retinopathy without macular edema: Secondary | ICD-10-CM | POA: Insufficient documentation

## 2020-09-05 DIAGNOSIS — E1169 Type 2 diabetes mellitus with other specified complication: Secondary | ICD-10-CM

## 2020-09-05 DIAGNOSIS — I442 Atrioventricular block, complete: Secondary | ICD-10-CM | POA: Insufficient documentation

## 2020-09-05 DIAGNOSIS — M109 Gout, unspecified: Secondary | ICD-10-CM | POA: Insufficient documentation

## 2020-09-05 DIAGNOSIS — E440 Moderate protein-calorie malnutrition: Secondary | ICD-10-CM | POA: Insufficient documentation

## 2020-09-05 DIAGNOSIS — J309 Allergic rhinitis, unspecified: Secondary | ICD-10-CM | POA: Insufficient documentation

## 2020-09-05 DIAGNOSIS — M19041 Primary osteoarthritis, right hand: Secondary | ICD-10-CM | POA: Insufficient documentation

## 2020-09-05 DIAGNOSIS — E1121 Type 2 diabetes mellitus with diabetic nephropathy: Secondary | ICD-10-CM | POA: Insufficient documentation

## 2020-09-05 DIAGNOSIS — M19049 Primary osteoarthritis, unspecified hand: Secondary | ICD-10-CM | POA: Insufficient documentation

## 2020-09-05 DIAGNOSIS — E119 Type 2 diabetes mellitus without complications: Secondary | ICD-10-CM

## 2020-09-05 DIAGNOSIS — E559 Vitamin D deficiency, unspecified: Secondary | ICD-10-CM | POA: Insufficient documentation

## 2020-09-05 DIAGNOSIS — G894 Chronic pain syndrome: Secondary | ICD-10-CM | POA: Insufficient documentation

## 2020-09-05 DIAGNOSIS — I700 Atherosclerosis of aorta: Secondary | ICD-10-CM | POA: Insufficient documentation

## 2020-09-05 DIAGNOSIS — B351 Tinea unguium: Secondary | ICD-10-CM | POA: Diagnosis not present

## 2020-09-05 DIAGNOSIS — K5903 Drug induced constipation: Secondary | ICD-10-CM | POA: Insufficient documentation

## 2020-09-05 DIAGNOSIS — E7800 Pure hypercholesterolemia, unspecified: Secondary | ICD-10-CM | POA: Insufficient documentation

## 2020-09-05 DIAGNOSIS — R413 Other amnesia: Secondary | ICD-10-CM | POA: Insufficient documentation

## 2020-09-05 DIAGNOSIS — I495 Sick sinus syndrome: Secondary | ICD-10-CM | POA: Insufficient documentation

## 2020-09-05 DIAGNOSIS — N189 Chronic kidney disease, unspecified: Secondary | ICD-10-CM | POA: Insufficient documentation

## 2020-09-05 DIAGNOSIS — F324 Major depressive disorder, single episode, in partial remission: Secondary | ICD-10-CM | POA: Insufficient documentation

## 2020-09-05 DIAGNOSIS — F0390 Unspecified dementia without behavioral disturbance: Secondary | ICD-10-CM | POA: Insufficient documentation

## 2020-09-05 DIAGNOSIS — D72829 Elevated white blood cell count, unspecified: Secondary | ICD-10-CM | POA: Insufficient documentation

## 2020-09-26 NOTE — Progress Notes (Signed)
  Subjective:  Patient ID: Noah Adkins, male    DOB: 1926-11-17,  MRN: [id number]  Chief Complaint  Patient presents with   Nail Problem    Nail trim 1-5 bilateral    87 y.o. male presents with the above complaint. History confirmed with patient.   Objective:  Physical Exam: warm, good capillary refill, nail exam onychomycosis of the toenails, no trophic changes or ulcerative lesions. DP pulses palpable, PT pulses palpable, and protective sensation absent Left Foot: normal exam, no swelling, tenderness, instability; ligaments intact, full range of motion of all ankle/foot joints  Right Foot: normal exam, no swelling, tenderness, instability; ligaments intact, full range of motion of all ankle/foot joints   No images are attached to the encounter.  Assessment:   1. Onychomycosis of multiple toenails with type 2 diabetes mellitus and peripheral neuropathy (HCC)      Plan:  Patient was evaluated and treated and all questions answered.  Onychomycosis -Educated on DM footcare -Patient is diabetic with a qualifying condition for at risk foot care.  Procedure: Nail Debridement Type of Debridement: manual, sharp debridement. Instrumentation: Nail nipper, rotary burr. Number of Nails: 10  No follow-ups on file.

## 2020-10-08 ENCOUNTER — Ambulatory Visit (INDEPENDENT_AMBULATORY_CARE_PROVIDER_SITE_OTHER): Payer: Medicare HMO

## 2020-10-08 DIAGNOSIS — I495 Sick sinus syndrome: Secondary | ICD-10-CM

## 2020-10-08 LAB — CUP PACEART REMOTE DEVICE CHECK
Date Time Interrogation Session: 20220713073340
Implantable Lead Implant Date: 20170714
Implantable Lead Implant Date: 20170714
Implantable Lead Location: 753859
Implantable Lead Location: 753860
Implantable Lead Model: 377
Implantable Lead Model: 377
Implantable Lead Serial Number: 49489173
Implantable Lead Serial Number: 49549002
Implantable Pulse Generator Implant Date: 20170714
Pulse Gen Model: 394969
Pulse Gen Serial Number: 68764743

## 2020-10-31 NOTE — Progress Notes (Signed)
Remote pacemaker transmission.   

## 2020-12-05 ENCOUNTER — Telehealth: Payer: Self-pay

## 2020-12-05 NOTE — Telephone Encounter (Signed)
LMOVM for patient to check home monitor.

## 2020-12-05 NOTE — Telephone Encounter (Signed)
-----   Message from Oscar La, RN sent at 12/05/2020  7:44 AM EDT ----- Regarding: bio-no transmit 21 days Can you please call this gentleman to see if his monitor is working. Thanks! Portia

## 2020-12-09 DIAGNOSIS — I10 Essential (primary) hypertension: Secondary | ICD-10-CM | POA: Diagnosis not present

## 2020-12-09 DIAGNOSIS — I129 Hypertensive chronic kidney disease with stage 1 through stage 4 chronic kidney disease, or unspecified chronic kidney disease: Secondary | ICD-10-CM | POA: Diagnosis not present

## 2020-12-09 DIAGNOSIS — G8929 Other chronic pain: Secondary | ICD-10-CM | POA: Diagnosis not present

## 2020-12-09 DIAGNOSIS — F324 Major depressive disorder, single episode, in partial remission: Secondary | ICD-10-CM | POA: Diagnosis not present

## 2020-12-09 DIAGNOSIS — G4700 Insomnia, unspecified: Secondary | ICD-10-CM | POA: Diagnosis not present

## 2020-12-09 DIAGNOSIS — E7800 Pure hypercholesterolemia, unspecified: Secondary | ICD-10-CM | POA: Diagnosis not present

## 2020-12-09 DIAGNOSIS — G459 Transient cerebral ischemic attack, unspecified: Secondary | ICD-10-CM | POA: Diagnosis not present

## 2020-12-09 DIAGNOSIS — N1832 Chronic kidney disease, stage 3b: Secondary | ICD-10-CM | POA: Diagnosis not present

## 2020-12-09 DIAGNOSIS — E1122 Type 2 diabetes mellitus with diabetic chronic kidney disease: Secondary | ICD-10-CM | POA: Diagnosis not present

## 2020-12-10 NOTE — Telephone Encounter (Signed)
LMOVM for patient to return device clinic call. 

## 2020-12-12 ENCOUNTER — Other Ambulatory Visit: Payer: Self-pay

## 2020-12-12 ENCOUNTER — Ambulatory Visit (INDEPENDENT_AMBULATORY_CARE_PROVIDER_SITE_OTHER): Payer: Medicare HMO | Admitting: Podiatry

## 2020-12-12 DIAGNOSIS — B351 Tinea unguium: Secondary | ICD-10-CM | POA: Diagnosis not present

## 2020-12-12 DIAGNOSIS — E1142 Type 2 diabetes mellitus with diabetic polyneuropathy: Secondary | ICD-10-CM | POA: Diagnosis not present

## 2020-12-12 DIAGNOSIS — E1169 Type 2 diabetes mellitus with other specified complication: Secondary | ICD-10-CM

## 2020-12-12 NOTE — Telephone Encounter (Signed)
LMOVM on wife voicemail to give the device clinic a call back.

## 2020-12-12 NOTE — Progress Notes (Signed)
  Subjective:  Patient ID: Noah Adkins, male    DOB: 02/25/1927,  MRN: [id number]  Chief Complaint  Patient presents with   Nail Problem    Nail trim 1-5 bilateral   87 y.o. male presents with the above complaint. History confirmed with patient.   Objective:  Physical Exam: warm, good capillary refill, nail exam onychomycosis of the toenails, no trophic changes or ulcerative lesions. DP pulses palpable, PT pulses palpable, and protective sensation absent Left Foot: normal exam, no swelling, tenderness, instability; ligaments intact, full range of motion of all ankle/foot joints  Right Foot: normal exam, no swelling, tenderness, instability; ligaments intact, full range of motion of all ankle/foot joints   No images are attached to the encounter.  Assessment:   1. Onychomycosis of multiple toenails with type 2 diabetes mellitus and peripheral neuropathy (HCC)      Plan:  Patient was evaluated and treated and all questions answered.  Onychomycosis, DM, DPN -Nails debrided x10  Procedure: Nail Debridement Type of Debridement: manual, sharp debridement. Instrumentation: Nail nipper, rotary burr. Number of Nails: 10   No follow-ups on file.

## 2020-12-15 NOTE — Telephone Encounter (Signed)
LMOVM for patient to give us a call back. 

## 2020-12-17 NOTE — Telephone Encounter (Signed)
Certified letter sent 

## 2020-12-25 DIAGNOSIS — R059 Cough, unspecified: Secondary | ICD-10-CM | POA: Diagnosis not present

## 2020-12-25 DIAGNOSIS — N390 Urinary tract infection, site not specified: Secondary | ICD-10-CM | POA: Diagnosis not present

## 2020-12-25 DIAGNOSIS — R300 Dysuria: Secondary | ICD-10-CM | POA: Diagnosis not present

## 2021-01-02 ENCOUNTER — Telehealth: Payer: Self-pay

## 2021-01-02 NOTE — Telephone Encounter (Signed)
Pt wife left a message returning our phone call. I called the patient wife back and LMOVM for them to return our phone call.

## 2021-01-05 NOTE — Telephone Encounter (Signed)
Pt monitor is now up to date. Last transmission received 01/04/2021

## 2021-01-07 ENCOUNTER — Ambulatory Visit (INDEPENDENT_AMBULATORY_CARE_PROVIDER_SITE_OTHER): Payer: Medicare HMO

## 2021-01-07 DIAGNOSIS — I495 Sick sinus syndrome: Secondary | ICD-10-CM

## 2021-01-08 LAB — CUP PACEART REMOTE DEVICE CHECK
Date Time Interrogation Session: 20221012083023
Implantable Lead Implant Date: 20170714
Implantable Lead Implant Date: 20170714
Implantable Lead Location: 753859
Implantable Lead Location: 753860
Implantable Lead Model: 377
Implantable Lead Model: 377
Implantable Lead Serial Number: 49489173
Implantable Lead Serial Number: 49549002
Implantable Pulse Generator Implant Date: 20170714
Pulse Gen Model: 394969
Pulse Gen Serial Number: 68764743

## 2021-01-12 ENCOUNTER — Telehealth: Payer: Self-pay

## 2021-01-12 NOTE — Telephone Encounter (Signed)
LMOVM for patient wife to give [place] a call back.

## 2021-01-16 NOTE — Progress Notes (Signed)
Remote pacemaker transmission.   

## 2021-01-29 ENCOUNTER — Encounter: Payer: Self-pay | Admitting: Internal Medicine

## 2021-03-09 DIAGNOSIS — E7800 Pure hypercholesterolemia, unspecified: Secondary | ICD-10-CM | POA: Diagnosis not present

## 2021-03-09 DIAGNOSIS — E1122 Type 2 diabetes mellitus with diabetic chronic kidney disease: Secondary | ICD-10-CM | POA: Diagnosis not present

## 2021-03-09 DIAGNOSIS — F0390 Unspecified dementia without behavioral disturbance: Secondary | ICD-10-CM | POA: Diagnosis not present

## 2021-03-09 DIAGNOSIS — F324 Major depressive disorder, single episode, in partial remission: Secondary | ICD-10-CM | POA: Diagnosis not present

## 2021-03-09 DIAGNOSIS — N1832 Chronic kidney disease, stage 3b: Secondary | ICD-10-CM | POA: Diagnosis not present

## 2021-03-09 DIAGNOSIS — G4700 Insomnia, unspecified: Secondary | ICD-10-CM | POA: Diagnosis not present

## 2021-03-09 DIAGNOSIS — I10 Essential (primary) hypertension: Secondary | ICD-10-CM | POA: Diagnosis not present

## 2021-03-09 DIAGNOSIS — E113291 Type 2 diabetes mellitus with mild nonproliferative diabetic retinopathy without macular edema, right eye: Secondary | ICD-10-CM | POA: Diagnosis not present

## 2021-03-09 DIAGNOSIS — Z0000 Encounter for general adult medical examination without abnormal findings: Secondary | ICD-10-CM | POA: Diagnosis not present

## 2021-03-13 ENCOUNTER — Ambulatory Visit: Payer: Medicare HMO | Admitting: Podiatry

## 2021-03-17 ENCOUNTER — Ambulatory Visit (INDEPENDENT_AMBULATORY_CARE_PROVIDER_SITE_OTHER): Payer: Medicare HMO | Admitting: Podiatry

## 2021-03-17 DIAGNOSIS — Z91199 Patient's noncompliance with other medical treatment and regimen due to unspecified reason: Secondary | ICD-10-CM

## 2021-03-17 NOTE — Progress Notes (Signed)
   Complete physical exam  Patient: Noah Adkins   DOB: 01/16/1997   87 y.o. Male  MRN: [id number]  Subjective:    No chief complaint on file.   Noah Adkins is a 87 y.o. male who presents today for a complete physical exam. She reports consuming a {diet types:17450} diet. {types:19826} She generally feels {DESC; WELL/FAIRLY WELL/POORLY:18703}. She reports sleeping {DESC; WELL/FAIRLY WELL/POORLY:18703}. She {does/does not:200015} have additional problems to discuss today.    Most recent fall risk assessment:    09/23/2021   10:42 AM  Fall Risk   Falls in the past year? 0  Number falls in past yr: 0  Injury with Fall? 0  Risk for fall due to : No Fall Risks  Follow up Falls evaluation completed     Most recent depression screenings:    09/23/2021   10:42 AM 08/14/2020   10:46 AM  PHQ 2/9 Scores  PHQ - 2 Score 0 0  PHQ- 9 Score 5     {VISON DENTAL STD PSA (Optional):27386}  {History (Optional):23778}  Patient Care Team: Jessup, Joy, NP as PCP - General (Nurse Practitioner)   Outpatient Medications Prior to Visit  Medication Sig   fluticasone (FLONASE) 50 MCG/ACT nasal spray Place 2 sprays into both nostrils in the morning and at bedtime. After 7 days, reduce to once daily.   norgestimate-ethinyl estradiol (SPRINTEC 28) 0.25-35 MG-MCG tablet Take 1 tablet by mouth daily.   Nystatin POWD Apply liberally to affected area 2 times per day   spironolactone (ALDACTONE) 100 MG tablet Take 1 tablet (100 mg total) by mouth daily.   No facility-administered medications prior to visit.    ROS        Objective:     There were no vitals taken for this visit. {Vitals History (Optional):23777}  Physical Exam   No results found for any visits on 10/29/21. {Show previous labs (optional):23779}    Assessment & Plan:    Routine Health Maintenance and Physical Exam  Immunization History  Administered Date(s) Administered   DTaP 04/01/1999, 05/28/1999,  08/06/1999, 04/21/2000, 11/05/2003   Hepatitis A 09/01/2007, 09/06/2008   Hepatitis B 01/17/1999, 02/24/1999, 08/06/1999   HiB (PRP-OMP) 04/01/1999, 05/28/1999, 08/06/1999, 04/21/2000   IPV 04/01/1999, 05/28/1999, 01/25/2000, 11/05/2003   Influenza,inj,Quad PF,6+ Mos 12/07/2013   Influenza-Unspecified 03/08/2012   MMR 01/24/2001, 11/05/2003   Meningococcal Polysaccharide 09/06/2011   Pneumococcal Conjugate-13 04/21/2000   Pneumococcal-Unspecified 08/06/1999, 10/20/1999   Tdap 09/06/2011   Varicella 01/25/2000, 09/01/2007    Health Maintenance  Topic Date Due   HIV Screening  Never done   Hepatitis C Screening  Never done   INFLUENZA VACCINE  10/27/2021   PAP-Cervical Cytology Screening  10/29/2021 (Originally 01/16/2020)   PAP SMEAR-Modifier  10/29/2021 (Originally 01/16/2020)   TETANUS/TDAP  10/29/2021 (Originally 09/05/2021)   HPV VACCINES  Discontinued   COVID-19 Vaccine  Discontinued    Discussed health benefits of physical activity, and encouraged her to engage in regular exercise appropriate for her age and condition.  Problem List Items Addressed This Visit   None Visit Diagnoses     Annual physical exam    -  Primary   Cervical cancer screening       Need for Tdap vaccination          No follow-ups on file.     Joy Jessup, NP   

## 2021-04-01 DIAGNOSIS — R059 Cough, unspecified: Secondary | ICD-10-CM | POA: Diagnosis not present

## 2021-04-01 DIAGNOSIS — J309 Allergic rhinitis, unspecified: Secondary | ICD-10-CM | POA: Diagnosis not present

## 2021-04-01 DIAGNOSIS — E1122 Type 2 diabetes mellitus with diabetic chronic kidney disease: Secondary | ICD-10-CM | POA: Diagnosis not present

## 2021-04-01 DIAGNOSIS — E7800 Pure hypercholesterolemia, unspecified: Secondary | ICD-10-CM | POA: Diagnosis not present

## 2021-04-01 DIAGNOSIS — Z7984 Long term (current) use of oral hypoglycemic drugs: Secondary | ICD-10-CM | POA: Diagnosis not present

## 2021-04-01 DIAGNOSIS — R197 Diarrhea, unspecified: Secondary | ICD-10-CM | POA: Diagnosis not present

## 2021-04-01 DIAGNOSIS — F0390 Unspecified dementia without behavioral disturbance: Secondary | ICD-10-CM | POA: Diagnosis not present

## 2021-04-01 DIAGNOSIS — M109 Gout, unspecified: Secondary | ICD-10-CM | POA: Diagnosis not present

## 2021-04-01 DIAGNOSIS — I10 Essential (primary) hypertension: Secondary | ICD-10-CM | POA: Diagnosis not present

## 2021-04-01 DIAGNOSIS — N390 Urinary tract infection, site not specified: Secondary | ICD-10-CM | POA: Diagnosis not present

## 2021-04-07 ENCOUNTER — Other Ambulatory Visit: Payer: Self-pay

## 2021-04-07 ENCOUNTER — Ambulatory Visit (INDEPENDENT_AMBULATORY_CARE_PROVIDER_SITE_OTHER): Payer: Medicare HMO | Admitting: Podiatry

## 2021-04-07 DIAGNOSIS — B351 Tinea unguium: Secondary | ICD-10-CM

## 2021-04-07 DIAGNOSIS — E1142 Type 2 diabetes mellitus with diabetic polyneuropathy: Secondary | ICD-10-CM | POA: Diagnosis not present

## 2021-04-07 DIAGNOSIS — E1169 Type 2 diabetes mellitus with other specified complication: Secondary | ICD-10-CM

## 2021-04-07 LAB — CUP PACEART REMOTE DEVICE CHECK
Date Time Interrogation Session: 20230110093830
Implantable Lead Implant Date: 20170714
Implantable Lead Implant Date: 20170714
Implantable Lead Location: 753859
Implantable Lead Location: 753860
Implantable Lead Model: 377
Implantable Lead Model: 377
Implantable Lead Serial Number: 49489173
Implantable Lead Serial Number: 49549002
Implantable Pulse Generator Implant Date: 20170714
Pulse Gen Model: 394969
Pulse Gen Serial Number: 68764743

## 2021-04-07 NOTE — Progress Notes (Signed)
°  Subjective:  Patient ID: Noah Adkins, male    DOB: 1926-10-18,  MRN: [id number]  No chief complaint on file.  88 y.o. male presents with the above complaint. History confirmed with patient. Reports pain in the right great toe. Unaware of last AM BS or A1c.  Objective:  Physical Exam: warm, good capillary refill, nail exam onychomycosis of the toenails, no trophic changes or ulcerative lesions. DP pulses palpable, PT pulses palpable, and protective sensation absent Left Foot: normal exam, no swelling, tenderness, instability; ligaments intact, full range of motion of all ankle/foot joints  Right Foot: normal exam, no swelling, tenderness, instability; ligaments intact, full range of motion of all ankle/foot joints   No images are attached to the encounter.  Assessment:   1. Onychomycosis of multiple toenails with type 2 diabetes mellitus and peripheral neuropathy ([place])    Plan:  Patient was evaluated and treated and all questions answered.  Onychomycosis, DM, DPN -Nails debrided x10  Procedure: Nail Debridement Type of Debridement: manual, sharp debridement. Instrumentation: Nail nipper, rotary burr. Number of Nails: 10     No follow-ups on file.

## 2021-04-08 ENCOUNTER — Ambulatory Visit (INDEPENDENT_AMBULATORY_CARE_PROVIDER_SITE_OTHER): Payer: Medicare HMO

## 2021-04-08 DIAGNOSIS — R55 Syncope and collapse: Secondary | ICD-10-CM

## 2021-04-20 NOTE — Progress Notes (Signed)
Remote pacemaker transmission.   

## 2021-05-19 DIAGNOSIS — R5381 Other malaise: Secondary | ICD-10-CM | POA: Diagnosis not present

## 2021-05-19 DIAGNOSIS — M170 Bilateral primary osteoarthritis of knee: Secondary | ICD-10-CM | POA: Diagnosis not present

## 2021-06-26 DIAGNOSIS — N390 Urinary tract infection, site not specified: Secondary | ICD-10-CM | POA: Diagnosis not present

## 2021-06-26 DIAGNOSIS — N12 Tubulo-interstitial nephritis, not specified as acute or chronic: Secondary | ICD-10-CM | POA: Diagnosis not present

## 2021-06-26 DIAGNOSIS — R058 Other specified cough: Secondary | ICD-10-CM | POA: Diagnosis not present

## 2021-07-07 ENCOUNTER — Ambulatory Visit: Payer: Medicare HMO | Admitting: Podiatry

## 2021-07-08 ENCOUNTER — Ambulatory Visit (INDEPENDENT_AMBULATORY_CARE_PROVIDER_SITE_OTHER): Payer: Medicare HMO

## 2021-07-08 DIAGNOSIS — I495 Sick sinus syndrome: Secondary | ICD-10-CM

## 2021-07-08 LAB — CUP PACEART REMOTE DEVICE CHECK
Battery Remaining Percentage: 55 %
Brady Statistic RA Percent Paced: 83 %
Brady Statistic RV Percent Paced: 99 %
Date Time Interrogation Session: 20230411081211
Implantable Lead Implant Date: 20170714
Implantable Lead Implant Date: 20170714
Implantable Lead Location: 753859
Implantable Lead Location: 753860
Implantable Lead Model: 377
Implantable Lead Model: 377
Implantable Lead Serial Number: 49489173
Implantable Lead Serial Number: 49549002
Implantable Pulse Generator Implant Date: 20170714
Lead Channel Impedance Value: 527 Ohm
Lead Channel Impedance Value: 527 Ohm
Lead Channel Impedance Value: 527 Ohm
Lead Channel Pacing Threshold Amplitude: 0.6 V
Lead Channel Pacing Threshold Amplitude: 0.9 V
Lead Channel Pacing Threshold Pulse Width: 0.4 ms
Lead Channel Pacing Threshold Pulse Width: 0.4 ms
Lead Channel Sensing Intrinsic Amplitude: 3.1 mV
Lead Channel Sensing Intrinsic Amplitude: 5.9 mV
Lead Channel Setting Pacing Amplitude: 2 V
Lead Channel Setting Pacing Amplitude: 2.4 V
Lead Channel Setting Pacing Pulse Width: 0.4 ms
Pulse Gen Model: 394969
Pulse Gen Serial Number: 68764743

## 2021-07-21 ENCOUNTER — Ambulatory Visit: Payer: Medicare HMO | Admitting: Podiatry

## 2021-07-24 NOTE — Progress Notes (Signed)
Remote pacemaker transmission.   

## 2021-07-29 ENCOUNTER — Emergency Department (HOSPITAL_COMMUNITY): Payer: Medicare HMO

## 2021-07-29 ENCOUNTER — Other Ambulatory Visit: Payer: Self-pay

## 2021-07-29 ENCOUNTER — Inpatient Hospital Stay (HOSPITAL_COMMUNITY)
Admission: EM | Admit: 2021-07-29 | Discharge: 2021-08-03 | DRG: 682 | Disposition: A | Discharge status: Hospice | Payer: Medicare HMO | Attending: Internal Medicine | Admitting: Internal Medicine

## 2021-07-29 ENCOUNTER — Encounter (HOSPITAL_COMMUNITY): Payer: Self-pay

## 2021-07-29 DIAGNOSIS — R6520 Severe sepsis without septic shock: Secondary | ICD-10-CM | POA: Diagnosis not present

## 2021-07-29 DIAGNOSIS — N179 Acute kidney failure, unspecified: Principal | ICD-10-CM | POA: Diagnosis present

## 2021-07-29 DIAGNOSIS — Z950 Presence of cardiac pacemaker: Secondary | ICD-10-CM | POA: Diagnosis not present

## 2021-07-29 DIAGNOSIS — I10 Essential (primary) hypertension: Secondary | ICD-10-CM | POA: Diagnosis present

## 2021-07-29 DIAGNOSIS — Z66 Do not resuscitate: Secondary | ICD-10-CM | POA: Diagnosis present

## 2021-07-29 DIAGNOSIS — E872 Acidosis, unspecified: Secondary | ICD-10-CM | POA: Diagnosis not present

## 2021-07-29 DIAGNOSIS — L89151 Pressure ulcer of sacral region, stage 1: Secondary | ICD-10-CM | POA: Diagnosis present

## 2021-07-29 DIAGNOSIS — R410 Disorientation, unspecified: Secondary | ICD-10-CM | POA: Diagnosis not present

## 2021-07-29 DIAGNOSIS — K5730 Diverticulosis of large intestine without perforation or abscess without bleeding: Secondary | ICD-10-CM | POA: Diagnosis not present

## 2021-07-29 DIAGNOSIS — F0390 Unspecified dementia without behavioral disturbance: Secondary | ICD-10-CM | POA: Diagnosis not present

## 2021-07-29 DIAGNOSIS — Z515 Encounter for palliative care: Secondary | ICD-10-CM

## 2021-07-29 DIAGNOSIS — Z6825 Body mass index (BMI) 25.0-25.9, adult: Secondary | ICD-10-CM | POA: Diagnosis not present

## 2021-07-29 DIAGNOSIS — R7881 Bacteremia: Secondary | ICD-10-CM | POA: Diagnosis present

## 2021-07-29 DIAGNOSIS — M79602 Pain in left arm: Secondary | ICD-10-CM | POA: Diagnosis not present

## 2021-07-29 DIAGNOSIS — N3000 Acute cystitis without hematuria: Secondary | ICD-10-CM | POA: Diagnosis not present

## 2021-07-29 DIAGNOSIS — R571 Hypovolemic shock: Secondary | ICD-10-CM | POA: Diagnosis present

## 2021-07-29 DIAGNOSIS — R627 Adult failure to thrive: Secondary | ICD-10-CM | POA: Diagnosis present

## 2021-07-29 DIAGNOSIS — E1122 Type 2 diabetes mellitus with diabetic chronic kidney disease: Secondary | ICD-10-CM | POA: Diagnosis present

## 2021-07-29 DIAGNOSIS — D631 Anemia in chronic kidney disease: Secondary | ICD-10-CM | POA: Diagnosis present

## 2021-07-29 DIAGNOSIS — I9589 Other hypotension: Secondary | ICD-10-CM | POA: Diagnosis not present

## 2021-07-29 DIAGNOSIS — R109 Unspecified abdominal pain: Secondary | ICD-10-CM | POA: Diagnosis not present

## 2021-07-29 DIAGNOSIS — E440 Moderate protein-calorie malnutrition: Secondary | ICD-10-CM | POA: Diagnosis present

## 2021-07-29 DIAGNOSIS — N1832 Chronic kidney disease, stage 3b: Secondary | ICD-10-CM | POA: Diagnosis not present

## 2021-07-29 DIAGNOSIS — E875 Hyperkalemia: Secondary | ICD-10-CM | POA: Diagnosis not present

## 2021-07-29 DIAGNOSIS — I959 Hypotension, unspecified: Secondary | ICD-10-CM

## 2021-07-29 DIAGNOSIS — G9341 Metabolic encephalopathy: Secondary | ICD-10-CM | POA: Diagnosis not present

## 2021-07-29 DIAGNOSIS — R4182 Altered mental status, unspecified: Secondary | ICD-10-CM | POA: Diagnosis not present

## 2021-07-29 DIAGNOSIS — K8020 Calculus of gallbladder without cholecystitis without obstruction: Secondary | ICD-10-CM | POA: Diagnosis not present

## 2021-07-29 DIAGNOSIS — E860 Dehydration: Secondary | ICD-10-CM | POA: Diagnosis present

## 2021-07-29 DIAGNOSIS — A419 Sepsis, unspecified organism: Secondary | ICD-10-CM | POA: Diagnosis present

## 2021-07-29 DIAGNOSIS — Z7401 Bed confinement status: Secondary | ICD-10-CM | POA: Diagnosis not present

## 2021-07-29 DIAGNOSIS — Z7189 Other specified counseling: Secondary | ICD-10-CM | POA: Diagnosis not present

## 2021-07-29 DIAGNOSIS — Z79891 Long term (current) use of opiate analgesic: Secondary | ICD-10-CM

## 2021-07-29 DIAGNOSIS — M2550 Pain in unspecified joint: Secondary | ICD-10-CM | POA: Diagnosis not present

## 2021-07-29 DIAGNOSIS — R531 Weakness: Secondary | ICD-10-CM | POA: Diagnosis not present

## 2021-07-29 DIAGNOSIS — N390 Urinary tract infection, site not specified: Secondary | ICD-10-CM | POA: Diagnosis present

## 2021-07-29 DIAGNOSIS — R0902 Hypoxemia: Secondary | ICD-10-CM | POA: Diagnosis not present

## 2021-07-29 DIAGNOSIS — L8990 Pressure ulcer of unspecified site, unspecified stage: Secondary | ICD-10-CM | POA: Insufficient documentation

## 2021-07-29 DIAGNOSIS — I129 Hypertensive chronic kidney disease with stage 1 through stage 4 chronic kidney disease, or unspecified chronic kidney disease: Secondary | ICD-10-CM | POA: Diagnosis present

## 2021-07-29 DIAGNOSIS — I495 Sick sinus syndrome: Secondary | ICD-10-CM | POA: Diagnosis not present

## 2021-07-29 DIAGNOSIS — K219 Gastro-esophageal reflux disease without esophagitis: Secondary | ICD-10-CM | POA: Diagnosis present

## 2021-07-29 DIAGNOSIS — E861 Hypovolemia: Secondary | ICD-10-CM | POA: Diagnosis not present

## 2021-07-29 DIAGNOSIS — Z79899 Other long term (current) drug therapy: Secondary | ICD-10-CM

## 2021-07-29 DIAGNOSIS — K5900 Constipation, unspecified: Secondary | ICD-10-CM | POA: Diagnosis not present

## 2021-07-29 DIAGNOSIS — R400 Somnolence: Secondary | ICD-10-CM | POA: Diagnosis not present

## 2021-07-29 DIAGNOSIS — Z833 Family history of diabetes mellitus: Secondary | ICD-10-CM

## 2021-07-29 DIAGNOSIS — Z888 Allergy status to other drugs, medicaments and biological substances status: Secondary | ICD-10-CM

## 2021-07-29 DIAGNOSIS — Z7982 Long term (current) use of aspirin: Secondary | ICD-10-CM

## 2021-07-29 DIAGNOSIS — E878 Other disorders of electrolyte and fluid balance, not elsewhere classified: Secondary | ICD-10-CM | POA: Diagnosis present

## 2021-07-29 DIAGNOSIS — Z87891 Personal history of nicotine dependence: Secondary | ICD-10-CM | POA: Diagnosis not present

## 2021-07-29 DIAGNOSIS — M109 Gout, unspecified: Secondary | ICD-10-CM | POA: Diagnosis present

## 2021-07-29 DIAGNOSIS — Z8249 Family history of ischemic heart disease and other diseases of the circulatory system: Secondary | ICD-10-CM

## 2021-07-29 DIAGNOSIS — E118 Type 2 diabetes mellitus with unspecified complications: Secondary | ICD-10-CM | POA: Diagnosis not present

## 2021-07-29 DIAGNOSIS — B957 Other staphylococcus as the cause of diseases classified elsewhere: Secondary | ICD-10-CM | POA: Diagnosis present

## 2021-07-29 DIAGNOSIS — R32 Unspecified urinary incontinence: Secondary | ICD-10-CM | POA: Diagnosis present

## 2021-07-29 DIAGNOSIS — Z9841 Cataract extraction status, right eye: Secondary | ICD-10-CM

## 2021-07-29 HISTORY — DX: Unspecified dementia, unspecified severity, without behavioral disturbance, psychotic disturbance, mood disturbance, and anxiety: F03.90

## 2021-07-29 LAB — CBC WITH DIFFERENTIAL/PLATELET
Abs Immature Granulocytes: 0.05 10*3/uL (ref 0.00–0.07)
Basophils Absolute: 0 10*3/uL (ref 0.0–0.1)
Basophils Relative: 0 %
Eosinophils Absolute: 0.1 10*3/uL (ref 0.0–0.5)
Eosinophils Relative: 1 %
HCT: 36.6 % — ABNORMAL LOW (ref 39.0–52.0)
Hemoglobin: 12.4 g/dL — ABNORMAL LOW (ref 13.0–17.0)
Immature Granulocytes: 1 %
Lymphocytes Relative: 11 %
Lymphs Abs: 1.1 10*3/uL (ref 0.7–4.0)
MCH: 32.5 pg (ref 26.0–34.0)
MCHC: 33.9 g/dL (ref 30.0–36.0)
MCV: 96.1 fL (ref 80.0–100.0)
Monocytes Absolute: 0.6 10*3/uL (ref 0.1–1.0)
Monocytes Relative: 6 %
Neutro Abs: 8.4 10*3/uL — ABNORMAL HIGH (ref 1.7–7.7)
Neutrophils Relative %: 81 %
Platelets: 277 10*3/uL (ref 150–400)
RBC: 3.81 MIL/uL — ABNORMAL LOW (ref 4.22–5.81)
RDW: 16 % — ABNORMAL HIGH (ref 11.5–15.5)
WBC: 10.3 10*3/uL (ref 4.0–10.5)
nRBC: 0 % (ref 0.0–0.2)

## 2021-07-29 LAB — LACTIC ACID, PLASMA
Lactic Acid, Venous: 1.5 mmol/L (ref 0.5–1.9)
Lactic Acid, Venous: 1.4 mmol/L (ref 0.5–1.9)

## 2021-07-29 LAB — COMPREHENSIVE METABOLIC PANEL
ALT: 9 U/L (ref 0–44)
AST: 15 U/L (ref 15–41)
Albumin: 2.9 g/dL — ABNORMAL LOW (ref 3.5–5.0)
Alkaline Phosphatase: 49 U/L (ref 38–126)
Anion gap: 12 (ref 5–15)
BUN: 155 mg/dL — ABNORMAL HIGH (ref 8–23)
CO2: 17 mmol/L — ABNORMAL LOW (ref 22–32)
Calcium: 8.7 mg/dL — ABNORMAL LOW (ref 8.9–10.3)
Chloride: 110 mmol/L (ref 98–111)
Creatinine, Ser: 7.12 mg/dL — ABNORMAL HIGH (ref 0.61–1.24)
GFR, Estimated: 7 mL/min — ABNORMAL LOW (ref >60)
Glucose, Bld: 142 mg/dL — ABNORMAL HIGH (ref 70–99)
Potassium: 5.6 mmol/L — ABNORMAL HIGH (ref 3.5–5.1)
Sodium: 139 mmol/L (ref 135–145)
Total Bilirubin: 0.6 mg/dL (ref 0.3–1.2)
Total Protein: 5.4 g/dL — ABNORMAL LOW (ref 6.5–8.1)

## 2021-07-29 LAB — CK: Total CK: 238 U/L (ref 49–397)

## 2021-07-29 LAB — URINALYSIS, ROUTINE W REFLEX MICROSCOPIC
Bilirubin Urine: NEGATIVE
Glucose, UA: NEGATIVE mg/dL
Ketones, ur: 5 mg/dL — AB
Nitrite: NEGATIVE
Protein, ur: 100 mg/dL — AB
Specific Gravity, Urine: 1.014 (ref 1.005–1.030)
WBC, UA: >50 WBC/hpf — ABNORMAL HIGH (ref 0–5)
pH: 5 (ref 5.0–8.0)

## 2021-07-29 MED ORDER — SODIUM CHLORIDE 0.9 % IV BOLUS
1000.0000 mL | Freq: Once | INTRAVENOUS | Status: AC
Start: 2021-07-29 — End: 2021-07-29
  Administered 2021-07-29: 1000 mL via INTRAVENOUS

## 2021-07-29 MED ORDER — LACTATED RINGERS IV BOLUS
1000.0000 mL | Freq: Once | INTRAVENOUS | Status: AC
  Administered 2021-07-29: 1000 mL via INTRAVENOUS

## 2021-07-29 MED ORDER — SODIUM CHLORIDE 0.9 % IV SOLN
2.0000 g | INTRAVENOUS | Status: DC
  Administered 2021-07-29 – 2021-08-02 (×5): 2 g via INTRAVENOUS
  Filled 2021-07-29 (×5): qty 20

## 2021-07-29 MED ORDER — LACTATED RINGERS IV BOLUS
500.0000 mL | Freq: Once | INTRAVENOUS | Status: AC
  Administered 2021-07-29: 500 mL via INTRAVENOUS

## 2021-07-29 MED ORDER — LACTATED RINGERS IV BOLUS
1000.0000 mL | Freq: Once | INTRAVENOUS | Status: AC
  Administered 2021-07-30: 1000 mL via INTRAVENOUS

## 2021-07-29 NOTE — ED Notes (Signed)
Patient transported to CT 

## 2021-07-29 NOTE — ED Notes (Signed)
Back from CT at this time.

## 2021-07-29 NOTE — ED Provider Notes (Signed)
?[place] ?Provider Note ? ? ?CSN: [id number] ?Arrival date & time: 07/29/21  1847 ? ?  ? ?History ? ?Chief Complaint  ?Patient presents with  ? Fatigue  ? ? ?Noah Adkins is a 88 y.o. male. ? ?HPI ?Patient presents with altered mental status.  Does have some baseline dementia but worse the last week.  Decreased oral intake.  Not really wanting to get out of bed.  Sleeping all the time.  No fevers.  Has had a cough but states that has been going for years.  History comes from patient's family numbers.  Patient himself cannot really provide any history. ?  ? ?Home Medications ?Prior to Admission medications   ?Medication Sig Start Date End Date Taking? Authorizing Provider  ?acetaminophen (TYLENOL) 500 MG tablet 1 tablet as needed    [provider]  ?Alcohol Swabs (ALCOHOL PADS) 70 % PADS See admin instructions.    [provider]  ?allopurinol (ZYLOPRIM) 100 MG tablet Take 1 tablet (100 mg total) by mouth daily. 04/04/20   Medina-Vargas, Monina C, NP  ?allopurinol (ZYLOPRIM) 300 MG tablet 1 tablet 09/30/13   [provider]  ?aspirin 325 MG tablet 1 capsule    [provider]  ?bisacodyl (DULCOLAX) 10 MG suppository Place 10 mg rectally daily as needed for moderate constipation.    [provider]  ?Blood Glucose Monitoring Suppl (TRUE METRIX METER) w/Device KIT See admin instructions.    [provider]  ?diphenhydrAMINE (BENADRYL) 25 mg capsule diphenhydramine 25 mg capsule ?  25 mg by oral route.    [provider]  ?diphenoxylate-atropine (LOMOTIL) 2.5-0.025 MG tablet diphenoxylate-atropine 2.5 mg-0.025 mg tablet    [provider]  ?donepezil (ARICEPT) 10 MG tablet Take 1 tablet (10 mg total) by mouth daily. 04/04/20   Medina-Vargas, Monina C, NP  ?DULoxetine (CYMBALTA) 60 MG capsule Take 1 capsule (60 mg total) by mouth daily. 04/04/20   Medina-Vargas, Monina C, NP  ?DULoxetine (CYMBALTA) 60 MG capsule 1  capsule    [provider]  ?DULoxetine (CYMBALTA) 60 MG capsule 1 capsule    [provider]  ?fluticasone (FLONASE) 50 MCG/ACT nasal spray Place 1 spray into both nostrils daily. 04/04/20   Medina-Vargas, Monina C, NP  ?glipiZIDE (GLUCOTROL XL) 2.5 MG 24 hr tablet Take 1 tablet (2.5 mg total) by mouth daily. 04/04/20   Medina-Vargas, Senaida Lange, NP  ?Glucerna (Friona) LIQD See admin instructions.    [provider]  ?glucose blood (TRUE METRIX BLOOD GLUCOSE TEST) test strip See admin instructions.    [provider]  ?guaiFENesin-dextromethorphan (ROBITUSSIN DM) 100-10 MG/5ML syrup Take 5 mLs by mouth every 4 (four) hours as needed for cough.    [provider]  ?ibuprofen (ADVIL) 200 MG tablet ibuprofen 200 mg tablet ?  0 mg by oral route.    [provider]  ?linaclotide Rolan Lipa) 145 MCG CAPS capsule 1 capsule at least 30 minutes before the first meal of the day on an empty stomach 04/16/20   [provider]  ?lisinopril-hydrochlorothiazide (ZESTORETIC) 20-25 MG tablet Take 1 tablet by mouth daily.    [provider]  ?magnesium hydroxide (MILK OF MAGNESIA) 400 MG/5ML suspension Take by mouth daily as needed for mild constipation.    [provider]  ?Melatonin 3 MG CAPS Take 1 capsule (3 mg total) by mouth at bedtime. 04/04/20   Medina-Vargas, Monina C, NP  ?memantine (NAMENDA) 10 MG tablet Take 1 tablet by  mouth daily.    [provider]  ?memantine (NAMENDA) 10 MG tablet 1 tablet    [provider]  ?NON FORMULARY 120ML MED PASS SF DAILY 03/12/20   [provider]  ?omeprazole (PRILOSEC) 40 MG capsule Take 1 capsule (40 mg total) by mouth daily. 04/04/20   Medina-Vargas, Monina C, NP  ?omeprazole (PRILOSEC) 40 MG capsule 1 capsule 04/22/17   [provider]  ?oxybutynin (DITROPAN) 5 MG tablet Take 1 tablet (5 mg total) by mouth 2 (two) times daily. 04/04/20   Medina-Vargas, Monina C, NP  ?oxybutynin  (DITROPAN) 5 MG tablet Take 1 tablet by mouth 2 (two) times daily.    [provider]  ?oxyCODONE (OXY IR/ROXICODONE) 5 MG immediate release tablet Take 1 tablet (5 mg total) by mouth every 8 (eight) hours as needed for severe pain. 04/04/20   Medina-Vargas, Monina C, NP  ?oxyCODONE-acetaminophen (PERCOCET/ROXICET) 5-325 MG tablet 1 tablet as needed 08/04/20   [provider]  ?OXYGEN Inhale 2 L into the lungs continuous. 03/11/20   [provider]  ?simvastatin (ZOCOR) 10 MG tablet Take 1 tablet (10 mg total) by mouth at bedtime. 04/04/20   Medina-Vargas, Monina C, NP  ?simvastatin (ZOCOR) 10 MG tablet Take 1 tablet by mouth every evening.    [provider]  ?Sodium Phosphates (RA SALINE ENEMA RE) Place 1 each rectally daily as needed (Constipation).    [provider]  ?solifenacin (VESICARE) 10 MG tablet Vesicare 10 mg tablet ?  10 mg by oral route.    [provider]  ?sulfamethoxazole-trimethoprim (BACTRIM DS) 800-160 MG tablet  01/12/21   [provider]  ?tetrahydrozoline 0.05 % ophthalmic solution Place 1 drop into both eyes as needed (dry eyes).    [provider]  ?traZODone (DESYREL) 150 MG tablet Take 1 tablet (150 mg total) by mouth at bedtime. 04/04/20   Medina-Vargas, Monina C, NP  ?TRUEplus Lancets 28G MISC See admin instructions.    [provider]  ?   ? ?Allergies    ?Atenolol, Dilacor xr [diltiazem hcl], Isoptin sr [verapamil hcl er], Lopressor [metoprolol tartrate], and Norvasc [amlodipine besylate]   ? ?Review of Systems   ?Review of Systems  ?Psychiatric/Behavioral:  Positive for confusion.   ? ?Physical Exam ?Updated Vital Signs ?BP (!) 84/48   Pulse 81   Temp (!) 96.2 ?F (35.7 ?C) (Rectal)   Resp (!) 25   SpO2 100%  ?Physical Exam ?Vitals reviewed.  ?HENT:  ?   Head: Normocephalic.  ?Eyes:  ?   Pupils: Pupils are equal, round, and reactive to light.  ?Cardiovascular:  ?   Rate and Rhythm: Tachycardia present.   ?Abdominal:  ?   Tenderness: There is abdominal tenderness.  ?   Comments: Mild lower abdominal tenderness.  No rebound or guarding.  No mass.  ?Musculoskeletal:     ?   General: No tenderness.  ?   Cervical back: Neck supple.  ?Skin: ?   General: Skin is warm.  ?   Capillary Refill: Capillary refill takes less than 2 seconds.  ?Neurological:  ?   Comments: Confused.  Moving extremities.  Able to speak but somewhat confused.  ? ? ?ED Results / Procedures / Treatments   ?Labs ?(all labs ordered are listed, but only abnormal results are displayed) ?Labs Reviewed  ?CBC WITH DIFFERENTIAL/PLATELET - Abnormal; Notable for the following components:  ?    Result Value  ? RBC 3.81 (*)   ? Hemoglobin 12.4 (*)   ?  HCT 36.6 (*)   ? RDW 16.0 (*)   ? Neutro Abs 8.4 (*)   ? All other components within normal limits  ?URINALYSIS, ROUTINE W REFLEX MICROSCOPIC - Abnormal; Notable for the following components:  ? APPearance CLOUDY (*)   ? Hgb urine dipstick SMALL (*)   ? Ketones, ur 5 (*)   ? Protein, ur 100 (*)   ? Leukocytes,Ua LARGE (*)   ? WBC, UA >50 (*)   ? Bacteria, UA RARE (*)   ? All other components within normal limits  ?COMPREHENSIVE METABOLIC PANEL - Abnormal; Notable for the following components:  ? Potassium 5.6 (*)   ? CO2 17 (*)   ? Glucose, Bld 142 (*)   ? BUN 155 (*)   ? Creatinine, Ser 7.12 (*)   ? Calcium 8.7 (*)   ? Total Protein 5.4 (*)   ? Albumin 2.9 (*)   ? GFR, Estimated 7 (*)   ? All other components within normal limits  ?URINE CULTURE  ?CULTURE, BLOOD (ROUTINE X 2)  ?CULTURE, BLOOD (ROUTINE X 2)  ?LACTIC ACID, PLASMA  ?LACTIC ACID, PLASMA  ?CK  ? ? ?EKG ?EKG Interpretation ? ?Date/Time:  Wednesday Jul 29 2021 19:54:13 EDT ?Ventricular Rate:  76 ?PR Interval:  211 ?QRS Duration: 136 ?QT Interval:  435 ?QTC Calculation: 490 ?R Axis:   -88 ?Text Interpretation: vent pacing IVCD, consider atypical RBBB Anterolateral infarct, old ST elevation, consider inferior injury Confirmed by Davonna Belling [phone]) on  07/29/2021 10:27:06 PM ? ?Radiology ?CT HEAD WO CONTRAST (5MM) ? ?Result Date: 07/29/2021 ?CLINICAL DATA:  Altered level of consciousness EXAM: CT HEAD WITHOUT CONTRAST TECHNIQUE: Contiguous axial images were obtained f

## 2021-07-29 NOTE — ED Notes (Signed)
This RN spoke with lab who reports that the patient's CMP has hemolyzed. This RN to redraw at this time.  ?

## 2021-07-29 NOTE — ED Notes (Signed)
Provider notified of the patient's tempeture; beir hugger applied on medium heat at this time; will recheck rectal temp  ?

## 2021-07-29 NOTE — ED Triage Notes (Signed)
Pt BIB EMS after family reports patient being lethargic for the past couple of days (not getting out of bed no energy and wanting to sleep the entire day), which family states is not normal. Patient does have a hx of confusion and is Axox2 at baseline.  ? ?VSS w/ EMS ? ?

## 2021-07-29 NOTE — Sepsis Progress Note (Signed)
Sepsis protocol is being followed by [facility]. 

## 2021-07-30 ENCOUNTER — Emergency Department (HOSPITAL_COMMUNITY): Payer: Medicare HMO

## 2021-07-30 ENCOUNTER — Encounter (HOSPITAL_COMMUNITY): Payer: Self-pay | Admitting: Internal Medicine

## 2021-07-30 DIAGNOSIS — N3000 Acute cystitis without hematuria: Secondary | ICD-10-CM

## 2021-07-30 DIAGNOSIS — K5730 Diverticulosis of large intestine without perforation or abscess without bleeding: Secondary | ICD-10-CM | POA: Diagnosis not present

## 2021-07-30 DIAGNOSIS — Z6825 Body mass index (BMI) 25.0-25.9, adult: Secondary | ICD-10-CM | POA: Diagnosis not present

## 2021-07-30 DIAGNOSIS — L89151 Pressure ulcer of sacral region, stage 1: Secondary | ICD-10-CM | POA: Diagnosis present

## 2021-07-30 DIAGNOSIS — E875 Hyperkalemia: Secondary | ICD-10-CM | POA: Diagnosis present

## 2021-07-30 DIAGNOSIS — Z87891 Personal history of nicotine dependence: Secondary | ICD-10-CM | POA: Diagnosis not present

## 2021-07-30 DIAGNOSIS — R7881 Bacteremia: Secondary | ICD-10-CM | POA: Diagnosis present

## 2021-07-30 DIAGNOSIS — N390 Urinary tract infection, site not specified: Secondary | ICD-10-CM | POA: Diagnosis present

## 2021-07-30 DIAGNOSIS — Z7189 Other specified counseling: Secondary | ICD-10-CM | POA: Diagnosis not present

## 2021-07-30 DIAGNOSIS — N1832 Chronic kidney disease, stage 3b: Secondary | ICD-10-CM | POA: Diagnosis present

## 2021-07-30 DIAGNOSIS — R6520 Severe sepsis without septic shock: Secondary | ICD-10-CM | POA: Diagnosis not present

## 2021-07-30 DIAGNOSIS — R571 Hypovolemic shock: Secondary | ICD-10-CM | POA: Diagnosis present

## 2021-07-30 DIAGNOSIS — M79602 Pain in left arm: Secondary | ICD-10-CM | POA: Diagnosis not present

## 2021-07-30 DIAGNOSIS — R627 Adult failure to thrive: Secondary | ICD-10-CM | POA: Diagnosis present

## 2021-07-30 DIAGNOSIS — D631 Anemia in chronic kidney disease: Secondary | ICD-10-CM | POA: Diagnosis present

## 2021-07-30 DIAGNOSIS — K8020 Calculus of gallbladder without cholecystitis without obstruction: Secondary | ICD-10-CM | POA: Diagnosis not present

## 2021-07-30 DIAGNOSIS — Z950 Presence of cardiac pacemaker: Secondary | ICD-10-CM | POA: Diagnosis not present

## 2021-07-30 DIAGNOSIS — G9341 Metabolic encephalopathy: Secondary | ICD-10-CM | POA: Diagnosis not present

## 2021-07-30 DIAGNOSIS — E872 Acidosis, unspecified: Secondary | ICD-10-CM | POA: Diagnosis not present

## 2021-07-30 DIAGNOSIS — F0390 Unspecified dementia without behavioral disturbance: Secondary | ICD-10-CM | POA: Diagnosis present

## 2021-07-30 DIAGNOSIS — E861 Hypovolemia: Secondary | ICD-10-CM | POA: Diagnosis not present

## 2021-07-30 DIAGNOSIS — N179 Acute kidney failure, unspecified: Secondary | ICD-10-CM | POA: Diagnosis not present

## 2021-07-30 DIAGNOSIS — I9589 Other hypotension: Secondary | ICD-10-CM | POA: Diagnosis not present

## 2021-07-30 DIAGNOSIS — I959 Hypotension, unspecified: Secondary | ICD-10-CM | POA: Diagnosis not present

## 2021-07-30 DIAGNOSIS — E118 Type 2 diabetes mellitus with unspecified complications: Secondary | ICD-10-CM

## 2021-07-30 DIAGNOSIS — E1122 Type 2 diabetes mellitus with diabetic chronic kidney disease: Secondary | ICD-10-CM | POA: Diagnosis present

## 2021-07-30 DIAGNOSIS — Z515 Encounter for palliative care: Secondary | ICD-10-CM | POA: Diagnosis not present

## 2021-07-30 DIAGNOSIS — E860 Dehydration: Secondary | ICD-10-CM | POA: Diagnosis present

## 2021-07-30 DIAGNOSIS — Z66 Do not resuscitate: Secondary | ICD-10-CM | POA: Diagnosis not present

## 2021-07-30 DIAGNOSIS — A419 Sepsis, unspecified organism: Secondary | ICD-10-CM

## 2021-07-30 DIAGNOSIS — I129 Hypertensive chronic kidney disease with stage 1 through stage 4 chronic kidney disease, or unspecified chronic kidney disease: Secondary | ICD-10-CM | POA: Diagnosis present

## 2021-07-30 DIAGNOSIS — I10 Essential (primary) hypertension: Secondary | ICD-10-CM | POA: Diagnosis not present

## 2021-07-30 DIAGNOSIS — E440 Moderate protein-calorie malnutrition: Secondary | ICD-10-CM | POA: Diagnosis not present

## 2021-07-30 DIAGNOSIS — E878 Other disorders of electrolyte and fluid balance, not elsewhere classified: Secondary | ICD-10-CM | POA: Diagnosis present

## 2021-07-30 DIAGNOSIS — I495 Sick sinus syndrome: Secondary | ICD-10-CM | POA: Diagnosis not present

## 2021-07-30 DIAGNOSIS — K5900 Constipation, unspecified: Secondary | ICD-10-CM | POA: Diagnosis not present

## 2021-07-30 DIAGNOSIS — R4182 Altered mental status, unspecified: Secondary | ICD-10-CM | POA: Diagnosis not present

## 2021-07-30 LAB — BLOOD CULTURE ID PANEL (REFLEXED) - BCID2

## 2021-07-30 LAB — COMPREHENSIVE METABOLIC PANEL
ALT: 10 U/L (ref 0–44)
AST: 18 U/L (ref 15–41)
Albumin: 3.1 g/dL — ABNORMAL LOW (ref 3.5–5.0)
Alkaline Phosphatase: 52 U/L (ref 38–126)
Anion gap: 13 (ref 5–15)
BUN: 148 mg/dL — ABNORMAL HIGH (ref 8–23)
CO2: 19 mmol/L — ABNORMAL LOW (ref 22–32)
Calcium: 9 mg/dL (ref 8.9–10.3)
Chloride: 108 mmol/L (ref 98–111)
Creatinine, Ser: 6.7 mg/dL — ABNORMAL HIGH (ref 0.61–1.24)
GFR, Estimated: 7 mL/min — ABNORMAL LOW (ref >60)
Glucose, Bld: 80 mg/dL (ref 70–99)
Potassium: 5.1 mmol/L (ref 3.5–5.1)
Sodium: 140 mmol/L (ref 135–145)
Total Bilirubin: 0.6 mg/dL (ref 0.3–1.2)
Total Protein: 5.7 g/dL — ABNORMAL LOW (ref 6.5–8.1)

## 2021-07-30 LAB — CBC WITH DIFFERENTIAL/PLATELET
Abs Immature Granulocytes: 0.06 10*3/uL (ref 0.00–0.07)
Basophils Absolute: 0 10*3/uL (ref 0.0–0.1)
Basophils Relative: 0 %
Eosinophils Absolute: 0.2 10*3/uL (ref 0.0–0.5)
Eosinophils Relative: 2 %
HCT: 30.2 % — ABNORMAL LOW (ref 39.0–52.0)
Hemoglobin: 10.5 g/dL — ABNORMAL LOW (ref 13.0–17.0)
Immature Granulocytes: 1 %
Lymphocytes Relative: 11 %
Lymphs Abs: 1.1 10*3/uL (ref 0.7–4.0)
MCH: 33 pg (ref 26.0–34.0)
MCHC: 34.8 g/dL (ref 30.0–36.0)
MCV: 95 fL (ref 80.0–100.0)
Monocytes Absolute: 0.5 10*3/uL (ref 0.1–1.0)
Monocytes Relative: 5 %
Neutro Abs: 7.7 10*3/uL (ref 1.7–7.7)
Neutrophils Relative %: 81 %
Platelets: 255 10*3/uL (ref 150–400)
RBC: 3.18 MIL/uL — ABNORMAL LOW (ref 4.22–5.81)
RDW: 16.1 % — ABNORMAL HIGH (ref 11.5–15.5)
WBC: 9.6 10*3/uL (ref 4.0–10.5)
nRBC: 0 % (ref 0.0–0.2)

## 2021-07-30 LAB — I-STAT VENOUS BLOOD GAS, ED
Acid-base deficit: 8 mmol/L — ABNORMAL HIGH (ref 0.0–2.0)
Bicarbonate: 17 mmol/L — ABNORMAL LOW (ref 20.0–28.0)
Calcium, Ion: 1.22 mmol/L (ref 1.15–1.40)
HCT: 32 % — ABNORMAL LOW (ref 39.0–52.0)
Hemoglobin: 10.9 g/dL — ABNORMAL LOW (ref 13.0–17.0)
O2 Saturation: 80 %
Potassium: 5 mmol/L (ref 3.5–5.1)
Sodium: 141 mmol/L (ref 135–145)
TCO2: 18 mmol/L — ABNORMAL LOW (ref 22–32)
pCO2, Ven: 31.2 mmHg — ABNORMAL LOW (ref 44–60)
pH, Ven: 7.345 (ref 7.25–7.43)
pO2, Ven: 46 mmHg — ABNORMAL HIGH (ref 32–45)

## 2021-07-30 LAB — CBG MONITORING, ED
Glucose-Capillary: 63 mg/dL — ABNORMAL LOW (ref 70–99)
Glucose-Capillary: 91 mg/dL (ref 70–99)
Glucose-Capillary: 97 mg/dL (ref 70–99)
Glucose-Capillary: 79 mg/dL (ref 70–99)

## 2021-07-30 LAB — PROTIME-INR
INR: 1 (ref 0.8–1.2)
Prothrombin Time: 13.5 seconds (ref 11.4–15.2)

## 2021-07-30 LAB — GLUCOSE, CAPILLARY
Glucose-Capillary: 95 mg/dL (ref 70–99)
Glucose-Capillary: 95 mg/dL (ref 70–99)

## 2021-07-30 LAB — TSH: TSH: 0.631 u[IU]/mL (ref 0.350–4.500)

## 2021-07-30 LAB — PHOSPHORUS: Phosphorus: 5.1 mg/dL — ABNORMAL HIGH (ref 2.5–4.6)

## 2021-07-30 LAB — MAGNESIUM: Magnesium: 1.9 mg/dL (ref 1.7–2.4)

## 2021-07-30 MED ORDER — LACTATED RINGERS IV BOLUS
500.0000 mL | Freq: Once | INTRAVENOUS | Status: AC
  Administered 2021-07-31: 500 mL via INTRAVENOUS

## 2021-07-30 MED ORDER — ACETAMINOPHEN 325 MG PO TABS
650.0000 mg | ORAL_TABLET | Freq: Four times a day (QID) | ORAL | Status: DC | PRN
Start: 2021-07-30 — End: 2021-08-03
  Administered 2021-08-02: 650 mg via ORAL
  Filled 2021-07-30: qty 2

## 2021-07-30 MED ORDER — ACETAMINOPHEN 650 MG RE SUPP: 650.0000 mg | Freq: Four times a day (QID) | RECTAL | Status: DC | PRN

## 2021-07-30 MED ORDER — LACTATED RINGERS IV BOLUS
500.0000 mL | Freq: Once | INTRAVENOUS | Status: AC
  Administered 2021-07-30: 500 mL via INTRAVENOUS

## 2021-07-30 MED ORDER — INSULIN ASPART 100 UNIT/ML IJ SOLN
0.0000 [IU] | Freq: Four times a day (QID) | INTRAMUSCULAR | Status: DC
  Administered 2021-08-02: 1 [IU] via SUBCUTANEOUS
  Administered 2021-08-02 (×2): 2 [IU] via SUBCUTANEOUS
  Administered 2021-08-03: 1 [IU] via SUBCUTANEOUS

## 2021-07-30 MED ORDER — DEXTROSE 50 % IV SOLN
1.0000 | INTRAVENOUS | Status: DC | PRN
Start: 2021-07-30 — End: 2021-08-03

## 2021-07-30 MED ORDER — OXYCODONE HCL 5 MG PO TABS
2.5000 mg | ORAL_TABLET | Freq: Once | ORAL | Status: AC
  Administered 2021-07-30: 2.5 mg via ORAL
  Filled 2021-07-30: qty 1

## 2021-07-30 MED ORDER — ENSURE ENLIVE PO LIQD
237.0000 mL | Freq: Two times a day (BID) | ORAL | Status: DC
  Administered 2021-07-31 – 2021-08-03 (×6): 237 mL via ORAL

## 2021-07-30 MED ORDER — LACTATED RINGERS IV SOLN: INTRAVENOUS | Status: DC

## 2021-07-30 NOTE — ED Notes (Signed)
Patient transported to CT 

## 2021-07-30 NOTE — Progress Notes (Signed)
?  Progress Note ? ? ?Patient: Noah Adkins [id number] DOB: 1926/05/01 DOA: 07/29/2021     0 ?DOS: the patient was seen and examined on 07/30/2021 at 7:33AM ?  ? ? ? ?Brief hospital course: ?Mr. Noah Adkins is a 88 y.o. M with DM, chronic kidney disease who presented with slowly progressive decreased responsiveness over the last week. ? ?In the ER, he was hypotensive, started on IV fluids.  Critical care evaluated patient and discussed with family, who felt patient would not wish for pressors, intubation.   ? ?Planned to admit to medicine with IV fluids, IV antibiotics and monitor. ? ? ? ? ?Assessment and Plan: ?* Acute renal failure superimposed on stage 3b chronic kidney disease (Noah Adkins) ?BUN still >140, Cr still 6.7 mg/dL.  ?- Continue IV fluids, trend Cr  ? ? ?Acute metabolic encephalopathy ?  ? ? ?Hyperkalemia ?  ? ? ?Severe sepsis (Noah Adkins) ?- Continue antibiotics, fluids ? ? ?Hypovolemic shock (Noah Adkins) ?  ? ?Sick sinus syndrome (Noah Adkins) ?  ? ?Malnutrition of moderate degree (Noah Adkins: 60% to less than 75% of standard weight) (Noah Adkins) ?  ? ?Dementia (Noah Adkins) ?  ? ?Anemia in chronic kidney disease ?  ? ?Type 2 diabetes mellitus with chronic kidney disease, without long-term current use of insulin (Noah Adkins) ?  ? ? ?Pacemaker ?  ? ?Essential hypertension ?  ? ? ? ? ? ? ? ? ? ? ? ? ? ?Physical Exam: ?Vitals:  ? 07/30/21 0600 07/30/21 0630 07/30/21 0700 07/30/21 0742  ?BP: (!) 106/57 108/82 (!) 90/55   ?Pulse: (!) 108 (!) 108 (!) 119   ?Resp: 15 (!) 24 19   ?Temp:    97.7 ?F (36.5 ?C)  ?TempSrc:    Oral  ?SpO2: 100% 100% 100%   ? ? ? ?Data Reviewed: ?Creatinine 7.1 down to 6.7 this morning with fluids ?Potassium 5.6 down to 5.1 this morning ?BUN 155 --> 148 ?Bicarb 19 ?CT head normal ?Hemoglobin 10.5, white blood cell count normal ?Glucose normal ?Lactate normal ?CK normal ?This is a no charge note, for further details, please see the H&P by Dr. Velia Adkins  from a few hours ago. ? ? ? ? ?Family Communication:   ? ? ? ?Disposition: ?Status is:  Inpatient ? ? ? ? ? ? ? ? ?Author: ?Noah Dada, MD ?07/30/2021 8:14 AM ? ?For on call review [email or web address].  ? ? ?

## 2021-07-30 NOTE — Hospital Course (Addendum)
88 y.o. M with DM, chronic kidney disease who presented with slowly progressive decreased responsiveness over the last week.In the ER, he was hypotensive, started on IV fluids.  Critical care evaluated patient and discussed with family, who felt patient would not wish for pressors, intubation.   ? And was admited to medicine with IV fluids, IV antibiotics and PMT eval. ?Patient was hydrated with IV fluids for his profound AKI which improved nicely.  Seen by palliative care to further discuss goals of care and family opted to go home with hospice care.  At this time patient remains alert awake with baseline dementia, blood pressure stable, creatinine improved to 1.9, hemoglobin 9.8 g. ?Is being discharged to home with hospice care ?

## 2021-07-30 NOTE — H&P (Signed)
?History and Physical  ? ? ?PLEASE NOTE THAT [employer] SOFTWARE WAS USED IN THE CONSTRUCTION OF THIS NOTE. ? ? ?Noah Adkins RXY:[id number] DOB: 02-16-1927 DOA: 07/29/2021 ? ?PCP: Donald Prose, MD  ?Patient coming from: home  ? ?I have personally briefly reviewed patient's old medical records in [place] ? ?Chief Complaint: Altered mental status ? ?HPI: Noah Adkins is a 88 y.o. male with medical history significant for dementia, essential hypertension, type 2 diabetes mellitus, pacemaker placement in 2017, GERD, urinary incontinence, stage IIIb chronic kidney disease with baseline creatinine 1.5-2.0, who is admitted to [facility] on 07/29/2021 with acute renal failure superimposed on CKD 3B after presenting from home to [facility] ED for evaluation of altered mental status. ? ?In the setting of the patient's altered mental status, the following history is provided via my discussions with the patient's wife as well as by discussions with the patient's son, both of whom are present at bedside, in addition to my discussions with the EDP and via chart review. ? ?In the context of the patient's underlying dementia, patient's wife/son report that the patient has exhibited evidence of progressive confusion relative to his baseline dementia over the course of the last week.  They note that this has been associated with increased somnolence, diminished responsiveness.  Denies any preceding trauma. ? ?Per chart review, the patient has a history of stage IIIb chronic kidney disease associated with baseline creatinine range of 1.5-2.0, with most recent prior serum creatinine data point noted to be 1.97 in January 2022.  Also has a history of essential hypertension, with outpatient antihypertensive medications notable for lisinopril as well as HCTZ.  He is also on chronic opioid therapy at home, specifically via as needed oxycodone. ? ? ? ? ?ED Course:  ?Vital signs in the ED were notable for the following:  Temperature max 97.8; temperature minimum 96.2; heart rate 61-79; initial blood pressure 70/61, subsequently increasing to 105/47 following initiation of IV fluids and IV antibiotics, as further detailed below; respiratory rate 15-25, oxygen saturation 98 to 100% on room air. ? ?Labs were notable for the following: CMP notable for the following: Potassium 5.6, bicarbonate 17, anion gap 12, creatinine 7.12, glucose 142, calcium, corrected for mild hypoalbuminemia, 9.5; albumin 2.9, otherwise, liver enzymes found to be within normal limits.  CPK 238.  Initial lactate 1.5, with repeat trending down to 1.4.  CBC notable for white blood cell count 10,300, with 81% neutrophils.  Urinalysis associated with cloudy appearing specimen with greater than 50 white blood cells, large leukocyte esterase, no squamous epithelial cells, no RBCs, will demonstrating 100 protein.  Blood cultures x2 and urine culture were collected prior to initiation of IV antibiotics. ? ?Imaging and additional notable ED work-up: EKG, in comparison to most recent prior from December 2021 shows ventricular paced rhythm with heart rate 76, T wave inversion in aVL, unchanged from most recent prior EKG, no evidence of ST changes.  Chest x-ray shows no evidence of acute cardiopulmonary process.  CT head shows no evidence of acute intracranial process.  CT chest, abdomen, pelvis shows no evidence of acute intrathoracic, acute intra-abdominal, or acute intrapelvic process, while showing enlarged prostate with findings of chronic bladder outlet obstruction. ? ?While in the ED, the following were administered: Rocephin, normal saline x1 L bolus, lactated Ringer's x2.5 L bolus. ? ?EDP discussed patient's case with on-call critical care physician, Dr. Lynetta Mare, Who has consulted and evaluated the patient in the ED. Per Dr. Michelle Piper  discussions with the patient's wife and son, they convey that the patient is to be DNR/DNI.  They also conveyed that the patient  would not want pressor therapy.  Rather they are amenable to IV fluids and IV antibiotics, and confirmed that they would not want ICU admission. ? ?Per my discussions with the patient's wife and son, they also convey that the patient would not want hemodialysis.  Wife and son confirmed that they would like to proceed with IV fluids and IV antibiotics, with a low threshold for conversion to full comfort care measures if the patient subsequently decompensates, or if no improvement in renal function per morning labs. ? ?Subsequently, the patient was admitted for further evaluation and management of acute renal failure superimposed on stage IIIb chronic kidney disease, presentation also notable for acute metabolic encephalopathy, mild hyperkalemia, severe sepsis due to urinary tract infection, and initial hypotension, which is improved slightly following IV fluids as well as initiation of IV antibiotic. ? ? ? ?Review of Systems: As per HPI otherwise 10 point review of systems negative.  ? ?Past Medical History:  ?Diagnosis Date  ? Arthritis   ? "knees, hands" (10/10/2015)  ? Dementia ([place])   ? History of blood transfusion 1970s  ? "related to work related injury; broke rib; lost blood"  ? History of gout   ? Hypertension   ? Kidney stones   ? Presence of permanent cardiac pacemaker   ? Type II diabetes mellitus ([place])   ? ? ?Past Surgical History:  ?Procedure Laterality Date  ? CATARACT EXTRACTION Right 2015  ? CYSTOSCOPY W/ STONE MANIPULATION  1970s  ? EP IMPLANTABLE DEVICE N/A 10/10/2015  ? Procedure: Pacemaker Implant;  Surgeon: Evans Lance, MD;  Location: [facility];  Service: Cardiovascular;  Laterality: N/A;  ? INSERT / REPLACE / REMOVE PACEMAKER  10/10/2015  ? INTRAMEDULLARY (IM) NAIL INTERTROCHANTERIC Right 03/03/2020  ? Procedure: INTRAMEDULLARY (IM) NAIL INTERTROCHANTRIC;  Surgeon: Rod Can, MD;  Location: [facility];  Service: Orthopedics;  Laterality: Right;  ? ? ?Social History: ? reports that he  has quit smoking. His smoking use included pipe and cigars. He has quit using smokeless tobacco.  His smokeless tobacco use included chew. He reports that he does not drink alcohol and does not use drugs. ? ? ?Allergies  ?Allergen Reactions  ? Atenolol Nausea Only and Other (See Comments)  ?  Dizzy, headache  ? Dilacor Xr [Diltiazem Hcl] Other (See Comments)  ?  NOT EFFECTIVE  ? Isoptin Sr [Verapamil Hcl Er] Other (See Comments)  ?  Erectile dysfunction  ? Lopressor [Metoprolol Tartrate] Nausea Only and Other (See Comments)  ?  dizzy, headache  ? Norvasc [Amlodipine Besylate] Other (See Comments)  ?  Headache and erectile dysfunction  ? ? ?Family History  ?Problem Relation Age of Onset  ? Hypertension Father   ? Diabetes Mother   ? ? ?Family history reviewed and not pertinent  ? ? ?Prior to Admission medications   ?Medication Sig Start Date End Date Taking? Authorizing Provider  ?acetaminophen (TYLENOL) 500 MG tablet 1 tablet as needed    [provider]  ?Alcohol Swabs (ALCOHOL PADS) 70 % PADS See admin instructions.    [provider]  ?allopurinol (ZYLOPRIM) 100 MG tablet Take 1 tablet (100 mg total) by mouth daily. 04/04/20   Medina-Vargas, Monina C, NP  ?allopurinol (ZYLOPRIM) 300 MG tablet 1 tablet 09/30/13   [provider]  ?aspirin 325 MG tablet 1 capsule  [provider]  ?bisacodyl (DULCOLAX) 10 MG suppository Place 10 mg rectally daily as needed for moderate constipation.    [provider]  ?Blood Glucose Monitoring Suppl (TRUE METRIX METER) w/Device KIT See admin instructions.    [provider]  ?diphenhydrAMINE (BENADRYL) 25 mg capsule diphenhydramine 25 mg capsule ?  25 mg by oral route.    [provider]  ?diphenoxylate-atropine (LOMOTIL) 2.5-0.025 MG tablet diphenoxylate-atropine 2.5 mg-0.025 mg tablet    [provider]  ?donepezil (ARICEPT) 10 MG tablet Take 1 tablet (10 mg total) by mouth daily. 04/04/20   Medina-Vargas,  Monina C, NP  ?DULoxetine (CYMBALTA) 60 MG capsule Take 1 capsule (60 mg total) by mouth daily. 04/04/20   Medina-Vargas, Monina C, NP  ?DULoxetine (CYMBALTA) 60 MG capsule 1 capsule    [provider]  ?Threasa Heads

## 2021-07-30 NOTE — ED Notes (Signed)
Intensivest at bedside at this time 

## 2021-07-30 NOTE — Assessment & Plan Note (Addendum)
-   Continue antibiotics, fluids ? ?

## 2021-07-30 NOTE — Assessment & Plan Note (Signed)
Patient still appears significantly volume contracted after 3 L suggesting greater chronicity to poor intake.  ?- Suspect progression of underlying CKD has led to poor oral intake from uremia with downward spiral in renal function.  ?

## 2021-07-30 NOTE — Assessment & Plan Note (Deleted)
(  please see severe sepsis) 

## 2021-07-30 NOTE — Progress Notes (Deleted)
?   07/30/21 1316  ?Assess: MEWS Score  ?Temp 97.7 ?F (36.5 ?C)  ?BP 91/62  ?Pulse Rate (!) 106  ?Resp 18  ?SpO2 98 %  ?O2 Device Room Air  ?Assess: MEWS Score  ?MEWS Temp 0  ?MEWS Systolic 1  ?MEWS Pulse 1  ?MEWS RR 0  ?MEWS LOC 0  ?MEWS Score 2  ?MEWS Score Color Yellow  ?Treat  ?Pain Scale 0-10  ?Pain Score 0  ?Take Vital Signs  ?Increase Vital Sign Frequency  Yellow: Q 2hr X 2 then Q 4hr X 2, if remains yellow, continue Q 4hrs  ?Escalate  ?MEWS: Escalate Yellow: discuss with charge nurse/RN and consider discussing with provider and RRT  ?Notify: Charge Nurse/RN  ?Name of Charge Nurse/RN Notified Shanda Bumps, RN  ?Date Charge Nurse/RN Notified 07/30/21  ?Time Charge Nurse/RN Notified 1330  ?Assess: SIRS CRITERIA  ?SIRS Temperature  0  ?SIRS Pulse 1  ?SIRS Respirations  0  ?SIRS WBC 0  ?SIRS Score Sum  1  ? ? ?

## 2021-07-30 NOTE — Consult Note (Signed)
? ?NAME:  Noah Adkins, MRN:  [id number], DOB:  [birth year], LOS: 0 ?ADMISSION DATE:  07/29/2021, CONSULTATION DATE:  07/30/2021 ?REFERRING MD:  Alvino Chapel - ED [facility], CHIEF COMPLAINT:  hypotension.   ? ?History of Present Illness:  ?88 year old man with stage IV CKD by history.   ? ?3 days of increasing lethargy and poor oral intake.  ? ?Found to be hypotensive in ED. Creatinine of >7. Still hypotensive after 3 L so CCM called.  ? ?Pertinent  Medical History  ? ?Past Medical History:  ?Diagnosis Date  ? Arthritis   ? "knees, hands" (10/10/2015)  ? History of blood transfusion 1970s  ? "related to work related injury; broke rib; lost blood"  ? History of gout   ? Hypertension   ? Kidney stones   ? Presence of permanent cardiac pacemaker   ? Type II diabetes mellitus ([place])   ? ? ? ?Significant Hospital Events: ?Including procedures, antibiotic start and stop dates in addition to other pertinent events   ?N/A ? ?Interim History / Subjective:  ?Patient unable to answer questions.  ? ?Objective   ?Blood pressure (!) 105/47, pulse 73, temperature (!) 97.2 ?F (36.2 ?C), temperature source Oral, resp. rate (!) 21, SpO2 98 %. ?   ?   ? ?Intake/Output Summary (Last 24 hours) at 07/30/2021 0038 ?Last data filed at 07/30/2021 0005 ?Gross per 24 hour  ?Intake 2597.49 ml  ?Output --  ?Net 2597.49 ml  ? ?There were no vitals filed for this visit. ? ?Examination: ?General: frail, calm but confused.  ?HENT: dry oral mucosae ?Lungs: clear  ?Cardiovascular: JVP is flat, HS normal.  ?Abdomen: left flank tenderness.  ?Extremities: +++ skin tenting  ?Neuro: nil focal  ?GU: minimal urine output ? ?Resolved Hospital Problem list   ?N/A ? ?Assessment & Plan:  ?AKI (acute kidney injury) ([place]) ?Severe renal dysfunction. Likely not all acute and unlikely to resolve without hemodialysis.  ?End-stage renal disease likely to be progressive and patient HD candidate given age, frailty.  ? ?- Discussed with family. They acknowledge that he is been declining  and that he would not want to be dependent.  ?- Patient has expressed to them that he doesn't want extraordinary measures. ?- Discussed that we can try gentle hydration, but that admission to ICU, vasopressors, or life support of any form would not likely be successful and would likely prolong suffering.  ?- DNI/DNR. ?- Admit to Hospitalist. Trial of fluids and antibiotics and transition to comfort care if this fails.  ? ?Hypovolemic shock ([place]) ?Patient still appears significantly volume contracted after 3 L suggesting greater chronicity to poor intake.  ?- Suspect progression of underlying CKD has led to poor oral intake from uremia with downward spiral in renal function.  ? ?CKD (chronic kidney disease), stage III ([place]) ? ?Malnutrition of moderate degree ([place]: 60% to less than 75% of standard weight) (Home) ? ?Pacemaker ? ?Dementia ([place]) ? ? ?Best Practice (right click and "Reselect all SmartList Selections" daily)  ? ?Diet/type: clear liquids ?DVT prophylaxis: not indicated ?GI prophylaxis: N/A ?Lines: N/A ?Foley:  Yes, and it is still needed ?Code Status:  DNR ?Last date of multidisciplinary goals of care discussion [Discussion with family outlined above] ? ?Labs   ?CBC: ?Recent Labs  ?Lab 07/29/21 ?1908  ?WBC 10.3  ?NEUTROABS 8.4*  ?HGB 12.4*  ?HCT 36.6*  ?MCV 96.1  ?PLT 277  ? ? ?Basic Metabolic Panel: ?Recent Labs  ?Lab 07/29/21 ?2233  ?NA 139  ?K  5.6*  ?CL 110  ?CO2 17*  ?GLUCOSE 142*  ?BUN 155*  ?CREATININE 7.12*  ?CALCIUM 8.7*  ? ?GFR: ?CrCl cannot be calculated (Unknown ideal weight.). ?Recent Labs  ?Lab 07/29/21 ?1908 07/29/21 ?2110 07/29/21 ?2205  ?WBC 10.3  --   --   ?LATICACIDVEN  --  1.5 1.4  ? ? ?Liver Function Tests: ?Recent Labs  ?Lab 07/29/21 ?2233  ?AST 15  ?ALT 9  ?ALKPHOS 49  ?BILITOT 0.6  ?PROT 5.4*  ?ALBUMIN 2.9*  ? ?No results for input(s): LIPASE, AMYLASE in the last 168 hours. ?No results for input(s): AMMONIA in the last 168 hours. ? ?ABG ?No results found for: PHART, PCO2ART, PO2ART,  HCO3, TCO2, ACIDBASEDEF, O2SAT  ? ?Coagulation Profile: ?No results for input(s): INR, PROTIME in the last 168 hours. ? ?Cardiac Enzymes: ?Recent Labs  ?Lab 07/29/21 ?2233  ?CKTOTAL 238  ? ? ?HbA1C: ?Hemoglobin A1C  ?Date/Time Value Ref Range Status  ?03/14/2020 12:00 AM 6.4  Final  ? ?Hgb A1c MFr Bld  ?Date/Time Value Ref Range Status  ?03/02/2020 08:03 AM 6.4 (H) 4.8 - 5.6 % Final  ?  Comment:  ?  (NOTE) ?Pre diabetes:          5.7%-6.4% ? ?Diabetes:              >6.4% ? ?Glycemic control for   <7.0% ?adults with diabetes ?  ?04/11/2017 07:45 AM 7.3 (H) 4.8 - 5.6 % Final  ?  Comment:  ?  (NOTE) ?Pre diabetes:          5.7%-6.4% ?Diabetes:              >6.4% ?Glycemic control for   <7.0% ?adults with diabetes ?  ? ? ?CBG: ?No results for input(s): GLUCAP in the last 168 hours. ? ?Kipp Brood, MD FRCPC ?ICU Physician ?[facility]  ?Pager: [phone] ?Or Epic Secure Chat ?After hours: [phone]. ? ?07/30/2021, 12:59 AM ? ? ? ? ? ? ? ?

## 2021-07-30 NOTE — Progress Notes (Signed)
?   07/30/21 1316  ?Assess: MEWS Score  ?Temp 97.7 ?F (36.5 ?C)  ?BP 91/62  ?Pulse Rate (!) 106  ?Resp 18  ?SpO2 98 %  ?O2 Device Room Air  ?Assess: MEWS Score  ?MEWS Temp 0  ?MEWS Systolic 1  ?MEWS Pulse 1  ?MEWS RR 0  ?MEWS LOC 0  ?MEWS Score 2  ?MEWS Score Color Yellow  ?Assess: if the MEWS score is Yellow or Red  ?Were vital signs taken at a resting state? Yes  ?Focused Assessment No change from prior assessment  ?Does the patient meet 2 or more of the SIRS criteria? No  ?MEWS guidelines implemented *See Row Information* Yes  ?Treat  ?Pain Scale 0-10  ?Pain Score 0  ?Take Vital Signs  ?Increase Vital Sign Frequency  Yellow: Q 2hr X 2 then Q 4hr X 2, if remains yellow, continue Q 4hrs  ?Escalate  ?MEWS: Escalate Yellow: discuss with charge nurse/RN and consider discussing with provider and RRT  ?Notify: Charge Nurse/RN  ?Name of Charge Nurse/RN Notified Janett Billow, RN  ?Date Charge Nurse/RN Notified 07/30/21  ?Time Charge Nurse/RN Notified 1330  ?Assess: SIRS CRITERIA  ?SIRS Temperature  0  ?SIRS Pulse 1  ?SIRS Respirations  0  ?SIRS WBC 0  ?SIRS Score Sum  1  ? ? ?

## 2021-07-30 NOTE — Assessment & Plan Note (Signed)
? ? ?#)  hypotension: In the context of a documented history of essential hypertension, initial systolic blood pressures in the 70s, which is subsequently improved into the low 100s following greater than 30 mL/kg IVF bolus as well as IV Rocephin.  Suspect that the patient's hypotension was multifactorial in etiology, with contributions from dehydration given significant decline in oral intake over the course the last week, as well as potential contribution from severe sepsis due to UTI, as above.  Potential additional pharmacologic contributions not only from outpatient antihypertensive medications that include lisinopril and HCTZ, but pharmacologic impact of oxycodone, particularly in the setting of diminished renal clearance and sequela I of nephrotoxic metabolites thereof in this setting.  ACS appears to be less likely, including EKG, which is no evidence of acute ischemic changes, and without significant change relative to most recent prior EKG from December 2021. ? ?Critical care was consulted in the ED and evaluated the patient in person, along with discussions with the patient's wife and son, following which wife and son have conveyed that the patient would not want ICU admission nor would he want pressor therapy.  Rather, wife/son wish to proceed with IV fluids and IV antibiotics, as further detailed above. ? ? ?Plan: Continuous LR, as above.  Monitor strict I's and O's and daily weights.  Further evaluation management of severe sepsis due to UTI, including continuation of Rocephin, as above.  Hold home lisinopril, HCTZ, oxycodone.  Repeat CMP and CBC in the morning. ? ? ? ?

## 2021-07-30 NOTE — ED Notes (Signed)
Juice given, tolerated well. ?

## 2021-07-30 NOTE — IPAL (Signed)
?  Interdisciplinary Goals of Care Family Meeting ? ? ?Date carried out: 07/30/2021 ? ?Location of the meeting: Bedside ? ?Member's involved: Physician and Family Member or next of kin ? ?Durable Power of Attorney or acting medical decision maker: Carder Yin, spouse   ? ?Discussion: We discussed goals of care for [employer] .  Poor prognosis given age and severity of renal failure. Patient had been declining and had not wanted extraordinary measures.  ? ?Code status: Full DNR , trial of fluids and antibiotics only  ? ?Disposition: Continue current acute care possible transition to comfort care this admission.  ? ?Time spent for the meeting: 10 min. ? ? ? ?Lynnell Catalan, MD ? ?07/30/2021, 12:59 AM ? ? ?

## 2021-07-30 NOTE — Assessment & Plan Note (Addendum)
BUN still >140, Cr still 6.7 mg/dL.  ?- Continue IV fluids, trend Cr  ? ?

## 2021-07-30 NOTE — Assessment & Plan Note (Signed)
Severe renal dysfunction. Likely not all acute and unlikely to resolve without hemodialysis.  ?End-stage renal disease likely to be progressive and patient HD candidate given age, frailty.  ? ?- Discussed with family. They acknowledge that he is been declining and that he would not want to be dependent.  ?- Patient has expressed to them that he doesn't want extraordinary measures. ?- Discussed that we can try gentle hydration, but that admission to ICU, vasopressors, or life support of any form would not likely be successful and would likely prolong suffering.  ?- DNI/DNR. ?- Admit to Hospitalist. Trial of fluids and antibiotics and transition to comfort care if this fails.  ?

## 2021-07-30 NOTE — Progress Notes (Signed)
PHARMACY - PHYSICIAN COMMUNICATION ?CRITICAL VALUE ALERT - BLOOD CULTURE IDENTIFICATION (BCID) ? ?Noah Adkins is an 88 y.o. male who presented to [facility] on 07/29/2021 with a chief complaint of decreased responsiveness over the last week ? ?Assessment:  GPC, aerobic, 1/4, staph species ? ?Name of physician (or Provider) ContactedToniann Fail ? ?Current antibiotics: CTX ? ?Changes to prescribed antibiotics recommended:  ?None, probable contaminant ? ?Results for orders placed or performed during the hospital encounter of 07/29/21  ?Blood Culture ID Panel (Reflexed) (Collected: 07/29/2021  8:40 PM)  ?Result Value Ref Range  ? Enterococcus faecalis NOT DETECTED NOT DETECTED  ? Enterococcus Faecium NOT DETECTED NOT DETECTED  ? Listeria monocytogenes NOT DETECTED NOT DETECTED  ? Staphylococcus species DETECTED (A) NOT DETECTED  ? Staphylococcus aureus (BCID) NOT DETECTED NOT DETECTED  ? Staphylococcus epidermidis NOT DETECTED NOT DETECTED  ? Staphylococcus lugdunensis NOT DETECTED NOT DETECTED  ? Streptococcus species NOT DETECTED NOT DETECTED  ? Streptococcus agalactiae NOT DETECTED NOT DETECTED  ? Streptococcus pneumoniae NOT DETECTED NOT DETECTED  ? Streptococcus pyogenes NOT DETECTED NOT DETECTED  ? A.calcoaceticus-baumannii NOT DETECTED NOT DETECTED  ? Bacteroides fragilis NOT DETECTED NOT DETECTED  ? Enterobacterales NOT DETECTED NOT DETECTED  ? Enterobacter cloacae complex NOT DETECTED NOT DETECTED  ? Escherichia coli NOT DETECTED NOT DETECTED  ? Klebsiella aerogenes NOT DETECTED NOT DETECTED  ? Klebsiella oxytoca NOT DETECTED NOT DETECTED  ? Klebsiella pneumoniae NOT DETECTED NOT DETECTED  ? Proteus species NOT DETECTED NOT DETECTED  ? Salmonella species NOT DETECTED NOT DETECTED  ? Serratia marcescens NOT DETECTED NOT DETECTED  ? Haemophilus influenzae NOT DETECTED NOT DETECTED  ? Neisseria meningitidis NOT DETECTED NOT DETECTED  ? Pseudomonas aeruginosa NOT DETECTED NOT DETECTED  ? Stenotrophomonas  maltophilia NOT DETECTED NOT DETECTED  ? Candida albicans NOT DETECTED NOT DETECTED  ? Candida auris NOT DETECTED NOT DETECTED  ? Candida glabrata NOT DETECTED NOT DETECTED  ? Candida krusei NOT DETECTED NOT DETECTED  ? Candida parapsilosis NOT DETECTED NOT DETECTED  ? Candida tropicalis NOT DETECTED NOT DETECTED  ? Cryptococcus neoformans/gattii NOT DETECTED NOT DETECTED  ? ? ?Arley Phenix RPh ?07/30/2021, 11:42 PM ? ?

## 2021-07-31 DIAGNOSIS — N1832 Chronic kidney disease, stage 3b: Secondary | ICD-10-CM | POA: Diagnosis not present

## 2021-07-31 DIAGNOSIS — N179 Acute kidney failure, unspecified: Secondary | ICD-10-CM | POA: Diagnosis not present

## 2021-07-31 DIAGNOSIS — L8990 Pressure ulcer of unspecified site, unspecified stage: Secondary | ICD-10-CM | POA: Insufficient documentation

## 2021-07-31 LAB — BASIC METABOLIC PANEL
Anion gap: 13 (ref 5–15)
BUN: 105 mg/dL — ABNORMAL HIGH (ref 8–23)
CO2: 14 mmol/L — ABNORMAL LOW (ref 22–32)
Calcium: 8.6 mg/dL — ABNORMAL LOW (ref 8.9–10.3)
Chloride: 114 mmol/L — ABNORMAL HIGH (ref 98–111)
Creatinine, Ser: 4.29 mg/dL — ABNORMAL HIGH (ref 0.61–1.24)
GFR, Estimated: 12 mL/min — ABNORMAL LOW (ref >60)
Glucose, Bld: 69 mg/dL — ABNORMAL LOW (ref 70–99)
Potassium: 4.9 mmol/L (ref 3.5–5.1)
Sodium: 141 mmol/L (ref 135–145)

## 2021-07-31 LAB — CBC
HCT: 30 % — ABNORMAL LOW (ref 39.0–52.0)
Hemoglobin: 10 g/dL — ABNORMAL LOW (ref 13.0–17.0)
MCH: 31.9 pg (ref 26.0–34.0)
MCHC: 33.3 g/dL (ref 30.0–36.0)
MCV: 95.8 fL (ref 80.0–100.0)
Platelets: 237 10*3/uL (ref 150–400)
RBC: 3.13 MIL/uL — ABNORMAL LOW (ref 4.22–5.81)
RDW: 16.3 % — ABNORMAL HIGH (ref 11.5–15.5)
WBC: 8.3 10*3/uL (ref 4.0–10.5)
nRBC: 0 % (ref 0.0–0.2)

## 2021-07-31 LAB — URINE CULTURE: Culture: NO GROWTH

## 2021-07-31 LAB — GLUCOSE, CAPILLARY
Glucose-Capillary: 125 mg/dL — ABNORMAL HIGH (ref 70–99)
Glucose-Capillary: 125 mg/dL — ABNORMAL HIGH (ref 70–99)
Glucose-Capillary: 78 mg/dL (ref 70–99)
Glucose-Capillary: 83 mg/dL (ref 70–99)

## 2021-07-31 LAB — LACTIC ACID, PLASMA: Lactic Acid, Venous: 1.2 mmol/L (ref 0.5–1.9)

## 2021-07-31 MED ORDER — PANTOPRAZOLE SODIUM 40 MG PO TBEC
40.0000 mg | DELAYED_RELEASE_TABLET | Freq: Every day | ORAL | Status: DC
  Administered 2021-07-31 – 2021-08-03 (×4): 40 mg via ORAL
  Filled 2021-07-31 (×4): qty 1

## 2021-07-31 MED ORDER — DULOXETINE HCL 60 MG PO CPEP
60.0000 mg | ORAL_CAPSULE | Freq: Every day | ORAL | Status: DC
  Administered 2021-07-31 – 2021-08-03 (×4): 60 mg via ORAL
  Filled 2021-07-31 (×4): qty 1

## 2021-07-31 MED ORDER — ADULT MULTIVITAMIN W/MINERALS CH
1.0000 | ORAL_TABLET | Freq: Every day | ORAL | Status: DC
  Administered 2021-07-31 – 2021-08-03 (×4): 1 via ORAL
  Filled 2021-07-31 (×4): qty 1

## 2021-07-31 MED ORDER — OXYCODONE-ACETAMINOPHEN 5-325 MG PO TABS
1.0000 | ORAL_TABLET | Freq: Two times a day (BID) | ORAL | Status: DC | PRN
  Administered 2021-07-31 – 2021-08-01 (×3): 1 via ORAL
  Filled 2021-07-31 (×4): qty 1

## 2021-07-31 MED ORDER — MEMANTINE HCL 10 MG PO TABS
10.0000 mg | ORAL_TABLET | Freq: Every day | ORAL | Status: DC
Start: 2021-07-31 — End: 2021-08-03
  Administered 2021-07-31 – 2021-08-03 (×4): 10 mg via ORAL
  Filled 2021-07-31 (×4): qty 1

## 2021-07-31 MED ORDER — DONEPEZIL HCL 10 MG PO TABS
10.0000 mg | ORAL_TABLET | Freq: Every day | ORAL | Status: DC
  Administered 2021-07-31 – 2021-08-03 (×4): 10 mg via ORAL
  Filled 2021-07-31 (×4): qty 1

## 2021-07-31 MED ORDER — BOOST / RESOURCE BREEZE PO LIQD CUSTOM
1.0000 | ORAL | Status: DC
  Administered 2021-07-31 – 2021-08-02 (×3): 1 via ORAL

## 2021-07-31 NOTE — Progress Notes (Signed)
Initial Nutrition Assessment ? ?DOCUMENTATION CODES:  ? ?Not applicable ? ?INTERVENTION:  ?- continue Ensure Plus High Protein BID, each supplement provides 350 kcal and 20 grams of protein. ?- will order Boost Breeze once/day, each supplement provides 250 kcal and 9 grams of protein ?- will order 1 tablet multivitamin with minerals daily ? ? ? ?NUTRITION DIAGNOSIS:  ? ?Increased nutrient needs related to acute illness as evidenced by estimated needs. ? ?GOAL:  ? ?Patient will meet greater than or equal to 90% of their needs ? ?MONITOR:  ? ?PO intake, Supplement acceptance, Labs, Weight trends ? ?REASON FOR ASSESSMENT:  ? ?Malnutrition Screening Tool, Consult ?Assessment of nutrition requirement/status ? ?ASSESSMENT:  ? ?88 y.o. male with medical history of dementia, HTN, type 2 DM, pacemaker placement in 2017, GERD, urinary incontinence, and stage CKD. He presented to the ED and was diagnosed with ARF superimposed on CKD; he had presented due to AMS which worsened in the week PTA. ? ?Patient noted to be a/o to self only. Patient sleeping at the time of RD visit; no visitors present at that time. He did briefly wake up at the end of NFPE but was very minimally vocal and quickly went back to sleep. ? ?He has not been seen by a [place] RD at any time in the past.  ? ?Weight today is 142 lb and PTA the most recently documented weight was 150 lb on 04/03/2020. ? ?Palliative Care NP note in from a short time ago indicates inability to reach family by phone. ? ?Flow sheet documentation indicates that he ate 0% of breakfast today. Ensure on bedside table and 75% consumed. ? ? ?Labs reviewed; CBGs: 83, 78, 125 mg/dl, Cl: 114 mmol/l, BUN: 105 mg/dl, creatinine: 4.29 mg/dl, Ca: 8.6 mg/dl, GFR: 12 ml/min. ? ?Medications reviewed; sliding scale novolog, 40 mg oral protonix/day.  ? ?IVF; LR @ 100 ml/hr. ?  ? ?NUTRITION - FOCUSED PHYSICAL EXAM: ? ?Flowsheet Row Most Recent Value  ?Orbital Region No depletion  ?Upper Arm Region  Mild depletion  ?Thoracic and Lumbar Region Unable to assess  ?Buccal Region No depletion  ?Temple Region No depletion  ?Clavicle Bone Region Mild depletion  ?Clavicle and Acromion Bone Region Mild depletion  ?Scapular Bone Region Unable to assess  ?Dorsal Hand No depletion  ?Patellar Region No depletion  ?Anterior Thigh Region No depletion  ?Posterior Calf Region Unable to assess  ?Edema (RD Assessment) Unable to assess  ?Hair Reviewed  ?Eyes Reviewed  ?Mouth Unable to assess  ?Skin Reviewed  ?Nails Reviewed  ? ?  ? ? ?Diet Order:   ?Diet Order   ? ?       ?  Diet regular Room service appropriate? Yes; Fluid consistency: Thin  Diet effective now       ?  ? ?  ?  ? ?  ? ? ?EDUCATION NEEDS:  ? ?Not appropriate for education at this time ? ?Skin:  Skin Assessment: Skin Integrity Issues: ?Skin Integrity Issues:: Stage I ?Stage I: sacrum ? ?Last BM:  PTA/unknown ? ?Height:  ? ?Ht Readings from Last 1 Encounters:  ?04/20/20 5\' 5"  (1.651 m)  ? ? ?Weight:  ? ?Wt Readings from Last 1 Encounters:  ?07/31/21 64.4 kg  ? ? ? ?BMI:  Body mass index is 23.63 kg/m?. ? ?Estimated Nutritional Needs:  ?Kcal:  1600-1800 kcal ?Protein:  70-85 grams ?Fluid:  >/= 1.8 L/day ? ? ? ? ?Jarome Matin, MS, RD, LDN ?Registered Dietitian II ?Inpatient Clinical Nutrition ?  RD pager # and on-call/weekend pager # available in [place]  ? ?

## 2021-07-31 NOTE — TOC Initial Note (Signed)
Transition of Care (TOC) - Initial/Assessment Note  ? ? ?Patient Details  ?Name: Noah Adkins ?MRN: [id number] ?Date of Birth: Sep 09, 1926 ? ?Transition of Care [facility]) CM/SW Contact:    ?Leeroy Cha, RN ?Phone Number: ?07/31/2021, 10:13 AM ? ?Clinical Narrative:                 ? ?Transition of Care (TOC) Screening Note ? ? ?Patient Details  ?Name: Noah Adkins ?Date of Birth: 02/12/1927 ? ? ?Transition of Care [facility]) CM/SW Contact:    ?Leeroy Cha, RN ?Phone Number: ?07/31/2021, 10:14 AM ? ? ? ?Transition of Care Department [facility]) has reviewed patient and no TOC needs have been identified at this time. We will continue to monitor patient advancement through interdisciplinary progression rounds. If new patient transition needs arise, please place a TOC consult. ? ? ? ?Expected Discharge Plan: Home/Self Care ?Barriers to Discharge: No Barriers Identified ? ? ?Patient Goals and CMS Choice ?Patient states their goals for this hospitalization and ongoing recovery are:: to go hon[place] ?CMS Medicare.gov Compare Post Acute Care list provided to:: Patient ?  ? ?Expected Discharge Plan and Services ?Expected Discharge Plan: Home/Self Care ?  ?Discharge Planning Services: CM Consult ?  ?Living arrangements for the past 2 months: [place] ?                ?  ?  ?  ?  ?  ?  ?  ?  ?  ?  ? ?Prior Living Arrangements/Services ?Living arrangements for the past 2 months: [place] ?Lives with:: Spouse ?Patient language and need for interpreter reviewed:: Yes ?Do you feel safe going back to the place where you live?: Yes      ?Need for Family Participation in Patient Care: Yes (Comment) ([place]aL [place]ARD[place]) ?Care giver support system in place?: Yes (comment) ?  ?Criminal Activity/Legal Involvement Pertinent to Current Situation/Hospitalization: No - Comment as needed ? ?Activities of Daily Living ?  ?ADL Screening (condition at time of admission) ?Patient's cognitive ability adequate to safely complete daily  activities?: Yes ?Is the patient deaf or have difficulty hearing?: Yes ?Does the patient have difficulty seeing, even when wearing glasses/contacts?: Yes ?Does the patient have difficulty concentrating, remembering, or making decisions?: Yes ?Patient able to express need for assistance with ADLs?: Yes ?Does the patient have difficulty dressing or bathing?: Yes ?Independently performs ADLs?: No ?Communication: Independent ?Dressing (OT): Appropriate for developmental age ?Grooming: Needs assistance ?Is this a change from baseline?: Pre-admission baseline ?Feeding: Needs assistance ?Is this a change from baseline?: Pre-admission baseline ?Bathing: Dependent ?Is this a change from baseline?: Pre-admission baseline ?Toileting: Dependent ?Is this a change from baseline?: Pre-admission baseline ?In/Out Bed: Dependent ?Is this a change from baseline?: Pre-admission baseline ?Walks in Home: Needs assistance ?Is this a change from baseline?: Pre-admission baseline ?Does the patient have difficulty walking or climbing stairs?: Yes ?Weakness of Legs: Both ?Weakness of Arms/Hands: Both ? ?Permission Sought/Granted ?  ?  ?   ?   ?   ?   ? ?Emotional Assessment ?Appearance:: Appears stated age ?  ?  ?Orientation: : Oriented to Self, Oriented to Place, Oriented to  Time, Oriented to Situation ?Alcohol / Substance Use: Not Applicable ?Psych Involvement: No (comment) ? ?Admission diagnosis:  Dehydration [E86.0] ?Hypovolemic shock ([place]) [R57.1] ?ARF (acute renal failure) ([place]) [N17.9] ?AKI (acute kidney injury) ([place]) [N17.9] ?Hypotension, unspecified hypotension type [I95.9] ?Patient Active Problem List  ? Diagnosis Date Noted  ? Pressure injury of  skin 07/31/2021  ? Acute renal failure superimposed on stage 3b chronic kidney disease ([place]) 07/30/2021  ? Hypovolemic shock ([place]) 07/30/2021  ? Severe sepsis ([place]) 07/30/2021  ? UTI (urinary tract infection) 07/30/2021  ? Hyperkalemia 07/30/2021  ? Acute metabolic encephalopathy  [id number]  ? Allergic rhinitis 09/05/2020  ? Anemia in chronic kidney disease 09/05/2020  ? Arthritis of right hand 09/05/2020  ? Chronic pain syndrome 09/05/2020  ? Complete atrioventricular block ([place]) 09/05/2020  ? Dementia ([place]) 09/05/2020  ? Diabetic renal disease ([place]) 09/05/2020  ? Diabetic retinopathy associated with type 2 diabetes mellitus ([place]) 09/05/2020  ? Drug-induced constipation 09/05/2020  ? Dysphagia following cerebrovascular accident (CVA) 09/05/2020  ? Gout 09/05/2020  ? Hardening of the aorta (main artery of the heart) ([place]) 09/05/2020  ? Insomnia 09/05/2020  ? Leukocytosis 09/05/2020  ? Localized, primary osteoarthritis of hand 09/05/2020  ? Malnutrition of moderate degree [place]: 60% to less than 75% of standard weight) ([place]) 09/05/2020  ? Memory loss 09/05/2020  ? Major depression single episode, in partial remission ([place]) 09/05/2020  ? Osteoarthritis of hands, bilateral 09/05/2020  ? Osteoarthritis of knee 09/05/2020  ? Overactive bladder 09/05/2020  ? Pure hypercholesterolemia 09/05/2020  ? Sick sinus syndrome ([place]) 09/05/2020  ? Vitamin D deficiency 09/05/2020  ? Closed intertrochanteric fracture of right femur ([place]) 04/02/2020  ? History of gout 03/13/2020  ? Closed hip fracture requiring operative repair, sequela 03/02/2020  ? Essential hypertension 03/02/2020  ? Osteoporosis 03/02/2020  ? Hypokalemia 03/02/2020  ? Hypocalcemia 03/02/2020  ? CKD (chronic kidney disease), stage III ([place]) 03/02/2020  ? Pacemaker 03/02/2020  ? Type 2 diabetes mellitus with chronic kidney disease, without long-term current use of insulin ([place]) 03/02/2020  ? TIA (transient ischemic attack) 04/10/2017  ? Symptomatic bradycardia 10/10/2015  ? RBBB 10/30/2013  ? ?PCP:  Donald Prose, MD ?Pharmacy:   ?[facility]- [address] [place], [place] - [address] ?[address] ?[place] [address] ?Phone: [phone] Fax: [phone] ? ?[place] [address] ?Phone: [phone] Fax: [phone] ? ? ? ? ?Social Determinants of Health (SDOH) Interventions ?  ? ?Readmission Risk Interventions ?   ? View : No data to display.  ?  ?  ?  ? ? ? ?

## 2021-07-31 NOTE — Progress Notes (Signed)
Palliative: ? ?Chart reviewed extensively. Met with patient at bedside - unable to participate in goals of care d/t confusion - oriented to place and self but not situation. No family at bedside (checked multiple times throughout day). Call to only number in demographics with no answer - voicemail left with call back number. ? ?Juel Burrow, DNP, AGNP-C ?Palliative Medicine Team ?Team Phone # [phone]  ?Pager # [phone] ? ?NO CHARGE ?

## 2021-07-31 NOTE — Progress Notes (Signed)
?   07/30/21 2208  ?Vitals  ?Temp 99 ?F (37.2 ?C)  ?Temp Source Oral  ?BP (!) 87/35  ?MAP (mmHg) (!) 50  ?BP Location Right Arm  ?BP Method Automatic  ?Patient Position (if appropriate) Lying  ?Pulse Rate (!) 109  ?Pulse Rate Source Monitor  ?Resp 20  ?MEWS COLOR  ?MEWS Score Color Yellow  ?Oxygen Therapy  ?SpO2 97 %  ?O2 Device Room Air  ?MEWS Score  ?MEWS Temp 0  ?MEWS Systolic 1  ?MEWS Pulse 1  ?MEWS RR 0  ?MEWS LOC 0  ?MEWS Score 2  ?Provider Notification  ?Provider Name/Title Hal Hope  ?Date Provider Notified 07/30/21  ?Time Provider Notified 2335  ?Method of Notification Call  ?Notification Reason Other (Comment) ?(low BP)  ?Provider response See new orders  ?Date of Provider Response 07/30/21  ?Time of Provider Response 2336  ? ?New orders for 500 ml bolus LR carried out. BP now 90/69 (map 77) HR 94. ?

## 2021-07-31 NOTE — Progress Notes (Signed)
?PROGRESS NOTE ?Noah Adkins  [id number] DOB: 1926/09/21 DOA: 07/29/2021 ?PCP: Donald Prose, MD  ? ?Brief Narrative/Hospital Course: ?88 y.o. M with DM, chronic kidney disease who presented with slowly progressive decreased responsiveness over the last week.In the ER, he was hypotensive, started on IV fluids.  Critical care evaluated patient and discussed with family, who felt patient would not wish for pressors, intubation.   ? And was admited to medicine with IV fluids, IV antibiotics and PMT eval  ?  ?Subjective: ?Seen and examined this morning son at the bedside.  Patient was sleeping, able to wake up recognize son, was able to talk well, has baseline dementia son thinks he has returned to his baseline. ?On room air , he is not in distress, pleasant. ? ?Assessment and Plan: ?Principal Problem: ?  Acute renal failure superimposed on stage 3b chronic kidney disease ([place]) ?Active Problems: ?  Essential hypertension ?  Pacemaker ?  Type 2 diabetes mellitus with chronic kidney disease, without long-term current use of insulin ([place]) ?  Anemia in chronic kidney disease ?  Dementia ([place]) ?  Malnutrition of moderate degree ([place]: 60% to less than 75% of standard weight) ([place]) ?  Sick sinus syndrome ([place]) ?  Hypovolemic shock ([place]) ?  Severe sepsis ([place]) ?  UTI (urinary tract infection) ?  Hyperkalemia ?  Acute metabolic encephalopathy ?  Pressure injury of skin ?  ?Acute renal failure superimposed on stage 3b chronic kidney disease: ?Baseline bun/creat:68/1.9 (04/20/20),on admission 155/7.1-and renal function improving on IV fluid hydration.  We will continue the same.  Palliative care consulted. ?Recent Labs  ?Lab 07/29/21 ?2233 07/30/21 ?0350 07/31/21 ?[id number]  ?BUN 155* 148* 105*  ?CREATININE 7.12* 6.70* 4.29*  ? ?Metabolic acidosis hyperchloremic: bicarb 14, needs IV fluid hydration for his AKI, anion gap is normal, monitor bicarb if further downtrending-change 0.45 NS. ? ?Type 2 diabetes mellitus with chronic kidney  disease, without long-term current use of insulin ?Hypoglycemia blood sugar improved.  Monitor continue regular diet as tolerated ?Recent Labs  ?Lab 07/30/21 ?1320 07/30/21 ?1903 07/31/21 ?[id number] 07/31/21 ?[id number] 07/31/21 ?1137  ?GLUCAP 95 95 83 78 125*  ?  ?Hypotension on admission ?Hypovolemic shock ?Essential hypertension: ?Hypotension likely from volume depletion.Blood pressure has stabilized.  Continue IV fluid hydration, holding antihypertensives.  No critical care admission, was seen by critical care. ? ?Staph capitis bacteremia in a single set of blood culture likely contamination.  No leukocytosis or fever.  ? ?Severe sepsis on admission with hypotension likely source UTI ?UTI: ?continue ceftriaxone, follow-up culture data. ? ?Hyperkalemia due to [id number].  Monitor ? ?Acute metabolic encephalopathy ?Dementia: ?Mentation worse on admission-likely in the setting of uremia, AKI, UTI now better.  Does have baseline dementia continue his Aricept Cymbalta [place].  Fall precaution supportive care and delirium precaution to continue. ? ?Sick sinus syndrome w/ Pacemaker ? ?Anemia in chronic kidney disease-monitor ? ?Malnutrition of moderate degree: Augment diet as tolerated.  Dietitian consulted ? ?Goals of care: Palliative care consulted.  Patient is DNR, son at the bedside reports he is waiting to discuss about palliative care/hospice. ?  ?Pressure injury of sacrum stage I POA see below ?Pressure Injury 07/30/21 Sacrum Medial Stage 1 -  Intact skin with non-blanchable redness of a localized area usually over a bony prominence. (Active)  ?07/30/21 1400  ?Location: Sacrum  ?Location Orientation: Medial  ?Staging: Stage 1 -  Intact skin with non-blanchable redness of a localized area usually over a bony prominence.  ?Wound Description (Comments):   ?  Present on Admission: Yes  ? ? ?DVT prophylaxis: SCDs Start: 07/30/21 0051 ?Code Status:   Code Status: DNR ?Family Communication: plan of care discussed with  patient/son at bedside. ?Patient status is: inpatient because of AKI and GOC discussion ?Level of care: Progressive  ? ?Dispo: The patient is from: snf ?           Anticipated disposition: tbd ? ?Mobility Assessment (last 72 hours)   ? ? Mobility Assessment   ? ? [place] Name 07/31/21 1021  ?  ?  ?  ?  ? Does patient have an order for bedrest or is patient medically unstable No - Continue assessment      ? What is the highest level of mobility based on the progressive mobility assessment? Level 1 (Bedfast) - Unable to balance while sitting on edge of bed      ? Is the above level different from baseline mobility prior to current illness? Yes - Recommend PT order      ? ?  ?  ? ?  ?  ? ?Objective: ?Vitals last 24 hrs: ?Vitals:  ? 07/30/21 1603 07/30/21 2208 07/31/21 0045 07/31/21 0500  ?BP: 99/62 (!) 87/35 90/69   ?Pulse: 89 (!) 109 94   ?Resp: 15 20 17    ?Temp: 98.1 ?F (36.7 ?C) 99 ?F (37.2 ?C) 98.3 ?F (36.8 ?C)   ?TempSrc: Oral Oral Oral   ?SpO2: 99% 97% 98%   ?Weight:    64.4 kg  ? ?Weight change:  ? ?Physical Examination: ?General exam: alert awake, oriented to self, people,elderly.  ?HEENT:Oral mucosa DRY, Ear/Nose WNL grossly, dentition normal. ?Respiratory system: bilaterally diminished BS, no use of accessory muscle ?Cardiovascular system: S1 & S2 +, No JVD. ?Gastrointestinal system: Abdomen soft,NT,ND, BS+ ?Nervous System:Alert, awake, moving extremities and grossly nonfocal ?Extremities: LE edema none,distal peripheral pulses palpable.  ?Skin: No rashes,no icterus. ?MSK: Normal muscle bulk,tone, power ? ?Medications reviewed:  ?Scheduled Meds: ? donepezil  10 mg Oral Daily  ? DULoxetine  60 mg Oral Daily  ? feeding supplement  237 mL Oral BID BM  ? insulin aspart  0-6 Units Subcutaneous Q6H  ? memantine  10 mg Oral Daily  ? pantoprazole  40 mg Oral Daily  ? ?Continuous Infusions: ? cefTRIAXone (ROCEPHIN)  IV 2 g (07/30/21 2134)  ? lactated ringers 100 mL/hr at 07/31/21 0756  ? ?Diet Order   ? ?       ?  Diet  regular Room service appropriate? Yes; Fluid consistency: Thin  Diet effective now       ?  ? ?  ?  ? ?  ? ? ?Intake/Output Summary (Last 24 hours) at 07/31/2021 1143 ?Last data filed at 07/31/2021 1139 ?Gross per 24 hour  ?Intake 2577.34 ml  ?Output 1250 ml  ?Net 1327.34 ml  ? ?Net IO Since Admission: 4,032.38 mL [07/31/21 1143]  ?Wt Readings from Last 3 Encounters:  ?07/31/21 64.4 kg  ?04/20/20 68 kg  ?04/04/20 68 kg  ?  ? ?Unresulted Labs (From admission, onward)  ? ?  Start     Ordered  ? 08/01/21 0500  CBC with Differential/Platelet  Daily,   R     ? 07/31/21 0851  ? 08/01/21 [id number]  Basic metabolic panel  Daily,   R     ? 07/31/21 0851  ? ?  ?  ? ?  ?Data Reviewed: I have personally reviewed following labs and imaging studies ?CBC: ?Recent Labs  ?Lab 2021/08/05 ?1908 07/30/21 ?[id number]  07/30/21 ?[id number] 07/31/21 ?[id number]  ?WBC 10.3 9.6  --  8.3  ?NEUTROABS 8.4* 7.7  --   --   ?HGB 12.4* 10.5* 10.9* 10.0*  ?HCT 36.6* 30.2* 32.0* 30.0*  ?MCV 96.1 95.0  --  95.8  ?PLT 277 255  --  237  ? ?Basic Metabolic Panel: ?Recent Labs  ?Lab 07/29/21 ?2233 07/30/21 ?0350 07/30/21 ?[id number] 07/31/21 ?[id number]  ?NA 139 140 141 141  ?K 5.6* 5.1 5.0 4.9  ?CL 110 108  --  114*  ?CO2 17* 19*  --  14*  ?GLUCOSE 142* 80  --  69*  ?BUN 155* 148*  --  105*  ?CREATININE 7.12* 6.70*  --  4.29*  ?CALCIUM 8.7* 9.0  --  8.6*  ?MG  --  1.9  --   --   ?PHOS  --  5.1*  --   --   ? ?GFR: ?CrCl cannot be calculated (Unknown ideal weight.). ?Liver Function Tests: ?Recent Labs  ?Lab 07/29/21 ?2233 07/30/21 ?0350  ?AST 15 18  ?ALT 9 10  ?ALKPHOS 49 52  ?BILITOT 0.6 0.6  ?PROT 5.4* 5.7*  ?ALBUMIN 2.9* 3.1*  ? ?No results for input(s): LIPASE, AMYLASE in the last 168 hours. ?No results for input(s): AMMONIA in the last 168 hours. ?Coagulation Profile: ?Recent Labs  ?Lab 07/30/21 ?0350  ?INR 1.0  ? ?BNP (last 3 results) ?No results for input(s): PROBNP in the last 8760 hours. ?HbA1C: ?No results for input(s): HGBA1C in the last 72 hours. ?CBG: ?Recent Labs  ?Lab  07/30/21 ?1320 07/30/21 ?1903 07/31/21 ?[id number] 07/31/21 ?[id number] 07/31/21 ?1137  ?GLUCAP 95 95 83 78 125*  ? ?Lipid Profile: ?No results for input(s): CHOL, HDL, LDLCALC, TRIG, CHOLHDL, LDLDIRECT in the last 72 hours. ?Thyroid Fun

## 2021-07-31 NOTE — Progress Notes (Incomplete)
Admit date:07/29/2021 ?Chief compliant: AMS ?49 M w/ h/o dementia, admitted with ARF superimposed on stage IIIb chronic kidney disease, with slowly progressive decreased responsiveness over the last week.In the ER, he was hypotensive, started on IV fluids.  Critical care evaluated patient and discussed with family, who felt patient would not wish for pressors, intubation.   ?Hence admission recommended to Medicine with IV fluids, IV antibiotics and PMT eval  ?Hospital course: Patient admitted to [facility] for acute metabolic encephalopathy, mild hyperkalemia, severe sepsis due to urinary tract infection, and initial hypotension, which improved slightly following IV fluids as well as initiation of IV antibiotic.  ?Palliative consult obtained-Per discussions w/ patient's wife (POA) and son, patient is DNR/DNI; additionally, per these discussions: no pressors, no dialysis, no ICU admission. amendable to IVF's and IV abx, with low threshold for conversion to full comfort care measures if patient subsequently decompensates or if no interval improvement in renal function/mental status. ?5/5: able to recognize son, communicating, has baseline dementia, per son- returned to his baseline.On room air  ? ?Assessment/Plan: ? ?1: Profound AKI on CKD stage 3b: Likely due to FTT, dehydration, hypotension. Baseline renal function 68.1.9-->155/7.1 on presentation, improving with IVF. Goals of care as above per palliative care team discussions ? ?2. Hypovolemia hypotension: POA due to dehydration from poor intake likely. Improving with IVF. Home BP meds on hold ? ?3. Acute metabolic encephalopathy with underlying dementia: POA likely due to #1 and #2. Now improved to baseline ? ?4. UTI/sepsis: suspected on presentation and started On empiric abx. Urine cx however so far negative. Staph capitis bacteremia in a single set of blood culture likely contamination.  No leukocytosis or fever.  ? ?5. Electrolyte abnormalities: Hyperkalemia, Metabolic  acidosis, hyperchloremia: related to #1 and improving with IVF-changed to 1/2NS yesterday ? ?6. Sick sinus syndrome w/ Pacemaker ?  ?7. Anemia in chronic kidney disease- Stable ?  ?8. Malnutrition of moderate degree: Augment diet as tolerated.  Dietitian consulted ?  ?Goals of care: Palliative care consulted.  Patient is DNR, son  to discuss about palliative care/hospice options ?  ?Pressure injury of sacrum stage I POA see below ?Pressure Injury 07/30/21 Sacrum Medial Stage 1 -  Intact skin with non-blanchable redness of a localized area usually over a bony prominence. (Active)  ?07/30/21 1400  ?Location: Sacrum  ?Location Orientation: Medial  ?Staging: Stage 1 -  Intact skin with non-blanchable redness of a localized area usually over a bony prominence.  ?Wound Description (Comments):   ?Present on Admission: Yes  ? ? ?DVT prophylaxis: SCD ?Code Status: DNR ?Family / Patient Communication:  ?Disposition Plan:  Downgrade to RMF. Return to SNF if eating okay with or without hospice ?Status is: Inpatient ?{Inpatient:23812} ?  ?

## 2021-08-01 DIAGNOSIS — Z950 Presence of cardiac pacemaker: Secondary | ICD-10-CM

## 2021-08-01 DIAGNOSIS — I495 Sick sinus syndrome: Secondary | ICD-10-CM

## 2021-08-01 DIAGNOSIS — D631 Anemia in chronic kidney disease: Secondary | ICD-10-CM

## 2021-08-01 DIAGNOSIS — G9341 Metabolic encephalopathy: Secondary | ICD-10-CM | POA: Diagnosis not present

## 2021-08-01 DIAGNOSIS — N179 Acute kidney failure, unspecified: Secondary | ICD-10-CM | POA: Diagnosis not present

## 2021-08-01 DIAGNOSIS — Z7189 Other specified counseling: Secondary | ICD-10-CM | POA: Diagnosis not present

## 2021-08-01 DIAGNOSIS — F0390 Unspecified dementia without behavioral disturbance: Secondary | ICD-10-CM

## 2021-08-01 DIAGNOSIS — E1122 Type 2 diabetes mellitus with diabetic chronic kidney disease: Secondary | ICD-10-CM

## 2021-08-01 DIAGNOSIS — I10 Essential (primary) hypertension: Secondary | ICD-10-CM

## 2021-08-01 DIAGNOSIS — N1832 Chronic kidney disease, stage 3b: Secondary | ICD-10-CM | POA: Diagnosis not present

## 2021-08-01 DIAGNOSIS — L8990 Pressure ulcer of unspecified site, unspecified stage: Secondary | ICD-10-CM

## 2021-08-01 DIAGNOSIS — Z515 Encounter for palliative care: Secondary | ICD-10-CM | POA: Diagnosis not present

## 2021-08-01 DIAGNOSIS — E440 Moderate protein-calorie malnutrition: Secondary | ICD-10-CM

## 2021-08-01 DIAGNOSIS — R571 Hypovolemic shock: Secondary | ICD-10-CM

## 2021-08-01 LAB — CBC WITH DIFFERENTIAL/PLATELET
Abs Immature Granulocytes: 0.04 10*3/uL (ref 0.00–0.07)
Basophils Absolute: 0.1 10*3/uL (ref 0.0–0.1)
Basophils Relative: 1 %
Eosinophils Absolute: 0.5 10*3/uL (ref 0.0–0.5)
Eosinophils Relative: 6 %
HCT: 29.2 % — ABNORMAL LOW (ref 39.0–52.0)
Hemoglobin: 9.4 g/dL — ABNORMAL LOW (ref 13.0–17.0)
Immature Granulocytes: 1 %
Lymphocytes Relative: 18 %
Lymphs Abs: 1.6 10*3/uL (ref 0.7–4.0)
MCH: 31.6 pg (ref 26.0–34.0)
MCHC: 32.2 g/dL (ref 30.0–36.0)
MCV: 98.3 fL (ref 80.0–100.0)
Monocytes Absolute: 0.7 10*3/uL (ref 0.1–1.0)
Monocytes Relative: 7 %
Neutro Abs: 5.9 10*3/uL (ref 1.7–7.7)
Neutrophils Relative %: 67 %
Platelets: 204 10*3/uL (ref 150–400)
RBC: 2.97 MIL/uL — ABNORMAL LOW (ref 4.22–5.81)
RDW: 16.5 % — ABNORMAL HIGH (ref 11.5–15.5)
WBC: 8.8 10*3/uL (ref 4.0–10.5)
nRBC: 0 % (ref 0.0–0.2)

## 2021-08-01 LAB — BASIC METABOLIC PANEL
Anion gap: 12 (ref 5–15)
BUN: 94 mg/dL — ABNORMAL HIGH (ref 8–23)
CO2: 20 mmol/L — ABNORMAL LOW (ref 22–32)
Calcium: 8.7 mg/dL — ABNORMAL LOW (ref 8.9–10.3)
Chloride: 111 mmol/L (ref 98–111)
Creatinine, Ser: 3.06 mg/dL — ABNORMAL HIGH (ref 0.61–1.24)
GFR, Estimated: 18 mL/min — ABNORMAL LOW (ref >60)
Glucose, Bld: 98 mg/dL (ref 70–99)
Potassium: 4.8 mmol/L (ref 3.5–5.1)
Sodium: 143 mmol/L (ref 135–145)

## 2021-08-01 LAB — OCCULT BLOOD X 1 CARD TO LAB, STOOL: Fecal Occult Bld: POSITIVE — AB

## 2021-08-01 LAB — GLUCOSE, CAPILLARY
Glucose-Capillary: 111 mg/dL — ABNORMAL HIGH (ref 70–99)
Glucose-Capillary: 118 mg/dL — ABNORMAL HIGH (ref 70–99)
Glucose-Capillary: 166 mg/dL — ABNORMAL HIGH (ref 70–99)
Glucose-Capillary: 210 mg/dL — ABNORMAL HIGH (ref 70–99)
Glucose-Capillary: 97 mg/dL (ref 70–99)

## 2021-08-01 LAB — CULTURE, BLOOD (ROUTINE X 2)

## 2021-08-01 MED ORDER — HYDROMORPHONE HCL 1 MG/ML IJ SOLN
0.5000 mg | INTRAMUSCULAR | Status: DC | PRN
  Administered 2021-08-01 (×2): 0.5 mg via INTRAVENOUS
  Filled 2021-08-01 (×3): qty 0.5

## 2021-08-01 NOTE — Consult Note (Signed)
? ?                                                                                ?Consultation Note ?Date: 08/01/2021  ? ?Patient Name: Noah Adkins  ?DOB: 06-07-1926  MRN: [id number]  Age / Sex: 88 y.o., male  ?PCP: Donald Prose, MD ?Referring Physician: Guilford Shi, MD ? ?Reason for Consultation: Establishing goals of care ? ?HPI/Patient Profile: 88 y.o. male  with past medical history of DM, CKD, dementia admitted on 07/29/2021 with Acute renal failure and UTI.  Plan has been for gentle medical interventions and reassess after some therapies.  Noted family desire to avoid overly invasive interventions.   ? ?Clinical Assessment and Goals of Care: ?I met today with Noah Adkins and his family, including his wife, son, daughter, and granddaughter.   ? ? We discussed clinical course as well as wishes moving forward in regard to advanced directives.  Reviewed continued decline in his nutrition, cognition, and functional status over the past several months with acute decline a couple of weeks ago.  Discussed short term problems such as UTI and long term problems such as dementia and how these comorbidities relate to these three domains.  Concepts specific to code status and rehospitalization discussed.  We discussed difference between a aggressive medical intervention path and a palliative, comfort focused care path.  Values and goals of care important to patient and family were attempted to be elicited. ?  ?Concept of Hospice and Palliative Care were discussed. ?  ?Questions and concerns addressed.   PMT will continue to support holistically. ?  ?SUMMARY OF RECOMMENDATIONS   ?-DNR/DNI ?-Continue current care.  Family is open to either palliative care or hospice support at home at time of discharge. This will be based upon his continued course and likely prognosis.  Plan to treat this UTI and then assess his overall intake, renal function, etc to see if can determine if home hospice or home palliative seems most  appropriate.  Plan for follow-up meeting tomorrow at which time we will further discuss options for support when he leaves the hospital.  ? ?Code Status/Advance Care Planning: ?DNR ? ? ?Symptom Management:  ?Significant pain in his left arm today.  He takes oxycodone 5 mg at home but this did not have good effect for him.  Plan for trial of Dilaudid 0.5 mg IV as he was having some trouble taking pills earlier today. ? ?Palliative Prophylaxis:  ?Delirium Protocol and Frequent Pain Assessment ? ?Prognosis:  ?Guarded, to be determined ? ?Discharge Planning: To Be Determined  ? ?  ? ?Primary Diagnoses: ?Present on Admission: ? Pacemaker ? Dementia ([place]) ? Malnutrition of moderate degree ([place]: 60% to less than 75% of standard weight) ([place]) ? Acute renal failure superimposed on stage 3b chronic kidney disease ([place]) ? Hypovolemic shock ([place]) ? Severe sepsis ([place]) ? UTI (urinary tract infection) ? Hyperkalemia ? Acute metabolic encephalopathy ? Anemia in chronic kidney disease ? Essential hypertension ? Sick sinus syndrome ([place]) ? ? ?I have reviewed the medical record, interviewed the patient and family, and examined the patient. The following aspects are pertinent. ? ?Past Medical History:  ?Diagnosis Date  ?  Arthritis   ? "knees, hands" (10/10/2015)  ? Dementia ([place])   ? History of blood transfusion 1970s  ? "related to work related injury; broke rib; lost blood"  ? History of gout   ? Hypertension   ? Kidney stones   ? Presence of permanent cardiac pacemaker   ? Type II diabetes mellitus ([place])   ? ?Social History  ? ?Socioeconomic History  ? Marital status: Married  ?  Spouse name: Not on file  ? Number of children: Not on file  ? Years of education: Not on file  ? Highest education level: Not on file  ?Occupational History  ? Not on file  ?Tobacco Use  ? Smoking status: Former  ?  Types: Pipe, Cigars  ? Smokeless tobacco: Former  ?  Types: Chew  ? Tobacco comments:  ?  10/10/2015 "quit smoking cigarettes & chewing  tobacco in the 1980s"  ?Vaping Use  ? Vaping Use: Never used  ?Substance and Sexual Activity  ? Alcohol use: No  ?  Alcohol/week: 0.0 standard drinks  ?  Comment: 10/10/2015 "last year I had 2 beers"  ? Drug use: No  ? Sexual activity: Not Currently  ?Other Topics Concern  ? Not on file  ?Social History Narrative  ? Not on file  ? ?Social Determinants of Health  ? ?Financial Resource Strain: Not on file  ?Food Insecurity: Not on file  ?Transportation Needs: Not on file  ?Physical Activity: Not on file  ?Stress: Not on file  ?Social Connections: Not on file  ? ?Family History  ?Problem Relation Age of Onset  ? Hypertension Father   ? Diabetes Mother   ? ?Scheduled Meds: ? donepezil  10 mg Oral Daily  ? DULoxetine  60 mg Oral Daily  ? feeding supplement  1 Container Oral Q24H  ? feeding supplement  237 mL Oral BID BM  ? insulin aspart  0-6 Units Subcutaneous Q6H  ? memantine  10 mg Oral Daily  ? multivitamin with minerals  1 tablet Oral Daily  ? pantoprazole  40 mg Oral Daily  ? ?Continuous Infusions: ? cefTRIAXone (ROCEPHIN)  IV 2 g (07/31/21 2209)  ? lactated ringers 100 mL/hr at 08/01/21 0754  ? ?PRN Meds:.acetaminophen **OR** acetaminophen, dextrose, HYDROmorphone (DILAUDID) injection ?Medications Prior to Admission:  ?Prior to Admission medications   ?Medication Sig Start Date End Date Taking? Authorizing Provider  ?allopurinol (ZYLOPRIM) 300 MG tablet Take 300 mg by mouth daily. 09/30/13  Yes [provider]  ?aspirin 325 MG tablet Take 325 mg by mouth daily.   Yes [provider]  ?donepezil (ARICEPT) 10 MG tablet Take 1 tablet (10 mg total) by mouth daily. 04/04/20  Yes Medina-Vargas, Monina C, NP  ?DULoxetine (CYMBALTA) 60 MG capsule Take 1 capsule (60 mg total) by mouth daily. 04/04/20  Yes Medina-Vargas, Monina C, NP  ?fluticasone (FLONASE) 50 MCG/ACT nasal spray Place 1 spray into both nostrils daily. ?Patient taking differently: Place 1 spray into both nostrils daily as needed for allergies.  04/04/20  Yes Medina-Vargas, Monina C, NP  ?glipiZIDE (GLUCOTROL XL) 2.5 MG 24 hr tablet Take 1 tablet (2.5 mg total) by mouth daily. 04/04/20  Yes Medina-Vargas, Monina C, NP  ?guaiFENesin-dextromethorphan (ROBITUSSIN DM) 100-10 MG/5ML syrup Take 5 mLs by mouth every 4 (four) hours as needed for cough.   Yes [provider]  ?lisinopril-hydrochlorothiazide (ZESTORETIC) 20-25 MG tablet Take 1 tablet by mouth daily.   Yes [provider]  ?loratadine (CLARITIN) 10 MG tablet  Take 10 mg by mouth daily as needed for allergies.   Yes [provider]  ?magnesium hydroxide (MILK OF MAGNESIA) 400 MG/5ML suspension Take 30 mLs by mouth daily as needed for mild constipation.   Yes [provider]  ?memantine (NAMENDA) 10 MG tablet Take 1 tablet by mouth daily.   Yes [provider]  ?omeprazole (PRILOSEC) 40 MG capsule Take 1 capsule (40 mg total) by mouth daily. 04/04/20  Yes Medina-Vargas, Monina C, NP  ?oxybutynin (DITROPAN) 5 MG tablet Take 1 tablet (5 mg total) by mouth 2 (two) times daily. 04/04/20  Yes Medina-Vargas, Monina C, NP  ?oxyCODONE-acetaminophen (PERCOCET/ROXICET) 5-325 MG tablet Take 1 tablet by mouth 2 (two) times daily as needed for moderate pain. 08/04/20  Yes [provider]  ?simvastatin (ZOCOR) 10 MG tablet Take 1 tablet (10 mg total) by mouth at bedtime. 04/04/20  Yes Medina-Vargas, Monina C, NP  ?solifenacin (VESICARE) 10 MG tablet Take 10 mg by mouth daily.   Yes [provider]  ?tetrahydrozoline 0.05 % ophthalmic solution Place 1 drop into both eyes daily as needed (dry eyes).   Yes [provider]  ?traZODone (DESYREL) 150 MG tablet Take 1 tablet (150 mg total) by mouth at bedtime. 04/04/20  Yes Medina-Vargas, Monina C, NP  ?allopurinol (ZYLOPRIM) 100 MG tablet Take 1 tablet (100 mg total) by mouth daily. ?Patient not taking: Reported on 07/30/2021 04/04/20   Medina-Vargas, Senaida Lange, NP  ?Melatonin 3 MG CAPS Take 1 capsule (3 mg total) by  mouth at bedtime. ?Patient not taking: Reported on 07/30/2021 04/04/20   Medina-Vargas, Senaida Lange, NP  ?NON FORMULARY 120ML MED PASS SF DAILY 03/12/20   [provider]  ? ?Allergies  ?Allergen Reactions  ? [place]

## 2021-08-01 NOTE — Plan of Care (Signed)
  Problem: Clinical Measurements: Goal: Will remain free from infection Outcome: Progressing Goal: Diagnostic test results will improve Outcome: Progressing   

## 2021-08-01 NOTE — Progress Notes (Addendum)
?PROGRESS NOTE ? ? ? ?Noah Adkins  ?[id number]  ?DOB: Jul 11, 1926  ?PCP: Donald Prose, MD ?[place] date:07/29/2021 ?Chief compliant: AMS ?69 M w/ h/o dementia, admitted with ARF superimposed on stage IIIb chronic kidney disease, with slowly progressive decreased responsiveness over the last week.In the ER, he was hypotensive, started on IV fluids.  Critical care evaluated patient and discussed with family, who felt patient would not wish for pressors, intubation.   ?Hence admission recommended to Medicine with IV fluids, IV antibiotics and PMT eval  ?Hospital course: Patient admitted to [facility] for acute metabolic encephalopathy, mild hyperkalemia, severe sepsis due to urinary tract infection, and initial hypotension, which improved slightly following IV fluids as well as initiation of IV antibiotic.  ?Palliative consult obtained-Per discussions w/ patient's wife (POA) and son, patient is DNR/DNI; additionally, per these discussions: no pressors, no dialysis, no ICU admission. amendable to IVF's and IV abx, with low threshold for conversion to full comfort care measures if patient subsequently decompensates or if no interval improvement in renal function/mental status. ?5/5: able to recognize son, communicating, has baseline dementia, per son- returned to his baseline.On room air  ?  ? ? ?Subjective: Patient seen and examined this morning.  Denies any acute complaints.  Pleasantly confused and at baseline per family members.  Wife, son and grandson at bedside.  SBP low to normal.  Saturating well on room air. ? ? ?Objective: ?Vitals:  ? 07/31/21 2029 08/01/21 0500 08/01/21 0553 08/01/21 1258  ?BP: (!) 96/54  101/63 111/81  ?Pulse: 97  (!) 103 (!) 110  ?Resp: 16  19 16   ?Temp: 98.3 ?F (36.8 ?C)  98.1 ?F (36.7 ?C) 98.1 ?F (36.7 ?C)  ?TempSrc: Oral  Oral Oral  ?SpO2: 96%  97% 97%  ?Weight:  68.6 kg    ? ? ?Intake/Output Summary (Last 24 hours) at 08/01/2021 1309 ?Last data filed at 08/01/2021 1055 ?Gross per 24 hour  ?Intake  360 ml  ?Output 1675 ml  ?Net -1315 ml  ? ?Filed Weights  ? 07/31/21 0500 08/01/21 0500  ?Weight: 64.4 kg 68.6 kg  ? ? ?Physical Examination: ?General: Moderately built, no acute distress noted ?Head ENT: Atraumatic normocephalic, PERRLA, neck supple ?Heart: S1-S2 heard, regular rate and rhythm, no murmurs.  No leg edema noted ?Lungs: Equal air entry bilaterally, no rhonchi or rales on exam, no accessory muscle use ?Abdomen: Bowel sounds heard, soft, nontender, nondistended. No organomegaly.  No CVA tenderness ?Extremities: No pedal edema.  No cyanosis or clubbing. ?Neurological: Awake alert oriented x3, no focal weakness or numbness, strength and sensations to crude touch intact ?Skin: No wounds or rashes.  ? ? ? ?Data Reviewed: I have personally reviewed following labs and imaging studies ? ?CBC: ?Recent Labs  ?Lab 07/29/21 ?1908 07/30/21 ?0350 07/30/21 ?[id number] 07/31/21 ?[id number] 08/01/21 ?0451  ?WBC 10.3 9.6  --  8.3 8.8  ?NEUTROABS 8.4* 7.7  --   --  5.9  ?HGB 12.4* 10.5* 10.9* 10.0* 9.4*  ?HCT 36.6* 30.2* 32.0* 30.0* 29.2*  ?MCV 96.1 95.0  --  95.8 98.3  ?PLT 277 255  --  237 204  ? ?Basic Metabolic Panel: ?Recent Labs  ?Lab 07/29/21 ?2233 07/30/21 ?0350 07/30/21 ?[id number] 07/31/21 ?[id number] 08/01/21 ?0451  ?NA 139 140 141 141 143  ?K 5.6* 5.1 5.0 4.9 4.8  ?CL 110 108  --  114* 111  ?CO2 17* 19*  --  14* 20*  ?GLUCOSE 142* 80  --  69* 98  ?BUN 155* 148*  --  105* 94*  ?CREATININE 7.12* 6.70*  --  4.29* 3.06*  ?CALCIUM 8.7* 9.0  --  8.6* 8.7*  ?MG  --  1.9  --   --   --   ?PHOS  --  5.1*  --   --   --   ? ?GFR: ?CrCl cannot be calculated (Unknown ideal weight.). ?Liver Function Tests: ?Recent Labs  ?Lab 07/29/21 ?2233 07/30/21 ?0350  ?AST 15 18  ?ALT 9 10  ?ALKPHOS 49 52  ?BILITOT 0.6 0.6  ?PROT 5.4* 5.7*  ?ALBUMIN 2.9* 3.1*  ? ?No results for input(s): LIPASE, AMYLASE in the last 168 hours. ?No results for input(s): AMMONIA in the last 168 hours. ?Coagulation Profile: ?Recent Labs  ?Lab 07/30/21 ?0350  ?INR 1.0  ? ?Cardiac  Enzymes: ?Recent Labs  ?Lab 07/29/21 ?2233  ?CKTOTAL 238  ? ?BNP (last 3 results) ?No results for input(s): PROBNP in the last 8760 hours. ?HbA1C: ?No results for input(s): HGBA1C in the last 72 hours. ?CBG: ?Recent Labs  ?Lab 07/31/21 ?0639 07/31/21 ?1137 07/31/21 ?1747 08/01/21 ?0014 08/01/21 ?0542  ?GLUCAP 78 125* 125* 111* 97  ? ?Lipid Profile: ?No results for input(s): CHOL, HDL, LDLCALC, TRIG, CHOLHDL, LDLDIRECT in the last 72 hours. ?Thyroid Function Tests: ?Recent Labs  ?  07/30/21 ?1332  ?TSH 0.631  ? ?Anemia Panel: ?No results for input(s): VITAMINB12, FOLATE, FERRITIN, TIBC, IRON, RETICCTPCT in the last 72 hours. ?Sepsis Labs: ?Recent Labs  ?Lab 07/29/21 ?2110 07/29/21 ?2205 07/31/21 ?0014  ?LATICACIDVEN 1.5 1.4 1.2  ? ? ?Recent Results (from the past 240 hour(s))  ?Urine Culture     Status: None  ? Collection Time: 07/29/21  6:56 PM  ? Specimen: Urine, Clean Catch  ?Result Value Ref Range Status  ? Specimen Description URINE, CLEAN CATCH  Final  ? Special Requests NONE  Final  ? Culture   Final  ?  NO GROWTH ?Performed at [facility], [place] [address], [place], [place] [address] ?  ? Report Status 07/31/2021 FINAL  Final  ?Culture, blood (routine x 2)     Status: Abnormal  ? Collection Time: 07/29/21  8:40 PM  ? Specimen: BLOOD LEFT HAND  ?Result Value Ref Range Status  ? Specimen Description BLOOD LEFT HAND  Final  ? Special Requests   Final  ?  AEROBIC BOTTLE ONLY Blood Culture results may not be optimal due to an inadequate volume of blood received in culture bottles  ? Culture  Setup Time   Final  ?  GRAM POSITIVE COCCI IN CLUSTERS ?AEROBIC BOTTLE ONLY ?CRITICAL RESULT CALLED TO, READ BACK BY AND VERIFIED WITH: ?PHARMD ELLEN JACKSON 07/30/21@23 :35 BY TW ?  ? Culture (A)  Final  ?  STAPHYLOCOCCUS CAPITIS ?THE SIGNIFICANCE OF ISOLATING THIS ORGANISM FROM A SINGLE SET OF BLOOD CULTURES WHEN MULTIPLE SETS ARE DRAWN IS UNCERTAIN. PLEASE NOTIFY THE MICROBIOLOGY DEPARTMENT WITHIN ONE WEEK IF SPECIATION  AND SENSITIVITIES ARE REQUIRED. ?Performed at [facility], [place] [address], [place], [place] [address] ?  ? Report Status 08/01/2021 FINAL  Final  ?Blood Culture ID Panel (Reflexed)     Status: Abnormal  ? Collection Time: 07/29/21  8:40 PM  ?Result Value Ref Range Status  ? Enterococcus faecalis NOT DETECTED NOT DETECTED Final  ? Enterococcus Faecium NOT DETECTED NOT DETECTED Final  ? Listeria monocytogenes NOT DETECTED NOT DETECTED Final  ? Staphylococcus species DETECTED (A) NOT DETECTED Final  ?  Comment: CRITICAL RESULT CALLED TO, READ BACK BY AND VERIFIED  WITH: ?PHARMD ELLEN JACKSON 07/30/21@23 :35 BY TW ?  ? Staphylococcus aureus (BCID) NOT DETECTED NOT DETECTED Final  ? Staphylococcus epidermidis NOT DETECTED NOT DETECTED Final  ? Staphylococcus lugdunensis NOT DETECTED NOT DETECTED Final  ? Streptococcus species NOT DETECTED NOT DETECTED Final  ? Streptococcus agalactiae NOT DETECTED NOT DETECTED Final  ? Streptococcus pneumoniae NOT DETECTED NOT DETECTED Final  ? Streptococcus pyogenes NOT DETECTED NOT DETECTED Final  ? A.calcoaceticus-baumannii NOT DETECTED NOT DETECTED Final  ? Bacteroides fragilis NOT DETECTED NOT DETECTED Final  ? Enterobacterales NOT DETECTED NOT DETECTED Final  ? Enterobacter cloacae complex NOT DETECTED NOT DETECTED Final  ? Escherichia coli NOT DETECTED NOT DETECTED Final  ? Klebsiella aerogenes NOT DETECTED NOT DETECTED Final  ? Klebsiella oxytoca NOT DETECTED NOT DETECTED Final  ? Klebsiella pneumoniae NOT DETECTED NOT DETECTED Final  ? Proteus species NOT DETECTED NOT DETECTED Final  ? Salmonella species NOT DETECTED NOT DETECTED Final  ? Serratia marcescens NOT DETECTED NOT DETECTED Final  ? Haemophilus influenzae NOT DETECTED NOT DETECTED Final  ? Neisseria meningitidis NOT DETECTED NOT DETECTED Final  ? Pseudomonas aeruginosa NOT DETECTED NOT DETECTED Final  ? Stenotrophomonas maltophilia NOT DETECTED NOT DETECTED Final  ? Candida albicans NOT DETECTED NOT DETECTED Final   ? Candida auris NOT DETECTED NOT DETECTED Final  ? Candida glabrata NOT DETECTED NOT DETECTED Final  ? Candida krusei NOT DETECTED NOT DETECTED Final  ? Candida parapsilosis NOT DETECTED NOT DETECTED Fin

## 2021-08-02 DIAGNOSIS — Z7189 Other specified counseling: Secondary | ICD-10-CM | POA: Diagnosis not present

## 2021-08-02 DIAGNOSIS — N3000 Acute cystitis without hematuria: Secondary | ICD-10-CM | POA: Diagnosis not present

## 2021-08-02 DIAGNOSIS — N1832 Chronic kidney disease, stage 3b: Secondary | ICD-10-CM | POA: Diagnosis not present

## 2021-08-02 DIAGNOSIS — N179 Acute kidney failure, unspecified: Secondary | ICD-10-CM | POA: Diagnosis not present

## 2021-08-02 DIAGNOSIS — G9341 Metabolic encephalopathy: Secondary | ICD-10-CM | POA: Diagnosis not present

## 2021-08-02 DIAGNOSIS — F0390 Unspecified dementia without behavioral disturbance: Secondary | ICD-10-CM | POA: Diagnosis not present

## 2021-08-02 LAB — CBC WITH DIFFERENTIAL/PLATELET
Abs Immature Granulocytes: 0.04 10*3/uL (ref 0.00–0.07)
Basophils Absolute: 0.1 10*3/uL (ref 0.0–0.1)
Basophils Relative: 1 %
Eosinophils Absolute: 0.4 10*3/uL (ref 0.0–0.5)
Eosinophils Relative: 5 %
HCT: 30.7 % — ABNORMAL LOW (ref 39.0–52.0)
Hemoglobin: 9.9 g/dL — ABNORMAL LOW (ref 13.0–17.0)
Immature Granulocytes: 0 %
Lymphocytes Relative: 14 %
Lymphs Abs: 1.2 10*3/uL (ref 0.7–4.0)
MCH: 32.1 pg (ref 26.0–34.0)
MCHC: 32.2 g/dL (ref 30.0–36.0)
MCV: 99.7 fL (ref 80.0–100.0)
Monocytes Absolute: 0.7 10*3/uL (ref 0.1–1.0)
Monocytes Relative: 8 %
Neutro Abs: 6.6 10*3/uL (ref 1.7–7.7)
Neutrophils Relative %: 72 %
Platelets: 198 10*3/uL (ref 150–400)
RBC: 3.08 MIL/uL — ABNORMAL LOW (ref 4.22–5.81)
RDW: 16.7 % — ABNORMAL HIGH (ref 11.5–15.5)
WBC: 9 10*3/uL (ref 4.0–10.5)
nRBC: 0 % (ref 0.0–0.2)

## 2021-08-02 LAB — BASIC METABOLIC PANEL
Anion gap: 9 (ref 5–15)
BUN: 73 mg/dL — ABNORMAL HIGH (ref 8–23)
CO2: 21 mmol/L — ABNORMAL LOW (ref 22–32)
Calcium: 8.7 mg/dL — ABNORMAL LOW (ref 8.9–10.3)
Chloride: 113 mmol/L — ABNORMAL HIGH (ref 98–111)
Creatinine, Ser: 2.33 mg/dL — ABNORMAL HIGH (ref 0.61–1.24)
GFR, Estimated: 26 mL/min — ABNORMAL LOW (ref >60)
Glucose, Bld: 162 mg/dL — ABNORMAL HIGH (ref 70–99)
Potassium: 4.4 mmol/L (ref 3.5–5.1)
Sodium: 143 mmol/L (ref 135–145)

## 2021-08-02 LAB — GLUCOSE, CAPILLARY
Glucose-Capillary: 150 mg/dL — ABNORMAL HIGH (ref 70–99)
Glucose-Capillary: 221 mg/dL — ABNORMAL HIGH (ref 70–99)
Glucose-Capillary: 219 mg/dL — ABNORMAL HIGH (ref 70–99)
Glucose-Capillary: 198 mg/dL — ABNORMAL HIGH (ref 70–99)

## 2021-08-02 NOTE — Progress Notes (Signed)
? ?                                                                                                                                                     ?                                                   ?Daily Progress Note  ? ?Patient Name: Noah Adkins       Date: 08/02/2021 ?DOB: 21-Aug-1926  Age: 88 y.o. MRN#: [id number] ?Attending Physician: Guilford Shi, MD ?Primary Care Physician: Donald Prose, MD ?Admit Date: 07/29/2021 ? ?Reason for Consultation/Follow-up: Establishing goals of care ? ?Subjective: ?I saw and examined Noah Adkins today.  He was awake and alert and reports feeling better.  He answers questions on how he is feeling but does not really participate in conversations regarding goals of care. ? ?Discussed with his wife and children regarding plan to transition home.  His wife asked about differences between home hospice and home palliative care and we discussed this in detail. ? ?Following discussion, family believes that Noah Adkins would benefit most from the extra services that hospice could provide with a goal of staying at home and avoiding future hospitalizations. ? ?Length of Stay: 3 ? ?Current Medications: ?Scheduled Meds:  ? donepezil  10 mg Oral Daily  ? DULoxetine  60 mg Oral Daily  ? feeding supplement  1 Container Oral Q24H  ? feeding supplement  237 mL Oral BID BM  ? insulin aspart  0-6 Units Subcutaneous Q6H  ? memantine  10 mg Oral Daily  ? multivitamin with minerals  1 tablet Oral Daily  ? pantoprazole  40 mg Oral Daily  ? ? ?Continuous Infusions: ? cefTRIAXone (ROCEPHIN)  IV 2 g (08/02/21 2123)  ? ? ?PRN Meds: ?acetaminophen **OR** acetaminophen, dextrose, HYDROmorphone (DILAUDID) injection ? ?Physical Exam        ?General: Alert, awake, in no acute distress.   ?HEENT: No bruits, no goiter, no JVD ?Heart: Regular rate and rhythm. No murmur appreciated. ?Lungs: Good air movement, clear ?Abdomen: Soft, nontender, nondistended, positive bowel sounds.   ?Ext: No significant edema ?Skin:  Warm and dry ?Neuro: Grossly intact, nonfocal.   ? ?Vital Signs: BP 105/69 (BP Location: Right Arm)   Pulse (!) 105   Temp 98.3 ?F (36.8 ?C) (Oral)   Resp 16   Wt 71.9 kg   SpO2 95%   BMI 26.38 kg/m?  ?SpO2: SpO2: 95 % ?O2 Device: O2 Device: Room Air ?O2 Flow Rate:   ? ?Intake/output summary:  ?Intake/Output Summary (Last 24 hours) at 08/02/2021 2202 ?Last data filed at 08/02/2021 1500 ?Gross per 24 hour  ?Intake 825 ml  ?  Output 600 ml  ?Net 225 ml  ? ?LBM: Last BM Date : 08/02/21 ?Baseline Weight: Weight: 64.4 kg ?Most recent weight: Weight: 71.9 kg ? ?     ?Palliative Assessment/Data: ? ? ? ?Flowsheet Rows   ? ?Flowsheet Row Most Recent Value  ?Intake Tab   ?Referral Department Hospitalist  ?Unit at Time of Referral Med/Surg Unit  ?Palliative Care Primary Diagnosis Sepsis/Infectious Disease  ?Date Notified 07/31/21  ?Palliative Care Type New Palliative care  ?Reason for referral Clarify Goals of Care  ?Date of Admission 07/29/21  ?Date first seen by Palliative Care 08/01/21  ?# of days Palliative referral response time 1 Day(s)  ?# of days IP prior to Palliative referral 2  ?Clinical Assessment   ?Palliative Performance Scale Score 50%  ?Psychosocial & Spiritual Assessment   ?Palliative Care Outcomes   ?Patient/Family meeting held? Yes  ?Who was at the meeting? Patient, wife, son, daughter, granddaughter  ?Palliative Care Outcomes Clarified goals of care  ? ?  ? ? ?Patient Active Problem List  ? Diagnosis Date Noted  ? Acute renal failure superimposed on stage 3b chronic kidney disease ([place]) 07/30/2021  ? Pressure injury of skin 07/31/2021  ? Hypovolemic shock ([place]) 07/30/2021  ? Severe sepsis ([place]) 07/30/2021  ? UTI (urinary tract infection) 07/30/2021  ? Hyperkalemia 07/30/2021  ? Acute metabolic encephalopathy [id number]  ? Allergic rhinitis 09/05/2020  ? Anemia in chronic kidney disease 09/05/2020  ? Arthritis of right hand 09/05/2020  ? Chronic pain syndrome 09/05/2020  ? Complete atrioventricular block  ([place]) 09/05/2020  ? Dementia ([place]) 09/05/2020  ? Diabetic renal disease ([place]) 09/05/2020  ? Diabetic retinopathy associated with type 2 diabetes mellitus ([place]) 09/05/2020  ? Drug-induced constipation 09/05/2020  ? Dysphagia following cerebrovascular accident (CVA) 09/05/2020  ? Gout 09/05/2020  ? Hardening of the aorta (main artery of the heart) ([place]) 09/05/2020  ? Insomnia 09/05/2020  ? Leukocytosis 09/05/2020  ? Localized, primary osteoarthritis of hand 09/05/2020  ? Malnutrition of moderate degree [place]: 60% to less than 75% of standard weight) ([place]) 09/05/2020  ? Memory loss 09/05/2020  ? Major depression single episode, in partial remission ([place]) 09/05/2020  ? Osteoarthritis of hands, bilateral 09/05/2020  ? Osteoarthritis of knee 09/05/2020  ? Overactive bladder 09/05/2020  ? Pure hypercholesterolemia 09/05/2020  ? Sick sinus syndrome ([place]) 09/05/2020  ? Vitamin D deficiency 09/05/2020  ? Closed intertrochanteric fracture of right femur ([place]) 04/02/2020  ? History of gout 03/13/2020  ? Closed hip fracture requiring operative repair, sequela 03/02/2020  ? Essential hypertension 03/02/2020  ? Osteoporosis 03/02/2020  ? Hypokalemia 03/02/2020  ? Hypocalcemia 03/02/2020  ? CKD (chronic kidney disease), stage III ([place]) 03/02/2020  ? Pacemaker 03/02/2020  ? Type 2 diabetes mellitus with chronic kidney disease, without long-term current use of insulin ([place]) 03/02/2020  ? TIA (transient ischemic attack) 04/10/2017  ? Symptomatic bradycardia 10/10/2015  ? RBBB 10/30/2013  ? ? ?Palliative Care Assessment & Plan  ? ?Patient Profile: ?88 y.o. male  with past medical history of DM, CKD, dementia admitted on 07/29/2021 with Acute renal failure and UTI.  Plan has been for gentle medical interventions and reassess after some therapies.  Noted family desire to avoid overly invasive interventions.   ? ?Recommendations/Plan: ?Plan for home with hospice at time of discharge.  Family would like to work with [employer].   While he would not qualify from dementia standpoint, I think that he is renal failure is likely to recur in short order with transition home  and off of IV fluids.  I believe his prognosis of his disease follows natural course less than 6 months.  I discussed with family that if he does better than anticipated at home, they could always consider revoking hospice services or he may be determined to no longer be eligible for hospice services if his nutrition and hydration improve and renal failure stabilizes. ? ?Goals of Care and Additional Recommendations: ?Limitations on Scope of Treatment: Avoid Hospitalization ? ?Code Status: ? ?  ?Code Status Orders  ?(From admission, onward)  ?  ? ? ?  ? ?  Start     Ordered  ? 07/30/21 0051  Do not attempt resuscitation (DNR)  Continuous       ?Question Answer Comment  ?In the event of cardiac or respiratory ARREST Do not call a ?code blue?   ?In the event of cardiac or respiratory ARREST Do not perform Intubation, CPR, defibrillation or ACLS   ?In the event of cardiac or respiratory ARREST Use medication by any route, position, wound care, and other measures to relive pain and suffering. May use oxygen, suction and manual treatment of airway obstruction as needed for comfort.   ?Comments no vasopressors, no ICU admission. Fluids, antibiotics only.   ?  ? 07/30/21 0051  ? ?  ?  ? ?  ? ?Code Status History   ? ? Date Active Date Inactive Code Status Order ID Comments User Context  ? 07/30/2021 0030 07/30/2021 0051 DNR [id number]  Kipp Brood, MD ED  ? 03/02/2020 1252 03/12/2020 0434 Full Code [id number]  Sueanne Margarita, DO Inpatient  ? 04/10/2017 1730 04/11/2017 2100 Full Code [id number]  Doreatha Lew, MD ED  ? 10/10/2015 1436 10/11/2015 1454 Full Code [id number]  Baldwin [place], PA-C ED  ? ?  ? ? ?Prognosis: ? < 6 months ? ?Discharge Planning: ?Home with Hospice ? ?Care plan was discussed with patient, family, bedside RN, primary attending, and hospice liaison ? ?Thank you for  allowing the Palliative Medicine Team to assist in the care of this patient. ? ? ?Time In: 1240 Time Out: 1330 Total Time 50 Prolonged Time Billed nO  ? ?   ?Greater than 50%  of this time was spent counseling

## 2021-08-02 NOTE — Progress Notes (Signed)
?PROGRESS NOTE ? ? ? ?Noah Adkins  ?UUE:[id number]  ?DOB: September 17, 1926  ?PCP: Donald Prose, MD ?[place] date:07/29/2021 ?Chief compliant: AMS ?39 M w/ h/o dementia, admitted with ARF superimposed on stage IIIb chronic kidney disease, with slowly progressive decreased responsiveness over the last week.In the ER, he was hypotensive, started on IV fluids.  Critical care evaluated patient and discussed with family, who felt patient would not wish for pressors, intubation.   ?Hence admission recommended to Medicine with IV fluids, IV antibiotics and PMT eval  ?Hospital course: Patient admitted to [facility] for acute metabolic encephalopathy, mild hyperkalemia, severe sepsis due to urinary tract infection, and initial hypotension, which improved slightly following IV fluids as well as initiation of IV antibiotic.  ?Palliative consult obtained-Per discussions w/ patient's wife (POA) and son, patient is DNR/DNI; additionally, per these discussions: no pressors, no dialysis, no ICU admission. amendable to IVF's and IV abx, with low threshold for conversion to full comfort care measures if patient subsequently decompensates or if no interval improvement in renal function/mental status. ?5/5: able to recognize son, communicating, has baseline dementia, per son- returned to his baseline.On room air  ?5/6: Met with wife, son and grandson at bedside and they are contemplating home with home hospice or palliative care.  Would like to continue IV fluids for another day.  Patient pleasantly confused ?  ? ? ?Subjective: Patient seen and examined this morning.  Seems much improved and eating ice cream by himself.  Was apparently restless and agitated earlier in the day.  Renal function continues to improve.  Family about to meet with palliative care MD for follow-up. ? ?Objective: ?Vitals:  ? 08/01/21 2059 08/02/21 0500 08/02/21 9150 08/02/21 0829  ?BP: (!) 84/60  99/61 106/70  ?Pulse: 98  (!) 110 (!) 105  ?Resp: _0 ?Temp: 98.7 ?F (37.1  ?C)  97.9 ?F (36.6 ?C) 98.4 ?F (36.9 ?C)  ?TempSrc: Oral  Axillary Oral  ?SpO2: 95%  100% 100%  ?Weight:  71.9 kg    ? ? ?Intake/Output Summary (Last 24 hours) at 08/02/2021 1335 ?Last data filed at 08/02/2021 0900 ?Gross per 24 hour  ?Intake 1665 ml  ?Output 500 ml  ?Net 1165 ml  ? ?Filed Weights  ? 07/31/21 0500 08/01/21 0500 08/02/21 0500  ?Weight: 64.4 kg 68.6 kg 71.9 kg  ? ? ?Physical Examination: ?General: Moderately built, no acute distress noted ?Head ENT: Atraumatic normocephalic, PERRLA, neck supple ?Heart: S1-S2 heard, regular rate and rhythm, no murmurs.  No leg edema noted ?Lungs: Equal air entry bilaterally, no rhonchi or rales on exam, no accessory muscle use ?Abdomen: Bowel sounds heard, soft, nontender, nondistended. No organomegaly.  No CVA tenderness ?Extremities: No pedal edema.  No cyanosis or clubbing. ?Neurological: Pleasantly confused, baseline dementia ?Skin: No wounds or rashes.  ? ? ? ?Data Reviewed: I have personally reviewed following labs and imaging studies ? ?CBC: ?Recent Labs  ?Lab 07/29/21 ?1908 07/30/21 ?0350 07/30/21 ?5697 07/31/21 ?9480 08/01/21 ?0451 08/02/21 ?1655  ?WBC 10.3 9.6  --  8.3 8.8 9.0  ?NEUTROABS 8.4* 7.7  --   --  5.9 6.6  ?HGB 12.4* 10.5* 10.9* 10.0* 9.4* 9.9*  ?HCT 36.6* 30.2* 32.0* 30.0* 29.2* 30.7*  ?MCV 96.1 95.0  --  95.8 98.3 99.7  ?PLT 277 255  --  237 204 198  ? ?Basic Metabolic Panel: ?Recent Labs  ?Lab 07/29/21 ?2233 07/30/21 ?0350 07/30/21 ?3748 07/31/21 ?2707 08/01/21 ?0451 08/02/21 ?8675  ?NA 139 140 141 141 143  143  ?K 5.6* 5.1 5.0 4.9 4.8 4.4  ?CL 110 108  --  114* 111 113*  ?CO2 17* 19*  --  14* 20* 21*  ?GLUCOSE 142* 80  --  69* 98 162*  ?BUN 155* 148*  --  105* 94* 73*  ?CREATININE 7.12* 6.70*  --  4.29* 3.06* 2.33*  ?CALCIUM 8.7* 9.0  --  8.6* 8.7* 8.7*  ?MG  --  1.9  --   --   --   --   ?PHOS  --  5.1*  --   --   --   --   ? ?GFR: ?CrCl cannot be calculated (Unknown ideal weight.). ?Liver Function Tests: ?Recent Labs  ?Lab 07/29/21 ?2233  07/30/21 ?0350  ?AST 15 18  ?ALT 9 10  ?ALKPHOS 49 52  ?BILITOT 0.6 0.6  ?PROT 5.4* 5.7*  ?ALBUMIN 2.9* 3.1*  ? ?No results for input(s): LIPASE, AMYLASE in the last 168 hours. ?No results for input(s): AMMONIA in the last 168 hours. ?Coagulation Profile: ?Recent Labs  ?Lab 07/30/21 ?0350  ?INR 1.0  ? ?Cardiac Enzymes: ?Recent Labs  ?Lab 07/29/21 ?2233  ?CKTOTAL 238  ? ?BNP (last 3 results) ?No results for input(s): PROBNP in the last 8760 hours. ?HbA1C: ?No results for input(s): HGBA1C in the last 72 hours. ?CBG: ?Recent Labs  ?Lab 08/01/21 ?8099 08/01/21 ?1313 08/01/21 ?1701 08/01/21 ?2358 08/02/21 ?8338  ?GLUCAP 97 166* 118* 210* 150*  ? ?Lipid Profile: ?No results for input(s): CHOL, HDL, LDLCALC, TRIG, CHOLHDL, LDLDIRECT in the last 72 hours. ?Thyroid Function Tests: ?No results for input(s): TSH, T4TOTAL, FREET4, T3FREE, THYROIDAB in the last 72 hours. ? ?Anemia Panel: ?No results for input(s): VITAMINB12, FOLATE, FERRITIN, TIBC, IRON, RETICCTPCT in the last 72 hours. ?Sepsis Labs: ?Recent Labs  ?Lab 07/29/21 ?2110 07/29/21 ?2205 07/31/21 ?0014  ?LATICACIDVEN 1.5 1.4 1.2  ? ? ?Recent Results (from the past 240 hour(s))  ?Urine Culture     Status: None  ? Collection Time: 07/29/21  6:56 PM  ? Specimen: Urine, Clean Catch  ?Result Value Ref Range Status  ? Specimen Description URINE, CLEAN CATCH  Final  ? Special Requests NONE  Final  ? Culture   Final  ?  NO GROWTH ?Performed at [facility], [place] [address], [place], [place] [address] ?  ? Report Status 07/31/2021 FINAL  Final  ?Culture, blood (routine x 2)     Status: Abnormal  ? Collection Time: 07/29/21  8:40 PM  ? Specimen: BLOOD LEFT HAND  ?Result Value Ref Range Status  ? Specimen Description BLOOD LEFT HAND  Final  ? Special Requests   Final  ?  AEROBIC BOTTLE ONLY Blood Culture results may not be optimal due to an inadequate volume of blood received in culture bottles  ? Culture  Setup Time   Final  ?  GRAM POSITIVE COCCI IN CLUSTERS ?AEROBIC  BOTTLE ONLY ?CRITICAL RESULT CALLED TO, READ BACK BY AND VERIFIED WITH: ?PHARMD ELLEN JACKSON 07/30/21_0 :35 BY TW ?  ? Culture (A)  Final  ?  STAPHYLOCOCCUS CAPITIS ?THE SIGNIFICANCE OF ISOLATING THIS ORGANISM FROM A SINGLE SET OF BLOOD CULTURES WHEN MULTIPLE SETS ARE DRAWN IS UNCERTAIN. PLEASE NOTIFY THE MICROBIOLOGY DEPARTMENT WITHIN ONE WEEK IF SPECIATION AND SENSITIVITIES ARE REQUIRED. ?Performed at [facility], [place] [address], [place], [place] [address] ?  ? Report Status 08/01/2021 FINAL  Final  ?Blood Culture ID Panel (Reflexed)     Status: Abnormal  ? Collection Time: 07/29/21  8:40 PM  ?Result Value Ref Range Status  ? Enterococcus faecalis NOT DETECTED NOT DETECTED Final  ? Enterococcus Faecium NOT DETECTED NOT DETECTED Final  ? Listeria monocytogenes NOT DETECTED NOT DETECTED Final  ? Staphylococcus species DETECTED (A) NOT DETECTED Final  ?  Comment: CRITICAL RESULT CALLED TO, READ BACK BY AND VERIFIED WITH: ?PHARMD ELLEN JACKSON 07/30/21_0 :35 BY TW ?  ? Staphylococcus aureus (BCID) NOT DETECTED NOT DETECTED Final  ? Staphylococcus epidermidis NOT DETECTED NOT DETECTED Final  ? Staphylococcus lugdunensis NOT DETECTED NOT DETECTED Final  ? Streptococcus species NOT DETECTED NOT DETECTED Final  ? Streptococcus agalactiae NOT DETECTED NOT DETECTED Final  ? Streptococcus pneumoniae NOT DETECTED NOT DETECTED Final  ? Streptococcus pyogenes NOT DETECTED NOT DETECTED Final  ? A.calcoaceticus-baumannii NOT DETECTED NOT DETECTED Final  ? Bacteroides fragilis NOT DETECTED NOT DETECTED Final  ? Enterobacterales NOT DETECTED NOT DETECTED Final  ? Enterobacter cloacae complex NOT DETECTED NOT DETECTED Final  ? Escherichia coli NOT DETECTED NOT DETECTED Final  ? Klebsiella aerogenes NOT DETECTED NOT DETECTED Final  ? Klebsiella oxytoca NOT DETECTED NOT DETECTED Final  ? Klebsiella pneumoniae NOT DETECTED NOT DETECTED Final  ? Proteus species NOT DETECTED NOT DETECTED Final  ? Salmonella species NOT DETECTED NOT  DETECTED Final  ? Serratia marcescens NOT DETECTED NOT DETECTED Final  ? Haemophilus influenzae NOT DETECTED NOT DETECTED Final  ? Neisseria meningitidis NOT DETECTED NOT DETECTED Final  ? Pseudomonas aeruginosa NOT

## 2021-08-02 NOTE — Progress Notes (Signed)
Manufacturing engineer [facility]) Hospital Liaison Note ? ?Referral received for patient/family interest in home with hospice. [place] liaison spoke with patient's wife Diane to confirm interest. Interest confirmed.  ? ?Hospice eligibility pending.  ? ?Plan is to discharge home tomorrow 5.8 via [facility].  ? ?DME in the home: walker and wheelchair ? ?DME needs: hospital bed and hoyer lift ? ?Diane is the contact for DME needs.  ? ?Please send patient home with comfort prescriptions/medications at discharge.  ? ?Please call with any questions or concerns. Thank you ? ?Roselee Nova, LCSW ?[facility] Liaison ?[phone] ?

## 2021-08-02 NOTE — TOC Progression Note (Signed)
Transition of Care (TOC) - Progression Note  ? ? ?Patient Details  ?Name: Noah Adkins ?MRN: [id number] ?Date of Birth: [birth year] ? ?Transition of Care (TOC) CM/SW Contact  ?Ross Ludwig, LCSW ?Phone Number: ?08/02/2021, 3:33 PM ? ?Clinical Narrative:    ? ?CSW received consult that patient's family have decided to have him go home with hospice through [facility].  CSW spoke to patient's son to confirm decision on agency and disposition plan.  Per patient's son Richardson Landry [phone], they only need a hospital bed at this time.  They have a walker, bedside commode, and wheelchair.  Per patient's son, patient's wife can be contact or anyone else on the face sheet to make arrangement for hospital bed delivery.  CSW notified [facility] and sent them a message that patient's family would like them as a hospice agency.  Patient will most likely be ready tomorrow, depending on what the attending physician decides and how long it will take to get the hospital bed.  TOC to continue to folllow patient's progress throughout discharge plan. ? ? ?Expected Discharge Plan: Home/Self Care ?Barriers to Discharge: No Barriers Identified ? ?Expected Discharge Plan and Services ?Expected Discharge Plan: Home/Self Care ?  ?Discharge Planning Services: CM Consult ?  ?Living arrangements for the past 2 months: [place] ?                ?  ?  ?  ?  ?  ?  ?  ?  ?  ?  ? ? ?Social Determinants of Health (SDOH) Interventions ?  ? ?Readmission Risk Interventions ?   ? View : No data to display.  ?  ?  ?  ? ? ?

## 2021-08-03 DIAGNOSIS — N1832 Chronic kidney disease, stage 3b: Secondary | ICD-10-CM | POA: Diagnosis not present

## 2021-08-03 DIAGNOSIS — N179 Acute kidney failure, unspecified: Secondary | ICD-10-CM | POA: Diagnosis not present

## 2021-08-03 LAB — BASIC METABOLIC PANEL
Anion gap: 9 (ref 5–15)
BUN: 58 mg/dL — ABNORMAL HIGH (ref 8–23)
CO2: 21 mmol/L — ABNORMAL LOW (ref 22–32)
Calcium: 8.9 mg/dL (ref 8.9–10.3)
Chloride: 114 mmol/L — ABNORMAL HIGH (ref 98–111)
Creatinine, Ser: 1.91 mg/dL — ABNORMAL HIGH (ref 0.61–1.24)
GFR, Estimated: 32 mL/min — ABNORMAL LOW (ref >60)
Glucose, Bld: 185 mg/dL — ABNORMAL HIGH (ref 70–99)
Potassium: 4 mmol/L (ref 3.5–5.1)
Sodium: 144 mmol/L (ref 135–145)

## 2021-08-03 LAB — CBC WITH DIFFERENTIAL/PLATELET
Abs Immature Granulocytes: 0.05 10*3/uL (ref 0.00–0.07)
Basophils Absolute: 0.1 10*3/uL (ref 0.0–0.1)
Basophils Relative: 1 %
Eosinophils Absolute: 0.3 10*3/uL (ref 0.0–0.5)
Eosinophils Relative: 4 %
HCT: 29.9 % — ABNORMAL LOW (ref 39.0–52.0)
Hemoglobin: 9.8 g/dL — ABNORMAL LOW (ref 13.0–17.0)
Immature Granulocytes: 1 %
Lymphocytes Relative: 11 %
Lymphs Abs: 1 10*3/uL (ref 0.7–4.0)
MCH: 32.3 pg (ref 26.0–34.0)
MCHC: 32.8 g/dL (ref 30.0–36.0)
MCV: 98.7 fL (ref 80.0–100.0)
Monocytes Absolute: 0.5 10*3/uL (ref 0.1–1.0)
Monocytes Relative: 5 %
Neutro Abs: 7.5 10*3/uL (ref 1.7–7.7)
Neutrophils Relative %: 78 %
Platelets: 188 10*3/uL (ref 150–400)
RBC: 3.03 MIL/uL — ABNORMAL LOW (ref 4.22–5.81)
RDW: 16.2 % — ABNORMAL HIGH (ref 11.5–15.5)
WBC: 9.4 10*3/uL (ref 4.0–10.5)
nRBC: 0 % (ref 0.0–0.2)

## 2021-08-03 LAB — GLUCOSE, CAPILLARY
Glucose-Capillary: 177 mg/dL — ABNORMAL HIGH (ref 70–99)
Glucose-Capillary: 212 mg/dL — ABNORMAL HIGH (ref 70–99)

## 2021-08-03 LAB — CULTURE, BLOOD (ROUTINE X 2): Culture: NO GROWTH

## 2021-08-03 MED ORDER — OXYCODONE-ACETAMINOPHEN 5-325 MG PO TABS: 1.0000 | ORAL_TABLET | Freq: Two times a day (BID) | ORAL | 0 refills | Status: AC | PRN

## 2021-08-03 NOTE — Plan of Care (Signed)
°  Problem: Clinical Measurements: °Goal: Respiratory complications will improve °Outcome: Adequate for Discharge °  °Problem: Clinical Measurements: °Goal: Cardiovascular complication will be avoided °Outcome: Adequate for Discharge °  °Problem: Nutrition: °Goal: Adequate nutrition will be maintained °Outcome: Adequate for Discharge °  °Problem: Elimination: °Goal: Will not experience complications related to bowel motility °Outcome: Adequate for Discharge °  °Problem: Pain Managment: °Goal: General experience of comfort will improve °Outcome: Adequate for Discharge °  °

## 2021-08-03 NOTE — Discharge Summary (Addendum)
Physician Discharge Summary  ?Noah Adkins [id number] DOB: 1926/12/26 DOA: 07/29/2021 ? ?PCP: Noah Prose, MD ? ?Admit date: 07/29/2021 ?Discharge date: 08/03/2021 ?Recommendations for Outpatient Follow-up:  ?Follow up with PCP in 1 weeks-call for appointment ? ?Discharge Dispo: home /w hospice ?Discharge Condition: Stable ?Code Status:   Code Status: DNR ?Diet recommendation:  ?Diet Order   ? ?       ?  Diet - low sodium heart healthy       ?  ?  Diet regular Room service appropriate? Yes; Fluid consistency: Thin  Diet effective now       ?  ? ?  ?  ? ?  ?  ? ?Brief/Interim Summary: ?88 y.o. M with DM, chronic kidney disease who presented with slowly progressive decreased responsiveness over the last week.In the ER, he was hypotensive, started on IV fluids.  Critical care evaluated patient and discussed with family, who felt patient would not wish for pressors, intubation.   ? And was admited to medicine with IV fluids, IV antibiotics and PMT eval. ?Patient was hydrated with IV fluids for his profound AKI which improved nicely.  Seen by palliative care to further discuss goals of care and family opted to go home with hospice care.  At this time patient remains alert awake with baseline dementia, blood pressure stable, creatinine improved to 1.9, hemoglobin 9.8 g. ?Is being discharged to home with hospice care  ?Discussed with wife requesting pain medication until he can get his supplies from  tomorrow. ?Agreeable with discharge disposition ?Discharge Diagnoses:  ?Principal Problem: ?  Acute renal failure superimposed on stage 3b chronic kidney disease ([place]) ?Active Problems: ?  Essential hypertension ?  Pacemaker ?  Type 2 diabetes mellitus with chronic kidney disease, without long-term current use of insulin ([place]) ?  Anemia in chronic kidney disease ?  Dementia ([place]) ?  Malnutrition of moderate degree ([place]: 60% to less than 75% of standard weight) ([place]) ?  Sick sinus syndrome ([place]) ?  Hypovolemic shock ([place]) ?   Severe sepsis ([place]) ?  UTI (urinary tract infection) ?  Hyperkalemia ?  Acute metabolic encephalopathy ?  Pressure injury of skin ? ?1: Profound AKI on CKD stage 3b: Likely due to FTT, dehydration, hypotension. Baseline renal function 68/1.9-->155/7.1 on presentation, improved to baseline with IVF.Goals of care per palliative care team discussions--family opted with home hospice.  ?  ?2. Hypovolemia hypotension: stable bp now ? ?3. Acute metabolic encephalopathy with underlying dementia: POA likely due to uremia in the setting of #1 and #2. Now improved to baseline cognitive status.   ?4. UTI/sepsis: suspected on presentation and started On empiric abx. Urine cx however so far negative.    Staph capitis bacteremia in a single set of blood culture likely contamination.  No leukocytosis or fever.  At this point, UTI ruled out. ?  ?5. Electrolyte abnormalities: Hyperkalemia, Metabolic acidosis, hyperchloremia: related to #1 and improved with IVF ?  ?6. Sick sinus syndrome w/ Pacemaker ?  ?7. Anemia in chronic kidney disease-  Hgb 9.4  stable. ?  ?8. Malnutrition of moderate degree: Augment diet as tolerated.  Dietitian consulted ? ?Pressure Injury 07/30/21 Sacrum Medial Stage 1 -  Intact skin with non-blanchable redness of a localized area usually over a bony prominence. (Active)  ?07/30/21 1400  ?Location: Sacrum  ?Location Orientation: Medial  ?Staging: Stage 1 -  Intact skin with non-blanchable redness of a localized area usually over a bony prominence.  ?Wound Description (Comments):   ?  Present on Admission: Yes  ?Dressing Type Foam - Lift dressing to assess site every shift 08/03/21 0800  ? ? ?Consults: ?PMT ?Subjective: ?AAOX2 ? ?Discharge Exam: ?Vitals:  ? 08/03/21 0928 08/03/21 1317  ?BP: 102/71 101/71  ?Pulse: (!) 115 (!) 110  ?Resp: 16 16  ?Temp: 99 ?F (37.2 ?C) 98.3 ?F (36.8 ?C)  ?SpO2: 96% 100%  ? ?General: Pt is alert, awake, not in acute distress ?Cardiovascular: RRR, S1/S2 +, no rubs, no  gallops ?Respiratory: CTA bilaterally, no wheezing, no rhonchi ?Abdominal: Soft, NT, ND, bowel sounds + ?Extremities: no edema, no cyanosis ? ?Discharge Instructions ? ?Discharge Instructions   ? ? Diet - low sodium heart healthy   Complete by: As directed ?  ? Discharge instructions   Complete by: As directed ?  ? Follow with hospice at home  ? Discharge wound care:   Complete by: As directed ?  ? Foam dressing on sacrum daily  ? ?  ? ?Allergies as of 08/03/2021   ? ?   Reactions  ? Atenolol Nausea Only, Other (See Comments)  ? Dizzy, headache  ? Dilacor Xr [diltiazem Hcl] Other (See Comments)  ? NOT EFFECTIVE  ? Isoptin Sr [verapamil Hcl Er] Other (See Comments)  ? Erectile dysfunction  ? Lopressor [metoprolol Tartrate] Nausea Only, Other (See Comments)  ? dizzy, headache  ? Norvasc [amlodipine Besylate] Other (See Comments)  ? Headache and erectile dysfunction  ? ?  ? ?  ?Medication List  ?  ? ?STOP taking these medications   ? ?glipiZIDE 2.5 MG 24 hr tablet ?Commonly known as: GLUCOTROL XL ?  ?lisinopril-hydrochlorothiazide 20-25 MG tablet ?Commonly known as: ZESTORETIC ?  ?Melatonin 3 MG Caps ?  ? ?  ? ?TAKE these medications   ? ?allopurinol 300 MG tablet ?Commonly known as: ZYLOPRIM ?Take 300 mg by mouth daily. ?What changed: Another medication with the same name was removed. Continue taking this medication, and follow the directions you see here. ?  ?aspirin 325 MG tablet ?Take 325 mg by mouth daily. ?  ?donepezil 10 MG tablet ?Commonly known as: ARICEPT ?Take 1 tablet (10 mg total) by mouth daily. ?  ?DULoxetine 60 MG capsule ?Commonly known as: CYMBALTA ?Take 1 capsule (60 mg total) by mouth daily. ?  ?fluticasone 50 MCG/ACT nasal spray ?Commonly known as: FLONASE ?Place 1 spray into both nostrils daily. ?What changed:  ?when to take this ?reasons to take this ?  ?guaiFENesin-dextromethorphan 100-10 MG/5ML syrup ?Commonly known as: ROBITUSSIN DM ?Take 5 mLs by mouth every 4 (four) hours as needed for  cough. ?  ?loratadine 10 MG tablet ?Commonly known as: CLARITIN ?Take 10 mg by mouth daily as needed for allergies. ?  ?magnesium hydroxide 400 MG/5ML suspension ?Commonly known as: MILK OF MAGNESIA ?Take 30 mLs by mouth daily as needed for mild constipation. ?  ?memantine 10 MG tablet ?Commonly known as: NAMENDA ?Take 1 tablet by mouth daily. ?  ?NON FORMULARY ?120ML MED PASS SF DAILY ?  ?omeprazole 40 MG capsule ?Commonly known as: PRILOSEC ?Take 1 capsule (40 mg total) by mouth daily. ?  ?oxybutynin 5 MG tablet ?Commonly known as: DITROPAN ?Take 1 tablet (5 mg total) by mouth 2 (two) times daily. ?  ?oxyCODONE-acetaminophen 5-325 MG tablet ?Commonly known as: PERCOCET/ROXICET ?Take 1 tablet by mouth 2 (two) times daily as needed for up to 4 doses for moderate pain. ?  ?simvastatin 10 MG tablet ?Commonly known as: ZOCOR ?Take 1 tablet (10 mg total) by mouth  at bedtime. ?  ?solifenacin 10 MG tablet ?Commonly known as: VESICARE ?Take 10 mg by mouth daily. ?  ?tetrahydrozoline 0.05 % ophthalmic solution ?Place 1 drop into both eyes daily as needed (dry eyes). ?  ?traZODone 150 MG tablet ?Commonly known as: DESYREL ?Take 1 tablet (150 mg total) by mouth at bedtime. ?  ? ?  ? ?  ?  ? ? ?  ?Discharge Care Instructions  ?(From admission, onward)  ?  ? ? ?  ? ?  Start     Ordered  ? 08/03/21 0000  Discharge wound care:       ?Comments: Foam dressing on sacrum daily  ? 08/03/21 1157  ? ?  ?  ? ?  ? ? Follow-up Information   ? ? Noah Prose, MD Follow up in 1 week(s).   ?Specialty: Family Medicine ?Contact information: ?[place] ?[address] ?[place] [address] ?[phone] ? ? ?  ?  ? ?  ?  ? ?  ? ?Allergies  ?Allergen Reactions  ? Atenolol Nausea Only and Other (See Comments)  ?  Dizzy, headache  ? Dilacor Xr [Diltiazem Hcl] Other (See Comments)  ?  NOT EFFECTIVE  ? Isoptin Sr [Verapamil Hcl Er] Other (See Comments)  ?  Erectile dysfunction  ? Lopressor [Metoprolol Tartrate] Nausea Only and Other (See Comments)   ?  dizzy, headache  ? Norvasc [Amlodipine Besylate] Other (See Comments)  ?  Headache and erectile dysfunction  ? ? ?The results of significant diagnostics from this hospitalization (including imaging, microbiology, ancilla

## 2021-08-03 NOTE — TOC Transition Note (Signed)
Transition of Care (TOC) - CM/SW Discharge Note ? ? ?Patient Details  ?Name: Noah Adkins ?MRN: [id number] ?Date of Birth: 1927/01/24 ? ?Transition of Care [facility]) CM/SW Contact:  ?Leeroy Cha, RN ?Phone Number: ?08/03/2021, 11:24 AM ? ? ?Clinical Narrative:    ?Patient to be dcd to home today, tct-diane the wife,message left for her to return call.  Intouch with [facility]. Awaiting dc order to call ptar. ? ? ?Final next level of care: Home/Self Care ?Barriers to Discharge: No Barriers Identified ? ? ?Patient Goals and CMS Choice ?Patient states their goals for this hospitalization and ongoing recovery are:: to go [place] ?CMS Medicare.gov Compare Post Acute Care list provided to:: Patient ?  ? ?Discharge Placement ?  ?           ?  ?  ?  ?  ? ?Discharge Plan and Services ?  ?Discharge Planning Services: CM Consult ?           ?  ?  ?  ?  ?  ?  ?  ?  ?  ?  ? ?Social Determinants of Health (SDOH) Interventions ?  ? ? ?Readmission Risk Interventions ?   ? View : No data to display.  ?  ?  ?  ? ? ? ? ? ?

## 2021-08-05 ENCOUNTER — Ambulatory Visit: Payer: Medicare HMO | Admitting: Podiatry

## 2021-08-27 DEATH — deceased

## 2021-11-10 IMAGING — CT CT ABD-PELV W/O CM
2 of 4 series · 16 of 46 positions shown, 18 images · non-contrast
Comparison: Right hip radiography 03/03/2020.

CLINICAL DATA: Right hip pain.  Bowel obstruction suspected

EXAM:
CT ABDOMEN AND PELVIS WITHOUT CONTRAST
TECHNIQUE: Multidetector CT imaging of the abdomen and pelvis was performed
following the standard protocol without IV contrast.

[Series 2: axial st · axial · 0.85mm/px · z∈[-452,-32]mm · 13 of 97 slices shown, 15 images]
[im 7/97  soft-tissue]
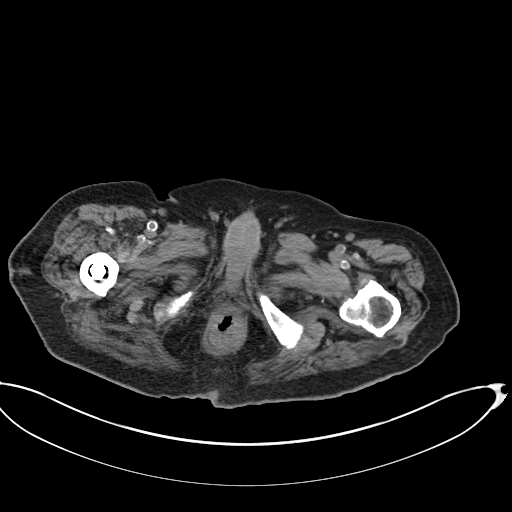
[im 7/97  bone]
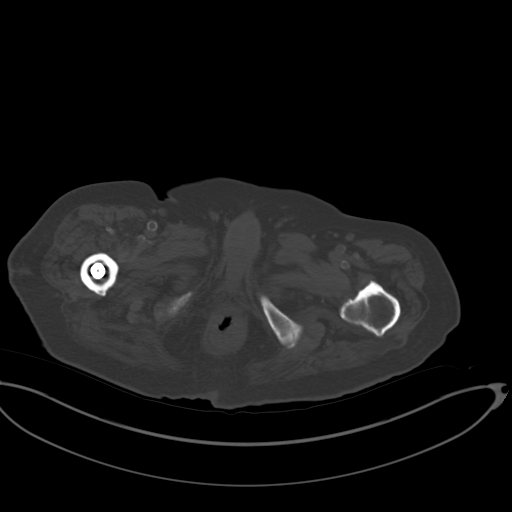
[im 13/97  soft-tissue]
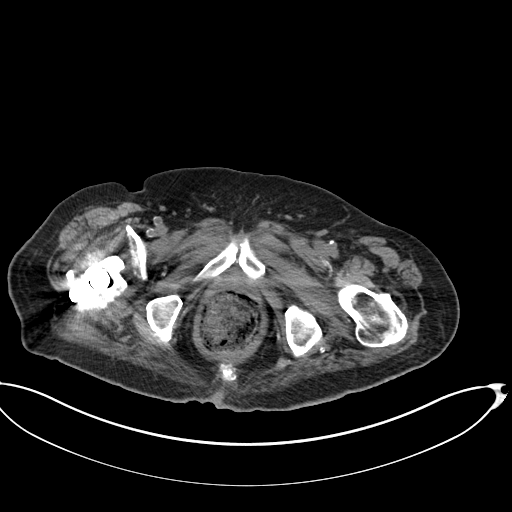
[im 19/97  soft-tissue]
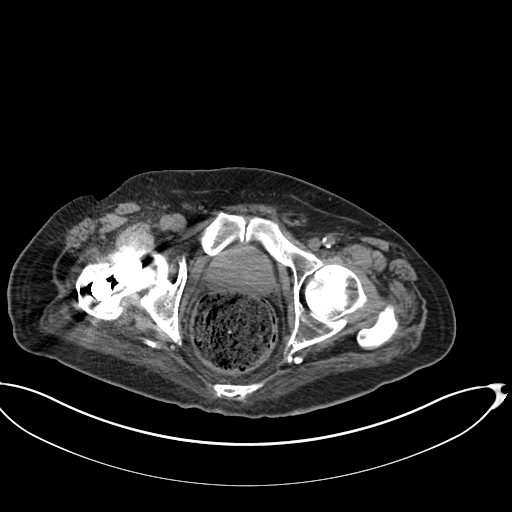
[im 31/97  soft-tissue]
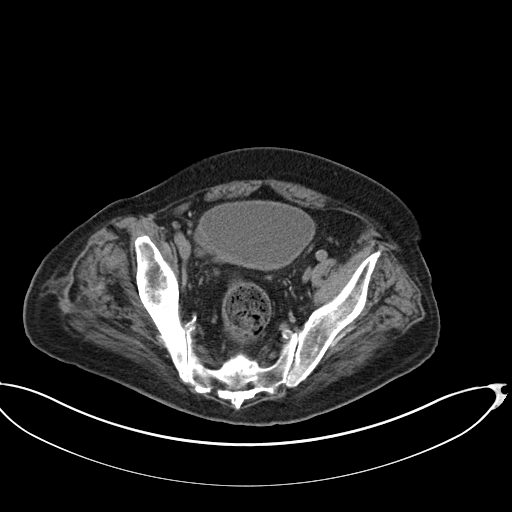
[im 37/97  soft-tissue]
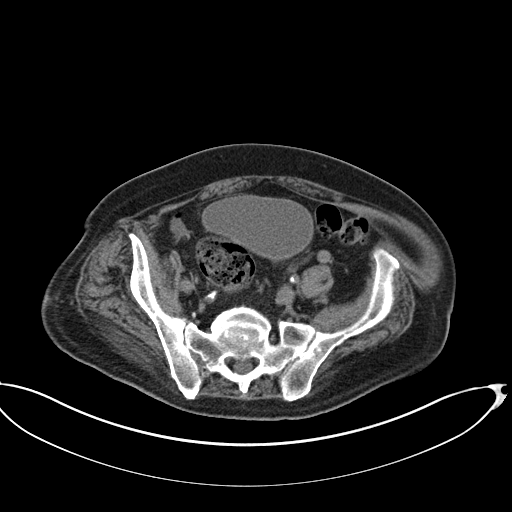
[im 43/97  soft-tissue]
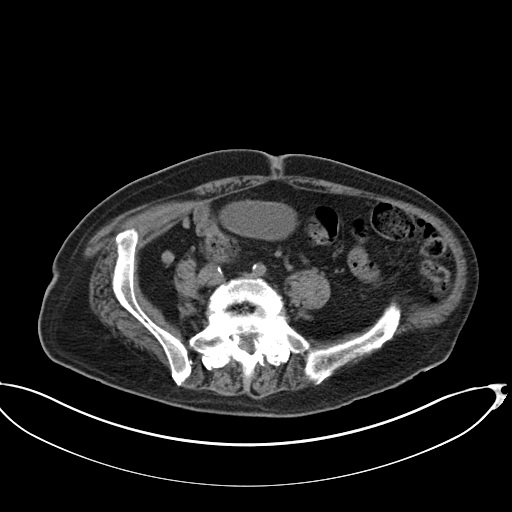
[im 49/97  soft-tissue]
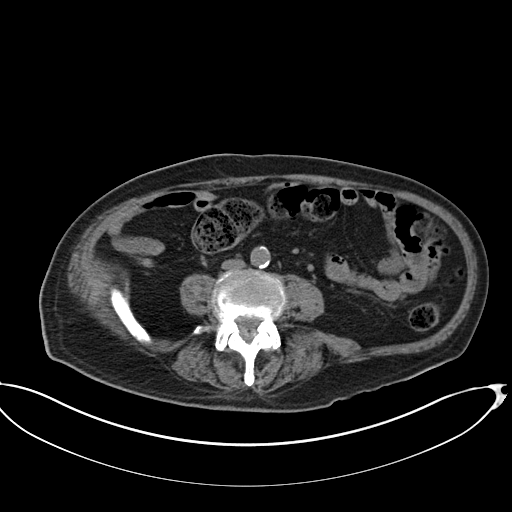
[im 55/97  soft-tissue]
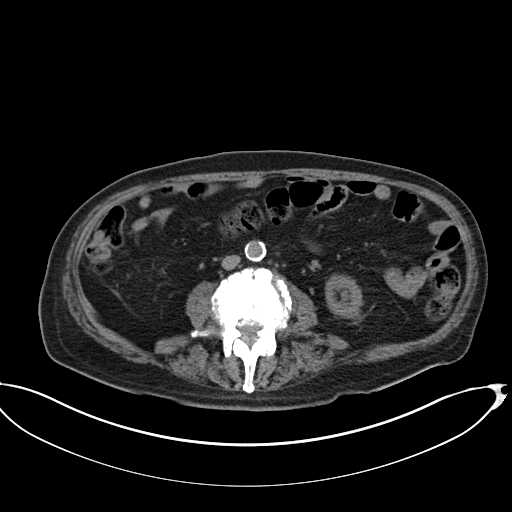
[im 61/97  soft-tissue]
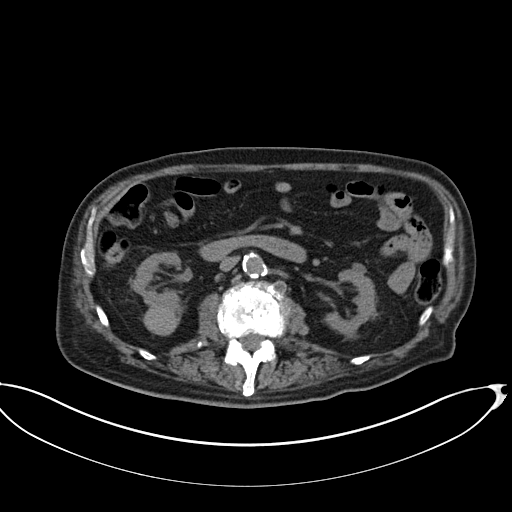
[im 61/97  bone]
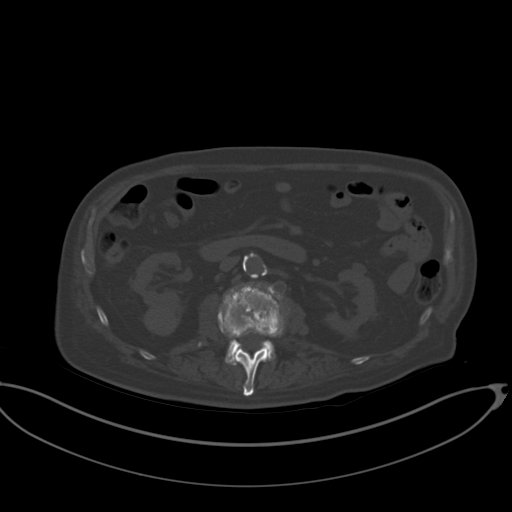
[im 67/97  soft-tissue]
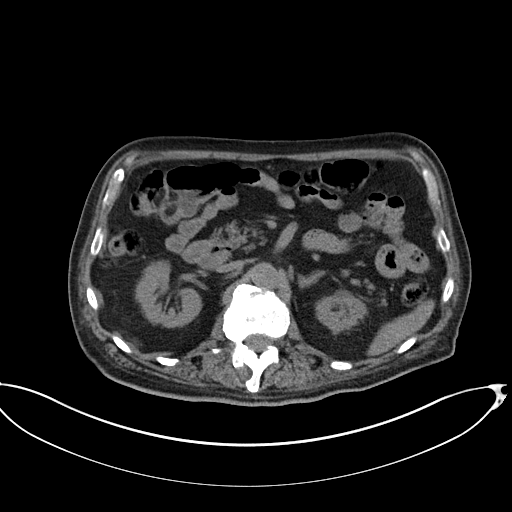
[im 79/97  soft-tissue]
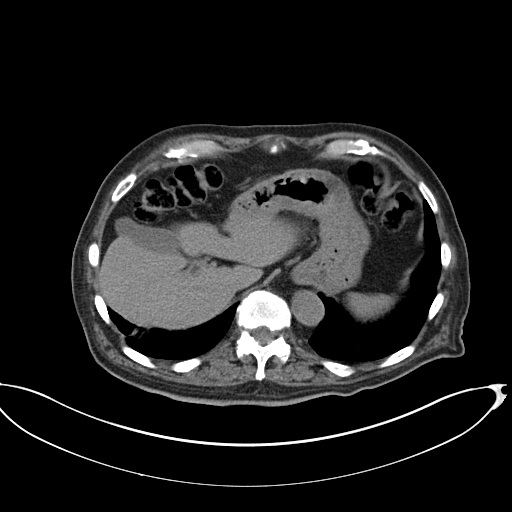
[im 85/97  soft-tissue]
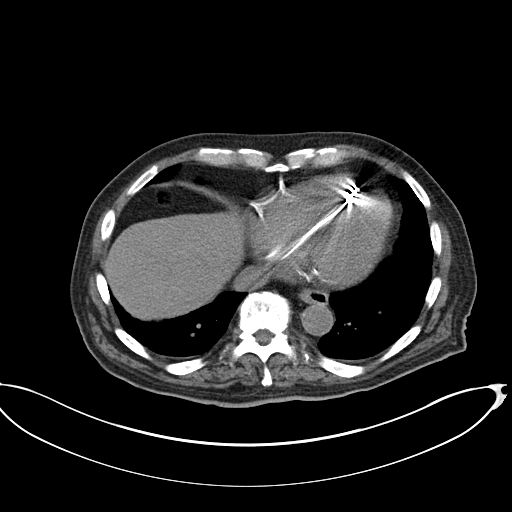
[im 91/97  soft-tissue]
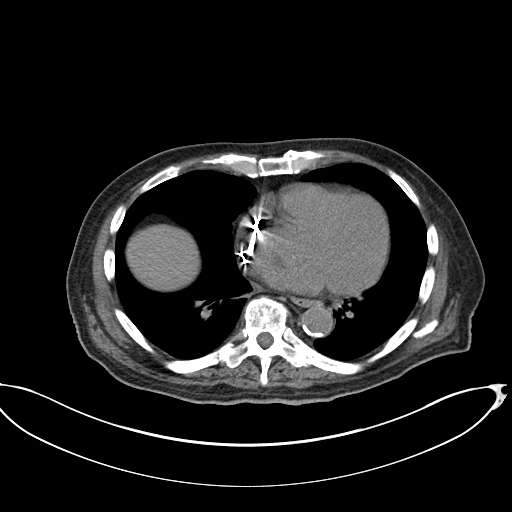

[Series 5: coronal st · coronal · 0.80mm/px · 3 of 138 slices shown]
[im 46/138  soft-tissue]
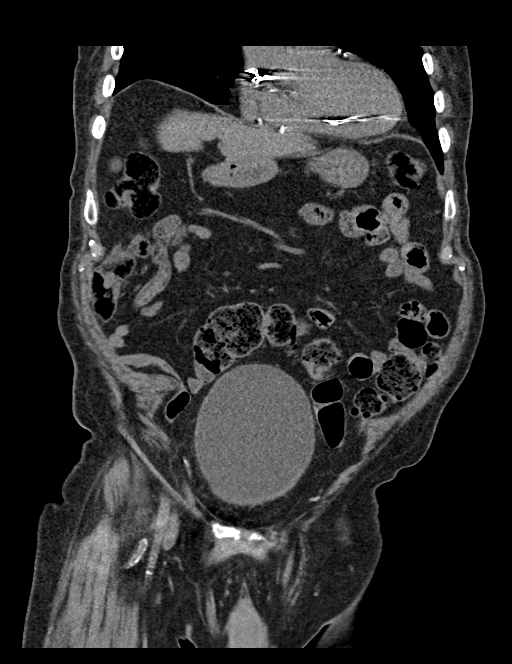
[im 61/138  soft-tissue]
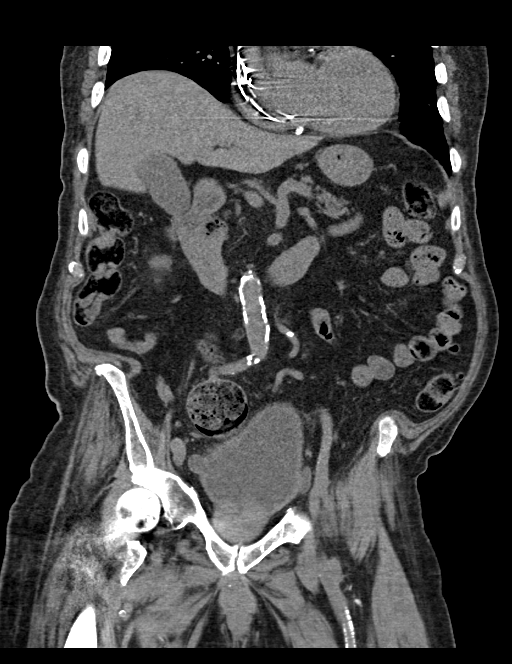
[im 77/138  soft-tissue]
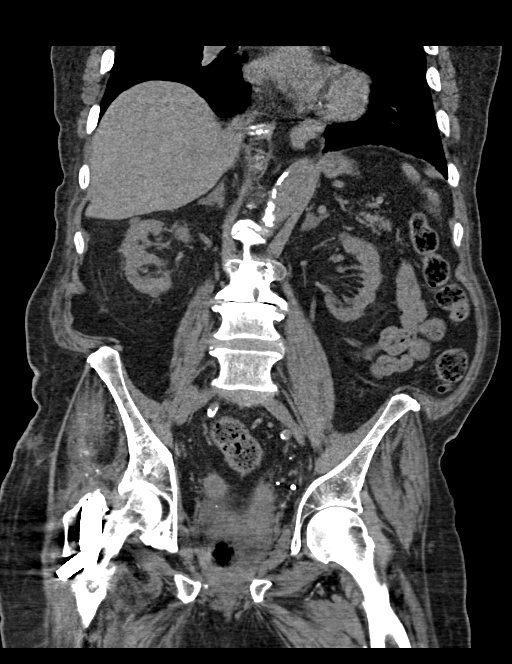

[16 of 46 positions shown; findings below may reference images not displayed]

FINDINGS: Lower chest:  Dual-chamber pacer leads in place.  Atherosclerosis.

Scarring or atelectasis at the lung bases.

Hepatobiliary: No focal liver abnormality.Small volume layering
high-density material in the gallbladder, likely small calculi. No
evidence of acute cholecystitis.

Pancreas: Unremarkable for age

Spleen: Unremarkable.

Adrenals/Urinary Tract: Negative adrenals. Bilateral renal atrophy.
3.1 cm high-density lesion exophytic from the lower right kidney.
This correlates with a mildly complicated cyst on 3276 ultrasound
with mild size enlargement from that time. Other renal lesions on
both sides are cystic density or too small for accurate
densitometry. No hydronephrosis or ureteral calculus. Bilateral
laterally projecting bladder diverticula which generalized wall
thickening of the bladder. Bladder is moderately distended today.

Stomach/Bowel: Negative for bowel obstruction. Moderate stool
retention mainly noted at the distended rectum. No superimposed
rectal inflammation. Rectal diameter is 7.6 cm.

Vascular/Lymphatic: No acute vascular abnormality. No mass or
adenopathy.

Reproductive:Mildly enlarged prostate which has a symmetric
appearance.

Other: No ascites or pneumoperitoneum.

Musculoskeletal: No acute abnormalities. Advanced lumbar disc and
facet degeneration. Intertrochanteric right femur fracture with
signs of early healing. No evident hardware displacement or
loosening.
IMPRESSION: 1. Moderate stool retention, mainly at the distended rectum, without
obstruction or visible inflammation.
2. 3.1 cm right renal lesion which which is likely a hemorrhagic
cyst based on 3276 abdominal ultrasound, with interval enlargement
from that time.
3. Sequela of chronic bladder outlet obstruction with wall
thickening and diverticula. The bladder is moderately distended
today.
4. Cholelithiasis.  No acute cholecystitis.
